# Patient Record
Sex: Female | Born: 1935 | Race: White | Hispanic: No | Marital: Married | Smoking: Never smoker
Health system: Southern US, Community
[De-identification: ages and names within clinical notes are randomized; demographics above are authoritative.]

## PROBLEM LIST (undated history)

## (undated) DIAGNOSIS — Z85820 Personal history of malignant melanoma of skin: Secondary | ICD-10-CM

## (undated) DIAGNOSIS — C439 Malignant melanoma of skin, unspecified: Secondary | ICD-10-CM

## (undated) DIAGNOSIS — I10 Essential (primary) hypertension: Secondary | ICD-10-CM

## (undated) DIAGNOSIS — C4491 Basal cell carcinoma of skin, unspecified: Secondary | ICD-10-CM

## (undated) DIAGNOSIS — R079 Chest pain, unspecified: Secondary | ICD-10-CM

## (undated) DIAGNOSIS — R5381 Other malaise: Secondary | ICD-10-CM

## (undated) DIAGNOSIS — I059 Rheumatic mitral valve disease, unspecified: Secondary | ICD-10-CM

## (undated) DIAGNOSIS — F419 Anxiety disorder, unspecified: Secondary | ICD-10-CM

## (undated) DIAGNOSIS — I7300 Raynaud's syndrome without gangrene: Secondary | ICD-10-CM

## (undated) DIAGNOSIS — IMO0002 Reserved for concepts with insufficient information to code with codable children: Secondary | ICD-10-CM

## (undated) DIAGNOSIS — F329 Major depressive disorder, single episode, unspecified: Secondary | ICD-10-CM

## (undated) DIAGNOSIS — F411 Generalized anxiety disorder: Secondary | ICD-10-CM

## (undated) DIAGNOSIS — I2510 Atherosclerotic heart disease of native coronary artery without angina pectoris: Secondary | ICD-10-CM

## (undated) DIAGNOSIS — R5383 Other fatigue: Secondary | ICD-10-CM

## (undated) DIAGNOSIS — E210 Primary hyperparathyroidism: Secondary | ICD-10-CM

## (undated) DIAGNOSIS — F32A Depression, unspecified: Secondary | ICD-10-CM

## (undated) DIAGNOSIS — I447 Left bundle-branch block, unspecified: Secondary | ICD-10-CM

## (undated) DIAGNOSIS — D649 Anemia, unspecified: Secondary | ICD-10-CM

## (undated) HISTORY — DX: Generalized anxiety disorder: F41.1

## (undated) HISTORY — DX: Left bundle-branch block, unspecified: I44.7

## (undated) HISTORY — DX: Other malaise: R53.81

## (undated) HISTORY — DX: Rheumatic mitral valve disease, unspecified: I05.9

## (undated) HISTORY — DX: Essential (primary) hypertension: I10

## (undated) HISTORY — DX: Anxiety disorder, unspecified: F41.9

## (undated) HISTORY — PX: SQUAMOUS CELL CARCINOMA EXCISION: SHX2433

## (undated) HISTORY — PX: MELANOMA EXCISION: SHX5266

## (undated) HISTORY — DX: Raynaud's syndrome without gangrene: I73.00

## (undated) HISTORY — PX: BASAL CELL CARCINOMA EXCISION: SHX1214

## (undated) HISTORY — DX: Personal history of malignant melanoma of skin: Z85.820

## (undated) HISTORY — DX: Other fatigue: R53.83

## (undated) HISTORY — DX: Chest pain, unspecified: R07.9

---
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
## 1958-12-04 HISTORY — PX: EYE SURGERY: SHX253

## 1998-07-19 ENCOUNTER — Other Ambulatory Visit: Admission: RE | Admit: 1998-07-19 | Discharge: 1998-07-19 | Payer: Self-pay | Admitting: Family Medicine

## 1999-07-25 ENCOUNTER — Other Ambulatory Visit: Admission: RE | Admit: 1999-07-25 | Discharge: 1999-07-25 | Payer: Self-pay | Admitting: Family Medicine

## 2000-02-27 ENCOUNTER — Encounter: Admission: RE | Admit: 2000-02-27 | Discharge: 2000-02-27 | Payer: Self-pay | Admitting: Family Medicine

## 2000-02-27 ENCOUNTER — Encounter: Payer: Self-pay | Admitting: Family Medicine

## 2000-08-20 ENCOUNTER — Other Ambulatory Visit: Admission: RE | Admit: 2000-08-20 | Discharge: 2000-08-20 | Payer: Self-pay | Admitting: Family Medicine

## 2001-06-27 ENCOUNTER — Encounter: Admission: RE | Admit: 2001-06-27 | Discharge: 2001-06-27 | Payer: Self-pay | Admitting: Family Medicine

## 2001-06-27 ENCOUNTER — Encounter: Payer: Self-pay | Admitting: Family Medicine

## 2001-08-26 ENCOUNTER — Other Ambulatory Visit: Admission: RE | Admit: 2001-08-26 | Discharge: 2001-08-26 | Payer: Self-pay | Admitting: Family Medicine

## 2001-09-06 ENCOUNTER — Encounter: Admission: RE | Admit: 2001-09-06 | Discharge: 2001-09-06 | Payer: Self-pay | Admitting: Family Medicine

## 2001-09-06 ENCOUNTER — Encounter: Payer: Self-pay | Admitting: Family Medicine

## 2002-08-29 ENCOUNTER — Other Ambulatory Visit: Admission: RE | Admit: 2002-08-29 | Discharge: 2002-08-29 | Payer: Self-pay | Admitting: Family Medicine

## 2004-09-02 ENCOUNTER — Other Ambulatory Visit: Admission: RE | Admit: 2004-09-02 | Discharge: 2004-09-02 | Payer: Self-pay | Admitting: Family Medicine

## 2004-12-08 ENCOUNTER — Inpatient Hospital Stay (HOSPITAL_COMMUNITY): Admission: EM | Admit: 2004-12-08 | Discharge: 2004-12-09 | Payer: Self-pay | Admitting: *Deleted

## 2004-12-08 DIAGNOSIS — R079 Chest pain, unspecified: Secondary | ICD-10-CM

## 2004-12-08 HISTORY — DX: Chest pain, unspecified: R07.9

## 2004-12-09 ENCOUNTER — Encounter: Payer: Self-pay | Admitting: Internal Medicine

## 2004-12-12 ENCOUNTER — Encounter: Payer: Self-pay | Admitting: Internal Medicine

## 2005-07-14 ENCOUNTER — Encounter: Admission: RE | Admit: 2005-07-14 | Discharge: 2005-07-14 | Payer: Self-pay | Admitting: Family Medicine

## 2005-09-21 ENCOUNTER — Encounter: Admission: RE | Admit: 2005-09-21 | Discharge: 2005-09-21 | Payer: Self-pay | Admitting: Family Medicine

## 2006-09-03 LAB — CONVERTED CEMR LAB: Pap Smear: NEGATIVE

## 2006-09-11 ENCOUNTER — Other Ambulatory Visit: Admission: RE | Admit: 2006-09-11 | Discharge: 2006-09-11 | Payer: Self-pay | Admitting: Family Medicine

## 2006-09-25 ENCOUNTER — Encounter: Admission: RE | Admit: 2006-09-25 | Discharge: 2006-09-25 | Payer: Self-pay | Admitting: Family Medicine

## 2007-09-12 LAB — CONVERTED CEMR LAB
AST: 17 units/L
Albumin: 4.4 g/dL
Basophils Relative: 0 %
CO2: 27 meq/L
Calcium: 10.3 mg/dL
Creatinine, Ser: 0.92 mg/dL
Eosinophils Relative: 3 %
Glucose, Bld: 109 mg/dL
Hemoglobin: 16.4 g/dL
Monocytes Relative: 9 %
Potassium: 4.7 meq/L
RBC: 4.88 M/uL
Total Bilirubin: 0.6 mg/dL
Triglyceride fasting, serum: 163 mg/dL
WBC: 5 10*3/uL

## 2009-01-04 ENCOUNTER — Encounter: Admission: RE | Admit: 2009-01-04 | Discharge: 2009-01-04 | Payer: Self-pay | Admitting: Family Medicine

## 2009-01-13 ENCOUNTER — Encounter: Payer: Self-pay | Admitting: Internal Medicine

## 2009-03-29 ENCOUNTER — Encounter: Payer: Self-pay | Admitting: Internal Medicine

## 2009-03-29 LAB — CONVERTED CEMR LAB
ALT: 11 units/L
Alkaline Phosphatase: 66 units/L
BUN: 17 mg/dL
Basophils Relative: 1 %
CRP: 1.2 mg/dL
Eosinophils Relative: 3 %
HDL: 52 mg/dL
Hgb A1c MFr Bld: 5.9 %
Homocysteine: 14 micromoles/L
LDL (calc): 27 mg/dL
LDL Cholesterol: 127 mg/dL
MCV: 98.5 fL
Monocytes Relative: 9 %
Platelets: 195 10*3/uL
TSH: 2.975 microintl units/mL
Triglyceride fasting, serum: 134 mg/dL

## 2009-04-05 ENCOUNTER — Encounter: Payer: Self-pay | Admitting: Internal Medicine

## 2009-04-12 ENCOUNTER — Encounter: Payer: Self-pay | Admitting: Internal Medicine

## 2009-04-12 ENCOUNTER — Encounter: Admission: RE | Admit: 2009-04-12 | Discharge: 2009-04-12 | Payer: Self-pay | Admitting: Family Medicine

## 2009-12-31 DIAGNOSIS — I059 Rheumatic mitral valve disease, unspecified: Secondary | ICD-10-CM | POA: Insufficient documentation

## 2009-12-31 DIAGNOSIS — I10 Essential (primary) hypertension: Secondary | ICD-10-CM | POA: Insufficient documentation

## 2009-12-31 DIAGNOSIS — F411 Generalized anxiety disorder: Secondary | ICD-10-CM | POA: Insufficient documentation

## 2009-12-31 DIAGNOSIS — H409 Unspecified glaucoma: Secondary | ICD-10-CM

## 2009-12-31 HISTORY — DX: Rheumatic mitral valve disease, unspecified: I05.9

## 2009-12-31 HISTORY — DX: Generalized anxiety disorder: F41.1

## 2009-12-31 HISTORY — DX: Essential (primary) hypertension: I10

## 2010-01-03 ENCOUNTER — Ambulatory Visit: Payer: Self-pay | Admitting: Internal Medicine

## 2010-01-03 DIAGNOSIS — I447 Left bundle-branch block, unspecified: Secondary | ICD-10-CM | POA: Insufficient documentation

## 2010-01-03 DIAGNOSIS — Z85820 Personal history of malignant melanoma of skin: Secondary | ICD-10-CM

## 2010-01-03 HISTORY — DX: Left bundle-branch block, unspecified: I44.7

## 2010-01-03 HISTORY — DX: Personal history of malignant melanoma of skin: Z85.820

## 2010-01-07 ENCOUNTER — Ambulatory Visit: Payer: Self-pay | Admitting: Internal Medicine

## 2010-01-07 LAB — CONVERTED CEMR LAB
AST: 19 units/L (ref 0–37)
Alkaline Phosphatase: 51 units/L (ref 39–117)
BUN: 14 mg/dL (ref 6–23)
Basophils Relative: 1 % (ref 0–1)
Bilirubin, Direct: 0.1 mg/dL (ref 0.0–0.3)
CO2: 30 meq/L (ref 19–32)
Creatinine, Ser: 0.8 mg/dL (ref 0.4–1.2)
Direct LDL: 153.9 mg/dL
Eosinophils Absolute: 0.2 10*3/uL (ref 0.0–0.7)
Eosinophils Relative: 3 % (ref 0–5)
GFR calc non Af Amer: 72.57 mL/min (ref >60)
HCT: 36.7 % (ref 36.0–46.0)
HDL: 44 mg/dL (ref >39.00)
Hemoglobin: 11.3 g/dL — ABNORMAL LOW (ref 12.0–15.0)
Lymphocytes Relative: 24 % (ref 12–46)
MCHC: 30.8 g/dL (ref 30.0–36.0)
Monocytes Absolute: 0.6 10*3/uL (ref 0.1–1.0)
Monocytes Relative: 11 % (ref 3–12)
Neutrophils Relative %: 62 % (ref 43–77)
Nitrite: NEGATIVE
Potassium: 4.5 meq/L (ref 3.5–5.1)
RBC: 3.58 M/uL — ABNORMAL LOW (ref 3.87–5.11)
RDW: 16.5 % — ABNORMAL HIGH (ref 11.5–15.5)
Sodium: 141 meq/L (ref 135–145)
TSH: 2.78 microintl units/mL (ref 0.35–5.50)
Total Bilirubin: 0.8 mg/dL (ref 0.3–1.2)
Total Protein: 7.4 g/dL (ref 6.0–8.3)
Urobilinogen, UA: 0.2 (ref 0.0–1.0)
WBC: 5.8 10*3/uL (ref 4.0–10.5)

## 2010-01-28 ENCOUNTER — Telehealth: Payer: Self-pay | Admitting: Internal Medicine

## 2010-12-02 ENCOUNTER — Ambulatory Visit
Admission: RE | Admit: 2010-12-02 | Discharge: 2010-12-02 | Payer: Self-pay | Source: Home / Self Care | Attending: Internal Medicine | Admitting: Internal Medicine

## 2010-12-02 DIAGNOSIS — N764 Abscess of vulva: Secondary | ICD-10-CM

## 2011-01-03 NOTE — Assessment & Plan Note (Signed)
Summary: NEW MEDICARE PT-JAN APPT-PKG--STC   Vital Signs:  Patient profile:   77 year old female Height:      66 inches (167.64 cm) Weight:      160.8 pounds (73.09 kg) O2 Sat:      97 % on Room air Temp:     98.2 degrees F (36.78 degrees C) oral Pulse rate:   76 / minute BP sitting:   144 / 78  (left arm) Cuff size:   regular  Vitals Entered By: Orlan Leavens (January 03, 2010 9:29 AM)  O2 Flow:  Room air CC: New patient/ transferring from dr. Artis Flock Is Patient Diabetic? No Pain Assessment Patient in pain? no        Primary Care Provider:  Newt Lukes MD  CC:  New patient/ transferring from dr. Artis Flock.  History of Present Illness: nw pt to me and our practice, here to est care patient is here today for annual physical. Patient feels well and has no complaints.   Preventive Screening-Counseling & Management  Alcohol-Tobacco     Alcohol drinks/day: <1     Alcohol Counseling: not indicated; use of alcohol is not excessive or problematic     Smoking Status: never     Tobacco Counseling: not indicated; no tobacco use  Caffeine-Diet-Exercise     Does Patient Exercise: no     Exercise Counseling: to improve exercise regimen     Depression Counseling: not indicated; screening negative for depression  Safety-Violence-Falls     Seat Belt Use: yes     Firearms in the Home: no firearms in the home     Smoke Detectors: yes     Violence in the Home: no risk noted     Fall Risk Counseling: not indicated; no significant falls noted  Clinical Review Panels:  Prevention   Last Mammogram:  [facility].   No specific mammographic evidence of malignancy.  Assessment: BIRADS 1. (04/12/2009)   Last Pap Smear:  Interpretation/Result:Negative for intraepithelial Lesion or Malignancy.    (09/03/2006)  Immunizations   Last Tetanus Booster:  Historical (08/05/2003)   Last Flu Vaccine:  Historical (09/03/2009)   Last Pneumovax:  Historical  (09/03/1992)   Last Zoster Vaccine:  Zostavax (05/05/2007)  Lipid Management   Cholesterol:  206 (03/29/2009)   LDL (bad choesterol):  127 (03/29/2009)   HDL (good cholesterol):  52 (03/29/2009)   Triglycerides:  134 (03/29/2009)  CBC   WBC:  6.3 (03/29/2009)   RBC:  4.75 (03/29/2009)   Hgb:  16.2 (03/29/2009)   Hct:  46.8 (03/29/2009)   Platelets:  195 (03/29/2009)   MCV  98.5 (03/29/2009)   RDW  14.9 (03/29/2009)   PMN:  64 (03/29/2009)   Monos:  9 (03/29/2009)   Eosinophils:  3 (03/29/2009)   Basophil:  1 (03/29/2009)  Complete Metabolic Panel   Glucose:  105 (03/29/2009)   Sodium:  140 (03/29/2009)   Potassium:  4.2 (03/29/2009)   Chloride:  104 (03/29/2009)   CO2:  27 (03/29/2009)   BUN:  17 (03/29/2009)   Creatinine:  0.91 (03/29/2009)   Albumin:  4.5 (03/29/2009)   Total Protein:  7.6 (03/29/2009)   Calcium:  10.0 (03/29/2009)   Total Bili:  0.5 (03/29/2009)   Alk Phos:  66 (03/29/2009)   SGPT (ALT):  11 (03/29/2009)   SGOT (AST):  16 (03/29/2009)   -  Date:  03/29/2009    WBC: 6.3    HGB: 16.2    HCT: 46.8  RBC: 4.75    PLT: 195    MCV: 98.5    RDW: 14.9    Neutrophil: 64    Lymphs: 23    Monos: 9    Eos: 3    Basophil: 1    BG Random: 105    BUN: 17    Creatinine: 0.91    Sodium: 140    Potassium: 4.2    Chloride: 104    CO2 Total: 27    SGOT (AST): 16    SGPT (ALT): 11    T. Bilirubin: 0.5    Alk Phos: 66    Calcium: 10.0    Total Protein: 7.6    Albumin: 4.5    Cholesterol: 206    LDL: 127    LDL-calculated: 27    HDL: 52    Triglycerides: 562    TSH: 2.975    C-reactive protein: 1.2    Homocysteine: 14.0    HgbA1c: 5.9  Date:  09/12/2007    WBC: 5.0    HGB: 16.4    HCT: 47.9    RBC: 4.88    PLT: 187    MCV: 98.2    RDW: 13.9    Neutrophil: 61    Lymphs: 28    Monos: 9    Eos: 3    Basophil: 0    BG Random: 109    BUN: 16    Creatinine: 0.92    Sodium: 140    Potassium: 4.7    Chloride: 104    CO2 Total:  27    SGOT (AST): 17    SGPT (ALT): 13    T. Bilirubin: 0.6    Alk Phos: 63    Calcium: 10.3    Total Protein: 7.4    Albumin: 4.4    Cholesterol: 214    LDL: 128    LDL-calculated: 33    HDL: 53    Triglycerides: 130    TSH: 2.684  Current Medications (verified): 1)  Norvasc 5 Mg Tabs (Amlodipine Besylate) .... Take 1 By Mouth Q Am 2)  Lisinopril-Hydrochlorothiazide 20-25 Mg Tabs (Lisinopril-Hydrochlorothiazide) .... Take 1 By Mouth Qd 3)  Lisinopril-Hydrochlorothiazide 10-12.5 Mg Tabs (Lisinopril-Hydrochlorothiazide) .... Take 1 Q Am 4)  Lumigan 0.03 % Soln (Bimatoprost) .[name] Kitchen.. 1 Drop in Each Eye Q Am 5)  Fish Oil 1000 Mg Caps (Omega-3 Fatty Acids) .... Once Daily 6)  Vitamin E 1000 Unit Caps (Vitamin E) .... Once Daily 7)  Dorzolamide Hcl-Timolol Mal 22.3-6.8 Mg/ml Soln (Dorzolamide Hcl-Timolol Mal) .[name] Kitchen.. 1 Drop in Each Eye Q 12 Hours 8)  Vitamin D3 1000 Unit Tabs (Cholecalciferol) .... Once Daily 9)  Grape Seed 50 Mg Caps (Grape Seed) .... Take 2 By Mouth Qd 10)  Verelan 120 Mg Xr24h-Cap (Verapamil Hcl) .... Take 1 At Bedtime  Allergies (verified): 1)  ! Codeine  Past History:  Past Medical History: Anxiety Hypertension glaucoma  MD rooster: cards Tresa Endo (remote -2006 cath) optho [facility]  Past Surgical History: Tonsillectomy (1934) Breast biopsy (1950) eye surgery (1960)  Family History: Family History Hypertension (parent) Family History Kidney disease (father) Heart disease (grandparent, mother relative)  mother died at age 27.  father dies at 37 from bright disease.  No living siblings. She had one brother die from lung cancer and one sister died in her 18's from heart attack.  Social History: Never Smoked married, lives with spouse (2nd marriage in 2009, 1st widowed in 1979) enjoys golf, not much aerobic activity- spends time in  [place] part of the year Smoking Status:  never Does Patient Exercise:  no Seat Belt Use:  yes  Review of Systems        see HPI above. I have reviewed all other systems and they were negative.   Physical Exam  General:  alert, well-developed, well-nourished, and cooperative to examination.   active and spry for age Eyes:  vision grossly intact; pupils equal, round and reactive to light.  conjunctiva and lids normal.    Ears:  normal pinnae bilaterally, without erythema, swelling, or tenderness to palpation. TMs clear, without effusion, or cerumen impaction. Hearing grossly normal bilaterally  Mouth:  teeth and gums in good repair; mucous membranes moist, without lesions or ulcers. oropharynx clear without exudate, no erythema.  Lungs:  normal respiratory effort, no intercostal retractions or use of accessory muscles; normal breath sounds bilaterally - no crackles and no wheezes.    Heart:  normal rate, regular rhythm, no murmur, and no rub. BLE without edema.  Abdomen:  soft, non-tender, normal bowel sounds, no distention; no masses and no appreciable hepatomegaly or splenomegaly.   Rectal:  No external abnormalities noted. Normal sphincter tone. No rectal masses or tenderness. Heme neg FOB x 1 Msk:  No deformity or scoliosis noted of thoracic or lumbar spine.   Neurologic:  alert & oriented X3 and cranial nerves II-XII symetrically intact.  strength normal in all extremities, sensation intact to light touch, and gait normal. speech fluent without dysarthria or aphasia; follows commands with good comprehension.  Skin:  no rashes, vesicles, ulcers, or erythema. No nodules or irregularity to palpation.  Psych:  Oriented X3, memory intact for recent and remote, normally interactive, good eye contact, not anxious appearing, not depressed appearing, and not agitated.      Impression & Recommendations:  Problem # 1:  PREVENTIVE HEALTH CARE (ICD-V70.0)  Patient has been counseled on age-appropriate routine health concerns for screening and prevention. These are reviewed and up-to-date. Immunizations are up-to-date  or declined. Labs ordered and to be reviewed. ECG and colo declined  Orders: Hemoccult Guaiac-1 spec.(in office) (82270)  Problem # 2:  HYPERTENSION (ICD-401.9)  The following medications were removed from the medication list:    Lisinopril-hydrochlorothiazide 20-25 Mg Tabs (Lisinopril-hydrochlorothiazide) .[name] Kitchen... Take 1 by mouth qd Her updated medication list for this problem includes:    Verelan 120 Mg Xr24h-cap (Verapamil hcl) .[name] Kitchen... Take 1 at bedtime    Norvasc 5 Mg Tabs (Amlodipine besylate) .[name] Kitchen... Take 1 by mouth q am    Lisinopril-hydrochlorothiazide 10-12.5 Mg Tabs (Lisinopril-hydrochlorothiazide) .[name] Kitchen... Take 1 by mouth once daily  BP today: 144/78  Complete Medication List: 1)  Verelan 120 Mg Xr24h-cap (Verapamil hcl) .... Take 1 at bedtime 2)  Norvasc 5 Mg Tabs (Amlodipine besylate) .... Take 1 by mouth q am 3)  Lisinopril-hydrochlorothiazide 10-12.5 Mg Tabs (Lisinopril-hydrochlorothiazide) .... Take 1 by mouth once daily 4)  Lumigan 0.03 % Soln (Bimatoprost) .[name] Kitchen.. 1 drop in each eye q am 5)  Fish Oil 1000 Mg Caps (Omega-3 fatty acids) .... Once daily 6)  Vitamin E 1000 Unit Caps (Vitamin e) .... Once daily 7)  Dorzolamide Hcl-timolol Mal 22.3-6.8 Mg/ml Soln (Dorzolamide hcl-timolol mal) .[name] Kitchen.. 1 drop in each eye q 12 hours 8)  Vitamin D3 1000 Unit Tabs (Cholecalciferol) .... Once daily 9)  Grape Seed 50 Mg Caps (Grape seed) .... Take 2 by mouth qd 10)  Align Caps (Probiotic product) .[name] Kitchen.. 1 by mouth once daily  Contraindications/Deferment of Procedures/Staging:    Test/Procedure: Colonoscopy  Reason for deferment: patient declined   Patient Instructions: 1)  it was good to see you today.  2)  please return for physical labs when you are fasting - your results wil thenl be posted on the phone tree for review in 48-72 hours from the time of test completion; call [phone] and enter your 9 digit MRN (listed above on this page, just below your name); if any changes need to be  made or there are abnormal results, you will be contacted directly.  3)  it is important that you work on losing weight - monitor your diet and consume fewer calories such as less carbohydrates (sugar) and less fat. you also need to increase your physical activity level - start by walking for 10-20 minutes 3 times per week and work up to 30 minutes 4-5 times each week.  4)  Please schedule a follow-up appointment annually for your physical, call sooner if problems.    Immunization History:  Tetanus/Td Immunization History:    Tetanus/Td:  historical (08/05/2003)  Influenza Immunization History:    Influenza:  historical (09/03/2009)  Pneumovax Immunization History:    Pneumovax:  historical (09/03/1992)  Zostavax History:    Zostavax # 1:  zostavax (05/05/2007)    Mammogram  Procedure date:  09/03/2006  Findings:      No specific mammographic evidence of malignancy.    Bone Density  Procedure date:  09/03/2005  Findings:      results: Osteopenia  Pap Smear  Procedure date:  09/03/2006  Findings:      Interpretation/Result:Negative for intraepithelial Lesion or Malignancy.     Bone Density  Procedure date:  04/12/2009  Findings:      Location:  [facility].   Lumbar spine BMD 0.937 T-score -1.0 Z-score- 1.8  Neck BMD 0.713 T-score--1.2 Z-score- 1.2  assessment: Low bone mass   Mammogram  Procedure date:  04/12/2009  Findings:      Location: [facility].   No specific mammographic evidence of malignancy.  Assessment: BIRADS 1.   MISC. Report  Procedure date:  04/05/2009  Findings:      Type of Report: Pulmonary Function test. Impression: Normal spirometry   Exercise Stress Test  Procedure date:  12/12/2004  Findings:      done @ [facility]  Impression: Relatively normal persantine cardiolite myocardial perfusion and functional/dynamic study. there is evidence for minimal  distal inferolateral thinning which is not felt to be significant. There is vigorous LV contractility without wall motion abnormalties. No region of ischemia. there is a low risk scan

## 2011-01-03 NOTE — Progress Notes (Signed)
  Phone Note From Pharmacy   Caller: [facility] Summary of Call: pharmacy called stating that pt just got last refills of Lisinopril, Amplodipine and Verapamil Rx'd from Dr. Artis Flock. pt is requesting MD renew Rx  Initial call taken by: Margaret Pyle, CMA,  January 28, 2010 11:32 AM    Prescriptions: NORVASC 5 MG TABS (AMLODIPINE BESYLATE) take 1 by mouth q am  #90 x 3   Entered by:   Margaret Pyle, CMA   Authorized by:   Newt Lukes MD   Signed by:   Margaret Pyle, CMA on 01/28/2010   Method used:   Electronically to        [facility] (retail)       [address]       [place], [place]  [id number]       Ph: [phone]       Fax: [phone]   RxID:   [id number] LISINOPRIL-HYDROCHLOROTHIAZIDE 10-12.5 MG TABS (LISINOPRIL-HYDROCHLOROTHIAZIDE) take 1 by mouth once daily  #90 x 3   Entered by:   Margaret Pyle, CMA   Authorized by:   Newt Lukes MD   Signed by:   Margaret Pyle, CMA on 01/28/2010   Method used:   Electronically to        [facility] (retail)       [address]       [place], [place]  [id number]       Ph: [phone]       Fax: [phone]   RxID:   [id number] VERELAN 120 MG XR24H-CAP (VERAPAMIL HCL) take 1 at bedtime  #90 x 3   Entered by:   Margaret Pyle, CMA   Authorized by:   Newt Lukes MD   Signed by:   Margaret Pyle, CMA on 01/28/2010   Method used:   Electronically to        [facility] (retail)       [address]       [place], [place]  [id number]       Ph: [phone]       Fax: [phone]   RxID:   [id number]

## 2011-01-03 NOTE — Therapy (Signed)
Summary: Bradd Canary MD  Bradd Canary MD   Imported By: Lester Primrose 01/06/2010 08:05:16  _____________________________________________________________________  External Attachment:    Type:   Image     Comment:   External Document

## 2011-01-03 NOTE — Cardiovascular Report (Signed)
Summary: Judene Companion MD  Judene Companion MD   Imported By: Lester [place]port 01/06/2010 07:51:32  _____________________________________________________________________  External Attachment:    Type:   Image     Comment:   External Document

## 2011-01-05 NOTE — Assessment & Plan Note (Signed)
Summary: lump groin area/cd   Vital Signs:  Patient profile:   77 year old female Height:      66 inches Weight:      159.50 pounds BMI:     25.84 O2 Sat:      97 % on Room air Temp:     98.4 degrees F oral Pulse rate:   78 / minute BP sitting:   138 / 82  (left arm) Cuff size:   regular  Vitals Entered By: Margaret Pyle, CMA (December 02, 2010 11:07 AM)  O2 Flow:  Room air  Procedure Note Last Tetanus: Historical (08/05/2003)  Incision & Drainage: The patient complains of pain, redness, inflammation, tenderness, swelling, and changing lesion but denies foreign body, discharge, and fever. Date of onset: 11/28/2010 Indication: infected lesion Consent signed: no  Procedure # 1: I & D with packing    Size (in cm): 1.2 x 1.1    Region: left vulva    Comment: pt elects to proceed after verbal consent obtained. pt informed of possible risks and complications. using sterile technique throughout, anesthesia achieved and I&D of abscess performed. copious amounts of purulent material expressed, followed by thorough irrigation of wound. iodoform packing placed into wound. pt tolerated procedure well. wound care instructions given.     Instrument used: #15 blade    Anesthesia: 2.0 ml 2% lidocaine w/o epinephrine  CC: Boil, LT upper/inner thigh Pain Assessment Patient in pain? no        Primary Care Provider:  Newt Lukes MD  CC:  Boil and LT upper/inner thigh.  History of Present Illness: c/o nodule in left inner thigh, ?boil onset 3 days ago (?when 1st noted but may have been present longer) a/w tenderness to touch, no drainage, no fever reports inc in size since onset noted no hx same - no shaving in this area reports she is unable to tolerate oral abx due to severe nausea  Allergies: 1)  ! Codeine  Past History:  Past Medical History: Anxiety Hypertension glaucoma  MD roster: cards - Tresa Endo (remote -2006 cath) optho - [place]  Family  History: Family History Hypertension (parent) Family History Kidney disease (father) Heart disease (grandparent, mother relative)   mother died at age 42.  father dies at 57 from bright disease.  No living siblings. She had one brother die from lung cancer and one sister died in her 72's from heart attack.  Social History: Never Smoked married, lives with spouse (2nd marriage in 2009, 1st widowed in 1979) enjoys golf, not much aerobic activity-  spends time in [place] part of the year  Review of Systems  The patient denies fever, weight loss, hematuria, muscle weakness, and depression.    Physical Exam  General:  alert, well-developed, well-nourished, and cooperative to examination.   active and spry for age Lungs:  normal respiratory effort, no intercostal retractions or use of accessory muscles; normal breath sounds bilaterally - no crackles and no wheezes.    Heart:  normal rate, regular rhythm, no murmur, and no rub. BLE without edema.  Skin:  small abscess 1cm nodule on left vulva - no tract or cellulitis - other skin benign   Impression & Recommendations:  Problem # 1:  ABSCESS, VULVA (ICD-616.4)  i&d today due to pt reported intol of abx - rx for septra provided and encouraged to use as tol -  otherwise wound care instructions for i&d as routine provided  Orders: I&D Abscess, Complex (10061)  Problem # 2:  HYPERTENSION (ICD-401.9)  Her updated medication list for this problem includes:    Verelan 120 Mg Xr24h-cap (Verapamil hcl) .[name] Kitchen... Take 1 at bedtime    Norvasc 5 Mg Tabs (Amlodipine besylate) .[name] Kitchen... Take 1 by mouth q am    Lisinopril-hydrochlorothiazide 10-12.5 Mg Tabs (Lisinopril-hydrochlorothiazide) .[name] Kitchen... Take 1 by mouth once daily  BP today: 138/82 Prior BP: 144/78 (01/03/2010)  Labs Reviewed: K+: 4.5 (01/07/2010) Creat: : 0.8 (01/07/2010)   Chol: 216 (01/07/2010)   HDL: 44.00 (01/07/2010)   LDL: 27 (03/29/2009)   TG: 139.0 (01/07/2010)  Complete  Medication List: 1)  Verelan 120 Mg Xr24h-cap (Verapamil hcl) .... Take 1 at bedtime 2)  Norvasc 5 Mg Tabs (Amlodipine besylate) .... Take 1 by mouth q am 3)  Lisinopril-hydrochlorothiazide 10-12.5 Mg Tabs (Lisinopril-hydrochlorothiazide) .... Take 1 by mouth once daily 4)  Lumigan 0.03 % Soln (Bimatoprost) .[name] Kitchen.. 1 drop in each eye q am 5)  Dorzolamide Hcl-timolol Mal 22.3-6.8 Mg/ml Soln (Dorzolamide hcl-timolol mal) .[name] Kitchen.. 1 drop in each eye q 12 hours 6)  Grape Seed 50 Mg Caps (Grape seed) .... Take 2 by mouth qd 7)  Septra Ds 800-160 Mg Tabs (Sulfamethoxazole-trimethoprim) .[name] Kitchen.. 1 by mouth two times a day x 5 days  Patient Instructions: 1)  it was good to see you today. 2)  your boil has been opened, drained, cleaned and packed - remove packing in 24 hours 3)  keep area washed with warm soap and water 2x/day after removal of packing until healed -  4)  septra antibioitcs 2x/d for 5days - prescription given to you today - TAKE WITH FOOD 5)  if any increased pain, drainage, redness or other problems, call us - may need reevaluation or antibioitcs 6)  Please schedule a follow-up appointment as needed (keep scheduled physical appt in Feb 2012 as planned), call sooner if problems Prescriptions: SEPTRA DS 800-160 MG TABS (SULFAMETHOXAZOLE-TRIMETHOPRIM) 1 by mouth two times a day x 5 days  #10 x 0   Entered and Authorized by:   Newt Lukes MD   Signed by:   Newt Lukes MD on 12/02/2010   Method used:   Print then Give to Patient   RxID:   [id number]    Orders Added: 1)  Est. Patient Level IV [14782] 2)  I&D Abscess, Complex [10061]   Immunization History:  Influenza Immunization History:    Influenza:  historical (08/04/2010)   Immunization History:  Influenza Immunization History:    Influenza:  Historical (08/04/2010)

## 2011-01-09 ENCOUNTER — Encounter: Payer: Self-pay | Admitting: Internal Medicine

## 2011-01-09 ENCOUNTER — Other Ambulatory Visit: Payer: Medicare Other

## 2011-01-09 ENCOUNTER — Ambulatory Visit (INDEPENDENT_AMBULATORY_CARE_PROVIDER_SITE_OTHER): Payer: Medicare Other | Admitting: Internal Medicine

## 2011-01-09 ENCOUNTER — Other Ambulatory Visit: Payer: Self-pay | Admitting: Internal Medicine

## 2011-01-09 DIAGNOSIS — R5381 Other malaise: Secondary | ICD-10-CM | POA: Insufficient documentation

## 2011-01-09 DIAGNOSIS — R5383 Other fatigue: Secondary | ICD-10-CM

## 2011-01-09 DIAGNOSIS — I10 Essential (primary) hypertension: Secondary | ICD-10-CM

## 2011-01-09 DIAGNOSIS — F411 Generalized anxiety disorder: Secondary | ICD-10-CM

## 2011-01-09 DIAGNOSIS — Z0000 Encounter for general adult medical examination without abnormal findings: Secondary | ICD-10-CM

## 2011-01-09 DIAGNOSIS — E785 Hyperlipidemia, unspecified: Secondary | ICD-10-CM

## 2011-01-09 DIAGNOSIS — I447 Left bundle-branch block, unspecified: Secondary | ICD-10-CM

## 2011-01-09 HISTORY — DX: Other malaise: R53.81

## 2011-01-09 LAB — BASIC METABOLIC PANEL
BUN: 15 mg/dL (ref 6–23)
Calcium: 10.4 mg/dL (ref 8.4–10.5)
GFR: 68.43 mL/min (ref >60.00)

## 2011-01-09 LAB — CBC WITH DIFFERENTIAL/PLATELET
Hemoglobin: 14.4 g/dL (ref 12.0–15.0)
Lymphocytes Relative: 20.4 % (ref 12.0–46.0)
MCHC: 34.4 g/dL (ref 30.0–36.0)
Neutro Abs: 5.1 10*3/uL (ref 1.4–7.7)
WBC: 7.4 10*3/uL (ref 4.5–10.5)

## 2011-01-09 LAB — HEPATIC FUNCTION PANEL
Bilirubin, Direct: 0.1 mg/dL (ref 0.0–0.3)
Total Protein: 7.1 g/dL (ref 6.0–8.3)

## 2011-01-09 LAB — LIPID PANEL
HDL: 49.1 mg/dL (ref >39.00)
Triglycerides: 101 mg/dL (ref 0.0–149.0)
VLDL: 20.2 mg/dL (ref 0.0–40.0)

## 2011-01-09 LAB — LDL CHOLESTEROL, DIRECT: Direct LDL: 147 mg/dL

## 2011-01-11 ENCOUNTER — Other Ambulatory Visit: Payer: Self-pay | Admitting: Internal Medicine

## 2011-01-11 DIAGNOSIS — Z1231 Encounter for screening mammogram for malignant neoplasm of breast: Secondary | ICD-10-CM

## 2011-01-19 NOTE — Assessment & Plan Note (Signed)
Summary: yearly stc   Vital Signs:  Patient profile:   77 year old female Height:      66 inches (167.64 cm) Weight:      159.0 pounds (72.27 kg) O2 Sat:      97 % on Room air Temp:     97.8 degrees F (36.56 degrees C) oral Pulse rate:   72 / minute BP sitting:   132 / 70  (left arm) Cuff size:   regular  Vitals Entered By: Orlan Leavens RMA (January 09, 2011 9:49 AM)  O2 Flow:  Room air CC: yearly f/u Is Patient Diabetic? No Pain Assessment Patient in pain? no        Primary Care Provider:  Newt Lukes MD  CC:  yearly f/u.  History of Present Illness: patient is here today for annual wellness visit. Patient feels well and has no complaints.  chronic LBBB - no change  also reviewed chronic med issues: HTN - reports compliance with ongoing medical treatment and no changes in medication dose or frequency. denies adverse side effects related to current therapy.   anxiety - occ falres but controls symptoms by staying active - no counseling or recent stressors  Preventive Screening-Counseling & Management  Alcohol-Tobacco     Alcohol drinks/day: <1     Alcohol Counseling: not indicated; use of alcohol is not excessive or problematic     Smoking Status: never     Tobacco Counseling: not indicated; no tobacco use  Caffeine-Diet-Exercise     Does Patient Exercise: no     Exercise Counseling: to improve exercise regimen     Depression Counseling: not indicated; screening negative for depression  Safety-Violence-Falls     Seat Belt Use: yes     Firearms in the Home: no firearms in the home     Smoke Detectors: yes     Violence in the Home: no risk noted     Fall Risk Counseling: not indicated; no significant falls noted  Clinical Review Panels:  Prevention   Last Mammogram:  [facility].   No specific mammographic evidence of malignancy.  Assessment: BIRADS 1. (04/12/2009)   Last Pap Smear:  Interpretation/Result:Negative for  intraepithelial Lesion or Malignancy.    (09/03/2006)  Immunizations   Last Tetanus Booster:  Historical (08/05/2003)   Last Flu Vaccine:  Historical (08/04/2010)   Last Pneumovax:  Historical (09/03/1992)   Last Zoster Vaccine:  Zostavax (05/05/2007)  Lipid Management   Cholesterol:  216 (01/07/2010)   LDL (bad choesterol):  127 (03/29/2009)   HDL (good cholesterol):  44.00 (01/07/2010)   Triglycerides:  134 (03/29/2009)  Diabetes Management   HgBA1C:  5.9 (03/29/2009)   Creatinine:  0.8 (01/07/2010)   Last Flu Vaccine:  Historical (08/04/2010)   Last Pneumovax:  Historical (09/03/1992)  CBC   WBC:  5.8 (01/07/2010)   RBC:  3.58 (01/07/2010)   Hgb:  11.3 (01/07/2010)   Hct:  36.7 (01/07/2010)   Platelets:  246 (01/07/2010)   MCV  102.5 (01/07/2010)   MCHC  30.8 (01/07/2010)   RDW  16.5 (01/07/2010)   PMN:  62 (01/07/2010)   Lymphs:  24 (01/07/2010)   Monos:  11 (01/07/2010)   Eosinophils:  3 (01/07/2010)   Basophil:  1 (01/07/2010)  Complete Metabolic Panel   Glucose:  96 (01/07/2010)   Sodium:  141 (01/07/2010)   Potassium:  4.5 (01/07/2010)   Chloride:  106 (01/07/2010)   CO2:  30 (01/07/2010)   BUN:  14 (01/07/2010)   Creatinine:  0.8 (01/07/2010)   Albumin:  3.8 (01/07/2010)   Total Protein:  7.4 (01/07/2010)   Calcium:  10.0 (01/07/2010)   Total Bili:  0.8 (01/07/2010)   Alk Phos:  51 (01/07/2010)   SGPT (ALT):  20 (01/07/2010)   SGOT (AST):  19 (01/07/2010)   Current Medications (verified): 1)  Verelan 120 Mg Xr24h-Cap (Verapamil Hcl) .... Take 1 At Bedtime 2)  Norvasc 5 Mg Tabs (Amlodipine Besylate) .... Take 1 By Mouth Q Am 3)  Lisinopril-Hydrochlorothiazide 10-12.5 Mg Tabs (Lisinopril-Hydrochlorothiazide) .... Take 1 By Mouth Once Daily 4)  Lumigan 0.03 % Soln (Bimatoprost) .[name] Kitchen.. 1 Drop in Each Eye Q Am 5)  Dorzolamide Hcl-Timolol Mal 22.3-6.8 Mg/ml Soln (Dorzolamide Hcl-Timolol Mal) .[name] Kitchen.. 1 Drop in Each Eye Q 12 Hours 6)  Grape Seed 50 Mg Caps  (Grape Seed) .... Take 2 By Mouth Qd  Allergies (verified): 1)  ! Codeine  Past History:  Past Medical History: Anxiety Hypertension glaucoma  MD roster: cards Tresa Endo (remote -2006 cath) optho Eulah Pont    Past Surgical History: Tonsillectomy (1934) Breast biopsy (1950)  eye surgery (1960)  Family History: Family History Hypertension (parent) Family History Kidney disease (father) Heart disease (grandparent, mother relative)    mother died at age 8.  father dies at 66 from bright disease.  No living siblings. She had one brother die from lung cancer and one sister died in her 60's from heart attack.  Social History: Never Smoked  married, lives with spouse (2nd marriage in 2009, 1st widowed in 1979) enjoys golf, not much aerobic activity-  spends time in [place] part of the year  Review of Systems       feels tired, nonexertional not motivationsal - (fatigue), no weight loss, no CP, no SOB no weakness or falls - otherwise, see HPI above. I have reviewed all other systems and they were negative.   Physical Exam  General:  alert, well-developed, well-nourished, and cooperative to examination.   active and spry for age Head:  Normocephalic and atraumatic without obvious abnormalities. No apparent alopecia or balding. Eyes:  vision grossly intact; pupils equal, round and reactive to light.  conjunctiva and lids normal.    Ears:  normal pinnae bilaterally, without erythema, swelling, or tenderness to palpation. TMs clear, without effusion, or cerumen impaction. Hearing grossly normal bilaterally  Mouth:  teeth and gums in good repair; mucous membranes moist, without lesions or ulcers. oropharynx clear without exudate, no erythema.  Neck:  supple, full ROM, no masses, no thyromegaly; no thyroid nodules or tenderness. no JVD or carotid bruits.   Breasts:  No mass, nodules, thickening, tenderness, bulging, retraction, inflamation, nipple discharge or skin changes noted.     Lungs:  normal respiratory effort, no intercostal retractions or use of accessory muscles; normal breath sounds bilaterally - no crackles and no wheezes.    Heart:  normal rate, regular rhythm, no murmur, and no rub. BLE without edema.  Abdomen:  soft, non-tender, normal bowel sounds, no distention; no masses and no appreciable hepatomegaly or splenomegaly.   Genitalia:  defer due to age Msk:  No deformity or scoliosis noted of thoracic or lumbar spine.   Neurologic:  alert & oriented X3 and cranial nerves II-XII symetrically intact.  strength normal in all extremities, sensation intact to light touch, and gait normal. speech fluent without dysarthria or aphasia; follows commands with good comprehension.  Skin:  no rashes, vesicles, ulcers, or erythema. No nodules or irregularity to  palpation.  Psych:  Oriented X3, memory intact for recent and remote, normally interactive, good eye contact, not anxious appearing, not depressed appearing, and not agitated.      Impression & Recommendations:  Problem # 1:  PREVENTIVE HEALTH CARE (ICD-V70.0)  I have personally reviewed and have noted 1.   The patient's medical and social history 2.   Their use of alcohol, tobacco or illicit drugs 3.   Their current medications and supplements 4.   The patient's functional ability including ADL's, fall risks, home safety risks and hearing or visual impairment. 5.   Diet and physical activities 6.   Evidence for depression or mood disorders  The patients weight, height, BMI and visual acuity have been recorded in the chart I have made referrals, counseling and provided education to the patient based review of the above and I have provided the pt with a written personalized care plan for preventive services.    Orders: Misc. Referral (Misc. Ref)  Problem # 2:  HYPERTENSION (ICD-401.9)  Her updated medication list for this problem includes:    Verelan 120 Mg Xr24h-cap (Verapamil hcl) .[name] Kitchen... Take 1 at  bedtime    Norvasc 5 Mg Tabs (Amlodipine besylate) .[name] Kitchen... Take 1 by mouth q am    Lisinopril-hydrochlorothiazide 10-12.5 Mg Tabs (Lisinopril-hydrochlorothiazide) .[name] Kitchen... Take 1 by mouth once daily  Orders: EKG w/ Interpretation (93000) TLB-BMP (Basic Metabolic Panel-BMET) (80048-METABOL) TLB-Lipid Panel (80061-LIPID)  BP today: 132/70 Prior BP: 144/78 (01/03/2010) Prior BP: 138/82 (12/02/2010)   Labs Reviewed: K+: 4.5 (01/07/2010) Creat: : 0.8 (01/07/2010)   Chol: 216 (01/07/2010)   HDL: 44.00 (01/07/2010)   LDL: 27 (03/29/2009)   TG: 139.0 (01/07/2010)  Problem # 3:  FATIGUE (ICD-780.79) nonsp hx and exam - check labs now Orders: TLB-TSH (Thyroid Stimulating Hormone) (84443-TSH) TLB-CBC Platelet - w/Differential (85025-CBCD) TLB-Hepatic/Liver Function Pnl (80076-HEPATIC)  Problem # 4:  LEFT BUNDLE BRANCH BLOCK (ICD-426.3) chronic - no change  Problem # 5:  ANXIETY (ICD-300.00)  Orders: TLB-TSH (Thyroid Stimulating Hormone) (84443-TSH)  Complete Medication List: 1)  Verelan 120 Mg Xr24h-cap (Verapamil hcl) .... Take 1 at bedtime 2)  Norvasc 5 Mg Tabs (Amlodipine besylate) .... Take 1 by mouth q am 3)  Lisinopril-hydrochlorothiazide 10-12.5 Mg Tabs (Lisinopril-hydrochlorothiazide) .... Take 1 by mouth once daily 4)  Lumigan 0.03 % Soln (Bimatoprost) .[name] Kitchen.. 1 drop in each eye q am 5)  Dorzolamide Hcl-timolol Mal 22.3-6.8 Mg/ml Soln (Dorzolamide hcl-timolol mal) .[name] Kitchen.. 1 drop in each eye q 12 hours 6)  Calcium 600 Mg Tabs (Calcium) .[name] Kitchen.. 1 by mouth two times a day 7)  Vitamin D3 1000 Unit Caps (Cholecalciferol) .[name] Kitchen.. 1 by mouth once daily 8)  Ipratropium Bromide 0.06 % Soln (Ipratropium bromide) .... Nasal spray for runny nose symptoms -use as directed (as needed)  Patient Instructions: 1)  it was good to see you today. 2)  exam and EKG today are good; medications reviewed today - no changes  3)  we'll make referral for screening mammography. Our office will contact you regarding  this appointment once made.  4)  test(s) ordered today - your results will be posted on the phone tree for review in 48-72 hours from the time of test completion; call [phone] and enter your 9 digit MRN (listed above on this page, just below your name); if any changes need to be made or there are abnormal results, you will be contacted directly.  5)  Please schedule a follow-up appointment in 6 months to monitor blood pressure and medications,  call sooner if problems.  Prescriptions: LISINOPRIL-HYDROCHLOROTHIAZIDE 10-12.5 MG TABS (LISINOPRIL-HYDROCHLOROTHIAZIDE) take 1 by mouth once daily  #90 x 3   Entered by:   Orlan Leavens RMA   Authorized by:   Newt Lukes MD   Signed by:   Orlan Leavens RMA on 01/09/2011   Method used:   Electronically to        [facility] (retail)       [address]       [place], [place]  [address]       Ph: [phone]       Fax: [phone]   RxID:   [id number] NORVASC 5 MG TABS (AMLODIPINE BESYLATE) take 1 by mouth q am  #90 x 3   Entered by:   Orlan Leavens RMA   Authorized by:   Newt Lukes MD   Signed by:   Orlan Leavens RMA on 01/09/2011   Method used:   Electronically to        [facility] (retail)       [address]       [place], [place]  [address]       Ph: [phone]       Fax: [phone]   RxID:   [id number] VERELAN 120 MG XR24H-CAP (VERAPAMIL HCL) take 1 at bedtime  #90 x 3   Entered by:   Orlan Leavens RMA   Authorized by:   Newt Lukes MD   Signed by:   Orlan Leavens RMA on 01/09/2011   Method used:   Electronically to        [facility] (retail)       [address]       [place], [place]  [id number]       Ph: [phone]       Fax: [phone]   RxID:   [id number] IPRATROPIUM BROMIDE 0.06 % SOLN (IPRATROPIUM BROMIDE) nasal spray for runny nose symptoms -use as directed (as needed)  #1 x 1   Entered and Authorized by:   Newt Lukes MD   Signed by:   Newt Lukes MD on 01/09/2011   Method used:   Electronically to        [facility] (retail)       [address]       [place], [place]  [address]       Ph: [phone]       Fax: [phone]   RxID:   [id number]    Orders Added: 1)  EKG w/ Interpretation [93000] 2)  TLB-BMP (Basic Metabolic Panel-BMET) [80048-METABOL] 3)  TLB-Lipid Panel [80061-LIPID] 4)  Est. Patient 65& > [99397] 5)  Misc. Referral [Misc. Ref] 6)  TLB-TSH (Thyroid Stimulating Hormone) [84443-TSH] 7)  TLB-CBC Platelet - w/Differential [85025-CBCD] 8)  TLB-Hepatic/Liver Function Pnl [80076-HEPATIC] 9)  Est. Patient Level III [31540]

## 2011-02-02 ENCOUNTER — Encounter: Payer: Self-pay | Admitting: Internal Medicine

## 2011-02-02 ENCOUNTER — Ambulatory Visit (INDEPENDENT_AMBULATORY_CARE_PROVIDER_SITE_OTHER): Payer: Medicare Other | Admitting: Internal Medicine

## 2011-02-02 DIAGNOSIS — S81809A Unspecified open wound, unspecified lower leg, initial encounter: Secondary | ICD-10-CM | POA: Insufficient documentation

## 2011-02-02 DIAGNOSIS — S91009A Unspecified open wound, unspecified ankle, initial encounter: Secondary | ICD-10-CM

## 2011-02-02 DIAGNOSIS — I10 Essential (primary) hypertension: Secondary | ICD-10-CM

## 2011-02-02 DIAGNOSIS — S81009A Unspecified open wound, unspecified knee, initial encounter: Secondary | ICD-10-CM | POA: Insufficient documentation

## 2011-02-03 ENCOUNTER — Telehealth: Payer: Self-pay | Admitting: Internal Medicine

## 2011-02-03 ENCOUNTER — Ambulatory Visit: Payer: Medicare Other

## 2011-02-09 NOTE — Progress Notes (Signed)
Summary: bandage on knee  Phone Note Call from Patient Call back at Home Phone [phone]   Reason for Call: Talk to Nurse Summary of Call: dr. Artist Pais saw pt yesterday. pt called asking how long should she wear the bandage on knee? please assist. Initial call taken by: Elba Barman,  February 03, 2011 2:08 PM  Follow-up for Phone Call        call returned to patient at [phone],  patient has been advised to change bandage daily or twice daily if needed. She was advised to keep covered until scab forms and no drainage is present. Patient verbalized understanding and agrees. Follow-up by: Glendell Docker CMA,  February 03, 2011 2:28 PM

## 2011-02-21 NOTE — Assessment & Plan Note (Signed)
Summary: fell on treadmill/cuts knee/ per Vicie Mutters / Dr Felicity Coyer..-rm3   Vital Signs:  Patient profile:   77 year old female Height:      66 inches Weight:      160 pounds BMI:     25.92 O2 Sat:      98 % on Room air Temp:     97.6 degrees F oral Pulse rate:   76 / minute Pulse rhythm:   regular Resp:     16 per minute BP sitting:   150 / 84  (right arm) Cuff size:   regular  Vitals Entered By: Mervin Kung CMA Duncan Dull) (February 02, 2011 11:42 AM)  O2 Flow:  Room air CC: Pt here for evaluation after fall from treadmill this morning. Is Patient Diabetic? No   Primary Care Provider:  Dondra Spry DO  CC:  Pt here for evaluation after fall from treadmill this morning.[name] Kitchen  History of Present Illness: 77 year old white female reports leg injury Patient fell off treadmill this morning resulting in bumps and bruises She also has a skin tear on left  her lower extremity wound is dressed by gym staff mild bleeding  He denies headache, she denies pelvic pain  Htn- bp stable but she is experiencing some lower extremity edema she denies chest pain or shortness of breath  Preventive Screening-Counseling & Management  Alcohol-Tobacco     Alcohol drinks/day: <1     Alcohol Counseling: not indicated; use of alcohol is not excessive or problematic     Smoking Status: never     Tobacco Counseling: not indicated; no tobacco use  Allergies: 1)  ! Codeine  Past History:  Past Medical History: Anxiety Hypertension glaucoma   MD roster: cards - Tresa Endo (remote -2006 cath) optho - [place]    Social History: Never Smoked   married, lives with spouse (2nd marriage in 2009, 1st widowed in 1979)  husband - AMALYA SALMONS enjoys golf, not much aerobic activity-  spends time in [place] part of the year  Review of Systems  The patient denies fever and headaches.    Physical Exam  General:  alert, well-developed, and well-nourished.   Head:  normocephalic and atraumatic.     Mouth:  pharynx pink and moist.   Lungs:  normal respiratory effort and normal breath sounds.   Heart:  normal rate, regular rhythm, and no gallop.   Skin:  u shaped superficial skin tear on left lower extremity Minimal bleeding No surrounding erythema   Impression & Recommendations:  Problem # 1:  WOUND, LEG (ICD-43.43) 77 year old female fell off a treadmill resulting in bumps and bruises and left lower extremity skin tear Cleaned and dressed wound Reviewed signs and symptoms of wound  infection Instructed patient on wound care/dressing changes  Patient advised to call office if symptoms persist or worsen.  Problem # 2:  HYPERTENSION (ICD-401.9) Assessment: Deteriorated BP likely elevated due to recent trauma however amlodipine causing LE edema.  DC norvasc increase lisinopril dose  The following medications were removed from the medication list:    Norvasc 5 Mg Tabs (Amlodipine besylate) .[name] Kitchen... Take 1 by mouth q am Her updated medication list for this problem includes:    Verelan 120 Mg Xr24h-cap (Verapamil hcl) .[name] Kitchen... Take 1 at bedtime    Lisinopril-hydrochlorothiazide 20-12.5 Mg Tabs (Lisinopril-hydrochlorothiazide) ..... One by mouth once daily  BP today: 150/84 Prior BP: 132/70 (01/09/2011)  Labs Reviewed: K+: 4.7 (01/09/2011) Creat: : 0.8 (01/09/2011)   Chol: 204 (01/09/2011)  HDL: 49.10 (01/09/2011)   LDL: 27 (03/29/2009)   TG: 101.0 (01/09/2011)  Complete Medication List: 1)  Verelan 120 Mg Xr24h-cap (Verapamil hcl) .... Take 1 at bedtime 2)  Lisinopril-hydrochlorothiazide 20-12.5 Mg Tabs (Lisinopril-hydrochlorothiazide) .... One by mouth once daily 3)  Lumigan 0.03 % Soln (Bimatoprost) .[name] Kitchen.. 1 drop in each eye q am 4)  Dorzolamide Hcl-timolol Mal 22.3-6.8 Mg/ml Soln (Dorzolamide hcl-timolol mal) .[name] Kitchen.. 1 drop in each eye q 12 hours 5)  Calcium 600 Mg Tabs (Calcium) .[name] Kitchen.. 1 by mouth two times a day 6)  Vitamin D3 1000 Unit Caps (Cholecalciferol) .[name] Kitchen.. 1 by mouth once  daily 7)  Ipratropium Bromide 0.06 % Soln (Ipratropium bromide) .... Nasal spray for runny nose symptoms -use as directed (as needed)  Patient Instructions: 1)  Please schedule a follow-up appointment in 1 month. 2)  BMP prior to visit, ICD-9:  401.9 3)  Stop amlodipine 4)  Change your dressing at least once daily 5)  Call our office if left leg gets swollen, red, and tender Prescriptions: LISINOPRIL-HYDROCHLOROTHIAZIDE 20-12.5 MG TABS (LISINOPRIL-HYDROCHLOROTHIAZIDE) one by mouth once daily  #90 x 1   Entered and Authorized by:   D. Thomos Lemons DO   Signed by:   D. Thomos Lemons DO on 02/02/2011   Method used:   Electronically to        [facility]* (retail)       [address]       [place], [place]  [address]       Ph: [phone]       Fax: [phone]   RxID:   [id number]    Orders Added: 1)  Est. Patient Level III [95638]     Current Allergies (reviewed today): ! CODEINE

## 2011-02-22 ENCOUNTER — Telehealth: Payer: Self-pay | Admitting: *Deleted

## 2011-02-22 NOTE — Telephone Encounter (Signed)
Patient called and left voice message stating she was seen by Dr Artist Pais a couple of weeks ago and was taken off blood pressure medication. She states since being taken off of the medication she is concerned that her blood pressure has been elevated. Yesterday she state her blood pressure reading was 167/ 120. She would like to know what Dr Artist Pais advises

## 2011-02-23 ENCOUNTER — Encounter: Payer: Self-pay | Admitting: Internal Medicine

## 2011-02-23 ENCOUNTER — Ambulatory Visit (INDEPENDENT_AMBULATORY_CARE_PROVIDER_SITE_OTHER): Payer: Medicare Other | Admitting: Internal Medicine

## 2011-02-23 ENCOUNTER — Ambulatory Visit: Payer: Medicare Other | Admitting: Internal Medicine

## 2011-02-23 DIAGNOSIS — I10 Essential (primary) hypertension: Secondary | ICD-10-CM

## 2011-02-23 MED ORDER — AMLODIPINE BESYLATE 2.5 MG PO TABS: 2.5000 mg | ORAL_TABLET | Freq: Every day | ORAL | Status: DC

## 2011-02-23 MED ORDER — LISINOPRIL-HYDROCHLOROTHIAZIDE 20-25 MG PO TABS: 1.0000 | ORAL_TABLET | Freq: Every day | ORAL | Status: DC

## 2011-02-23 NOTE — Telephone Encounter (Signed)
SCHEDULED FOR 3:30 THIS AFTERNOON

## 2011-02-23 NOTE — Patient Instructions (Signed)
Call our office with your blood pressure readings next week.

## 2011-02-23 NOTE — Telephone Encounter (Signed)
Please have pt come in today for BP assessment

## 2011-02-23 NOTE — Telephone Encounter (Signed)
Pt Called back  Please call pt back [phone] ASAP

## 2011-02-27 NOTE — Assessment & Plan Note (Signed)
BP deteriorated since stopping amlodipine (stopped due to complaints of LE edema) Increase lisinopril/hctz dose Restart low amlodipine 2.5 mg

## 2011-02-27 NOTE — Progress Notes (Signed)
  Subjective:    Patient ID: [name], female    DOB: 14-Jul-1923, 78 y.o.   MRN: [id number]  HPI 78 y/o female for follow up Leg wound also completely healed.  Still has small scab  Htn - swelling in her lower ext improved since stopping amlodipine.  bp was fine until 1 week ago.  bp higher than her usual. She denies assoc chest pain or headache    Review of Systems     Objective:   Physical Exam  Constitutional: She appears well-developed and well-nourished.  Neck: Normal range of motion. Neck supple.  Cardiovascular: Normal rate, regular rhythm and normal heart sounds.   Pulmonary/Chest: Effort normal and breath sounds normal. She has no wheezes. She has no rales.          Assessment & Plan:

## 2011-02-28 ENCOUNTER — Ambulatory Visit
Admission: RE | Admit: 2011-02-28 | Discharge: 2011-02-28 | Disposition: A | Discharge status: Home / Self Care | Payer: Medicare Other | Source: Ambulatory Visit | Attending: Internal Medicine | Admitting: Internal Medicine

## 2011-02-28 DIAGNOSIS — Z1231 Encounter for screening mammogram for malignant neoplasm of breast: Secondary | ICD-10-CM

## 2011-03-03 ENCOUNTER — Telehealth: Payer: Self-pay | Admitting: Internal Medicine

## 2011-03-03 NOTE — Telephone Encounter (Signed)
BP readings look good.  She can resume her exercise program

## 2011-03-03 NOTE — Telephone Encounter (Signed)
HER BP READINGS ARE AS FOLLOWS 3-26 139/81  3-27 133/73  3/27 132/71  3/28 133/72  133/73  3/29 132/74 AND TODAY IT IS 134/78   PULSE  3-26  74   3-27  72   3-28  73  3-29 71 3-30 69  WHEN CAN SHE START HER EXERCISE BACK.

## 2011-03-04 NOTE — Telephone Encounter (Signed)
Call placed to patient at [phone], routed via privacy director, patient was informed per Dr Artist Pais instructions

## 2011-03-06 ENCOUNTER — Other Ambulatory Visit: Payer: Medicare Other

## 2011-03-09 ENCOUNTER — Ambulatory Visit: Payer: Medicare Other | Admitting: Internal Medicine

## 2011-03-27 ENCOUNTER — Other Ambulatory Visit (INDEPENDENT_AMBULATORY_CARE_PROVIDER_SITE_OTHER): Payer: Medicare Other

## 2011-03-27 ENCOUNTER — Other Ambulatory Visit: Payer: Self-pay | Admitting: Internal Medicine

## 2011-03-27 DIAGNOSIS — I10 Essential (primary) hypertension: Secondary | ICD-10-CM

## 2011-03-27 LAB — BASIC METABOLIC PANEL
CO2: 29 mEq/L (ref 19–32)
Calcium: 9.9 mg/dL (ref 8.4–10.5)
Chloride: 98 mEq/L (ref 96–112)
Creatinine, Ser: 0.9 mg/dL (ref 0.4–1.2)

## 2011-03-31 ENCOUNTER — Ambulatory Visit (INDEPENDENT_AMBULATORY_CARE_PROVIDER_SITE_OTHER): Payer: Medicare Other | Admitting: Internal Medicine

## 2011-03-31 ENCOUNTER — Encounter: Payer: Self-pay | Admitting: Internal Medicine

## 2011-03-31 VITALS — BP 124/74 | HR 68 | Temp 98.1°F | Resp 18 | Wt 157.0 lb

## 2011-03-31 DIAGNOSIS — I10 Essential (primary) hypertension: Secondary | ICD-10-CM

## 2011-03-31 LAB — HM PAP SMEAR

## 2011-03-31 MED ORDER — LISINOPRIL-HYDROCHLOROTHIAZIDE 20-25 MG PO TABS: 1.0000 | ORAL_TABLET | Freq: Every day | ORAL | Status: DC

## 2011-04-21 NOTE — H&P (Signed)
NAMETOMEKIA, HELTON         ACCOUNT NO.:  [id number]   MEDICAL RECORD NO.:  [id number]          PATIENT TYPE:  EMS   LOCATION:  MAJO                         FACILITY:  [facility]   PHYSICIAN:  Danae Chen, M.D.DATE OF BIRTH:  1923-07-25   DATE OF ADMISSION:  12/08/2004  DATE OF DISCHARGE:                                HISTORY & PHYSICAL   PRIMARY CARE PHYSICIAN:  Dr. Bradd Canary.   CARDIOLOGIST:  Will be [facility].   CHIEF COMPLAINT:  Shortness of breath and chest discomfort.   HISTORY OF PRESENT ILLNESS:  The patient is a pleasant 77 year old female  who over the last 2-3 days reports intermittent chest pain, sharp in nature,  under her left breast area which does not radiate.  It is associated with  shortness of breath.  It is not associated with increased physical activity  and sometimes does occur at rest.  It is not associated with nausea or  vomiting or diaphoresis.  She reports that in addition to this she has been  feeling anxious over the past several days as well.  Last week she saw her  primary care physician for a sinus infection and took a Z-Pak which she  finished over a 3-day period of time.  This made her slightly nauseated and  her appetite has been decreased since that time.  She also reports around an  8-pound weight loss over the last 2-week period.  In November she had root  canal surgery and a crown placed as well.  She has never had chest pain such  as this before.  She has no prior history of a cardiac evaluation.  Her only  cardiac risk factors are hypertension and her age.  She denies any history  of diabetes, high cholesterol, or family history of heart disease.  She is  not a smoker.   PAST MEDICAL HISTORY:  Recently-diagnosed glaucoma for which she has been on  multiple eyedrops and currently is on a beta blocker eyedrops, she thinks.   PAST SURGICAL HISTORY:  The root canal as noted.  She had a breast biopsy  in  the remote past.   FAMILY HISTORY:  Negative for heart disease, diabetes, or hypertension that  she is aware of.   SOCIAL HISTORY:  The patient is widowed, lives alone, has two grown  daughters in the area.  Is active, plays tennis, and is independent for  activities of daily life.   MEDICATIONS:  She is on:  1.  Zestoretic 10 mg/25 mg p.o. daily.  2.  Norvasc 5 mg p.o. daily.   She has no known drug allergies.   REVIEW OF SYSTEMS:  Constipation, decreased appetite, shortness of breath,  chest pain, anxiousness.  No hematochezia, melena.  Is positive also for  dizziness.   PHYSICAL EXAMINATION:  VITAL SIGNS:  Temperature 97.4, blood pressure  110/57, pulse 70, respirations 18, O2 saturation is 98% on room air.  GENERAL:  She is in no acute distress.  HEENT:  Head is atraumatic, normocephalic.  She has no sinus tenderness to  palpation.  Oropharynx is clear, she has no  erythema.  NECK:  Supple.  There is no lymphadenopathy.  LUNGS:  Clear to auscultation bilaterally.  HEART:  Rate is regular, no murmurs are appreciated.  ABDOMEN:  Soft, nontender, no rebound, no guarding, with active bowel  sounds.  EXTREMITIES:  No peripheral edema.  NEUROLOGIC:  Nonfocal with 5/5 grip strength bilaterally.   Chest x-ray shows no acute process consistent with mild COPD.  EKG shows  normal sinus rhythm, some nonspecific ST segment abnormality in leads II and  aVF; no significant changes from EKG done in March 1999.  Initial labs:  Cardiac enzymes initially are negative.  Sodium is low at 130, potassium  4.1, chloride 98, BUN 19, creatinine 1.1.  D-dimer slightly elevated at 0.5.   IMPRESSION:  A 77 year old otherwise active female who plays tennis and who  is experiencing atypical chest pain of several days duration.  This is also  associated with new onset anxiety per the patient's report.  Given the fact  that she has not had any cardiac risk stratification, it is advisable for  her to  be admitted to telemetry and obtain serial cardiac enzymes.  Will  begin low-dose beta blocker, aspirin, ACE inhibitor, and oxygen.  At this  time we will hold off on nitrates as she is not having active chest pain and  this does not present as a typical angina.  Will obtain a cardiology consult  for what is probably a stress Cardiolite in the morning.  We will also check  a TSH and, as noted, follow serial cardiac enzymes.      RLK/MEDQ  D:  12/08/2004  T:  12/08/2004  Job:  [id number]   cc:   Quita Skye. Artis Flock, M.D.  [address]  [place]  [place] [address]  Fax: [phone]

## 2011-04-21 NOTE — Cardiovascular Report (Signed)
[name], [name]         ACCOUNT NO.:  [id number]   MEDICAL RECORD NO.:  [id number]          PATIENT TYPE:  INP   LOCATION:  4709                         FACILITY:  [facility]   PHYSICIAN:  Thereasa Solo. Little, M.D. DATE OF BIRTH:  1922/12/20   DATE OF PROCEDURE:  DATE OF DISCHARGE:                              CARDIAC CATHETERIZATION   INDICATIONS FOR TEST:  This 77 year old female was admitted with somewhat  atypical chest pain. Her EKG is normal and her cardiac enzymes were  unremarkable. She is brought to the catheterization lab for cardiac  catheterization.   After obtaining informed consent, the patient was prepped and draped in the  usual sterile fashion exposing the right groin. Following local anesthetic  with 1% Xylocaine, the Seldinger technique was employed, and a 5-French  introducer sheath was placed into the right femoral artery. Left coronary  arteriography and ventriculography in the RAO projection was performed. The  distal aortogram to rule out renal artery stenosis as the explanation for  hypertension was also performed.   COMPLICATIONS:  None.   EQUIPMENT:  5-French Judkins configuration catheter.   TOTAL CONTRAST USED:  110 cc   RESULTS:  1.  Hemodynamic monitoring: Central aortic pressure was 143/72. Left      ventricular pressure was 140/7, and there was no significant valve      gradient at the time of pullback.  2.  Ventriculography. Ventriculography in the RAO projection using 25 cc of      contrast at 12 cc per second showed normal left ventricular systolic      function with normal size LV cavity. Ejection fraction was in excess of      60%. There was mitral valve prolapse but no mitral regurgitation was      seen. Left ventricular end-diastolic pressure was 11.  3.  Distal aortogram. Distal aortogram done right at the level of the renal      artery showed renal artery stenosis. No evidence of aortoiliac disease.   Coronary arteriography: There  was mild calcification seen in the proximal  portion of the LAD.  1.  Left main trifurcated.  2.  Circumflex. Circumflex stayed in the AV groove and gave rise to one OM      vessel which was free of disease. The ongoing circumflex was free of      disease and small.  3.  Ramus intermediate. This was a large vessel that covered the entire      lateral wall. It was free of disease. There was evidence of myocardial      bridging.  4.  LAD. The LAD crossed the apex of the heart. It gave rise to one large      diagonal branch which was free of disease. The LAD itself just after the      bifurcation of the diagonal was slightly hypolucent and had an area of      around 50% narrowing. There was brisk TIMI III flow. In multiple      projections, I could not demonstrate high-grade stenosis. There was mild      calcification at this point also.  5.  Right coronary artery. The right coronary artery gave rise to a small      PDA and a posterior lateral branch all of which were free of disease.   CONCLUSION:  1.  Normal LV systolic function.  2.  Mitral valve prolapse without mitral regurgitation.  3.  Moderate disease in the LAD just distal to the diagonal.   Because the hypolucency of the area in the LAD, I somewhat concerned, but I  did not feel that warranted any type of intervention including IVUS. Its  location at the bifurcation would make in most amenable to a cutting balloon  if it had to be fixed. Because of its proximal location, stenting would be a  better option, but you clearly would have to cover the entire ostium of a  very large bifurcating first diagonal.   I do not feel that this lesion is critical, but I do plan to do a Cardiolite  on this patient, and if there is ischemia in the anterior wall, would bring  her back for intervention. Of note, the anterior LV wall showed normal  contractility.       ABL/MEDQ  D:  12/09/2004  T:  12/09/2004  Job:  [id number]   cc:   Quita Skye.  Artis Flock, M.D.  [address]  [place]  [place] [address]  Fax: [phone]   Cath lab

## 2011-04-21 NOTE — Discharge Summary (Signed)
NAMEJASON, HAUGE NO.:  [id number]   MEDICAL RECORD NO.:  [id number]          PATIENT TYPE:  INP   LOCATION:  4709                         FACILITY:  [facility]   PHYSICIAN:  Elliot Cousin, M.D.    DATE OF BIRTH:  May 16, 1923   DATE OF ADMISSION:  12/08/2004  DATE OF DISCHARGE:  12/09/2004                                 DISCHARGE SUMMARY   DISCHARGE DIAGNOSES:  1.  Chest pain - myocardial infarction ruled out.  Cardiac catheterization      per Dr. Julieanne Manson revealed a 50% narrowing of the left anterior      descending just after the bifurcation of the diagonal.  The left main      was trifurcated.  The circumflex and the OM vessel were free of disease.      The right coronary artery and the small posterior descending artery were      free of disease.  Normal left ventricular systolic function.  Cardiac      catheterization performed by Dr. Clarene Duke on December 09, 2004.  2.  Hypertension.  3.  Glaucoma.  4.  Generalized anxiety.  5.  Mitral Valve Prolapse, per cardiac catheterization   DISCHARGE DIAGNOSES:  1.  Aspirin 81 mg daily.  2.  Ativan 0.5 mg q.8h. as needed.  3.  Norvasc 5 mg daily.  4.  Lisinopril/HCTZ 20/25 mg daily.  5.  Xalatan eye drops OU q.h.s.  6.  Alphagan eye drops one drop OU b.i.d.   DISCHARGE DISPOSITION:  The patient was discharged to home in improved and  stable condition on December 09, 2004.  She was advised to follow up with Dr.  Artis Flock in 1-2 weeks.  She has been scheduled for a Persantine Cardiolite  study for December 12, 2004 at 8:30 a.m.  She will follow up with Dr. Clarene Duke  on Tuesday, December 13, 2004, at 8:45 a.m.   CONSULTATIONS:  Dr. Julieanne Manson.   PROCEDURES PERFORMED:  Cardiac catheterization on December 09, 2004.  The  results as above.   HISTORY OF PRESENT ILLNESS:  The patient is a 77 year old lady with a past  medical history significant for hypertension who presented to the hospital  with a chief complaint of chest  pain.  The patient had intermittent  substernal chest pain for 2-3 days prior to hospital admission.  The pain  was associated with shortness of breath.  It was not necessarily associated  with increased physical activity.  She reported that she had been feeling  anxious over the past several days, as well.  She recently was treated with  a Z-Pak for a sinus infection by her primary care physician over a 3-day  period of time.  The patient was subsequently admitted for further  evaluation and management.   HOSPITAL COURSE:  CHEST PAIN.  The patient's chest x-ray on admission  revealed no acute disease, but changes consistent with mild chronic  obstructive pulmonary disease.  The EKG showed normal sinus rhythm with some  nonspecific T wave and ST wave abnormalities in leads II and aVF.  There  were no significant changes from the  EKG in March of 1999.  The initial  cardiac markers drawn in the emergency department were negative.  The  patient was hemodynamically stable on admission.  The patient was started on  treatment with Lopressor, Lovenox, and morphine as needed.  Cardiac enzymes  were ordered q.8h. x3.  The cardiologist, Dr. Clarene Duke, was consulted.  The  patient's cardiac enzymes were negative, with the exception of one troponin  I, which was elevated at 0.10.  The follow up cardiac enzymes were negative.  The patient's TSH was within normal limits at 2.986.  The follow up EKG  revealed a normal sinus rhythm with questionable first degree AV block.   Dr. Clarene Duke performed a cardiac catheterization on December 09, 2004.  The  results revealed that the LAD itself, just after the bifurcation of the  diagonal, was slightly hypolucent, and had an area of around 50% narrowing.  There were no other significant findings.  The patient's ejection fraction  was estimated to be in excess of 60%.  There was mitral valve prolapse, but  no mitral regurgitation was seen.   The patient was stable for  hospital discharge several hours following the  cardiac catheterization.  She was maintained on Norvasc and lisinopril/HCTZ  for blood pressure control during the hospital course.  The patient was also  started on Ativan 0.5 mg q.8h. as needed for recent anxiousness.  Per Dr.  Fredirick Maudlin assessment, the patient will be further assessed with a Persantine  Cardiolite study, which will be scheduled for Monday, December 12, 2004, at  8:30 a.m.  The patient was advised to start a baby aspirin at 81 mg daily.   A fasting lipid panel was ordered during the hospital course; however, the  results were pending at the time of hospital discharge.      Deni   DF/MEDQ  D:  12/14/2004  T:  12/14/2004  Job:  [id number]   cc:   Quita Skye. Artis Flock, M.D.  [address], [address]  [place]  [place] [address]  Fax: [phone]   Thereasa Solo. Little, M.D.  [address]  [address]  [place]  [place] [address]  Fax: [phone]

## 2011-05-01 NOTE — Progress Notes (Signed)
Subjective:    Patient ID: [name], female    DOB: 01/04/1923, 77 y.o.   MRN: [id number]  HPI  77 y/o female for follow up re: hypertension.  Pt has been monitoring bp as directed.  Her bp has been better with higher dose of ACE and restarting low dose CCB.  She denies lower ext edema.   Review of Systems    Negative for dizziness, chest pain, or shortness of breath Past Medical History  Diagnosis Date  . Anxiety   . Hypertension   . Glaucoma     History   Social History  . Marital Status: Widowed    Spouse Name: N/A    Number of Children: N/A  . Years of Education: N/A   Occupational History  . Not on file.   Social History Main Topics  . Smoking status: Never Smoker   . Smokeless tobacco: Never Used  . Alcohol Use:   . Drug Use:   . Sexually Active:    Other Topics Concern  . Not on file   Social History Narrative   Lives with spouse (2nd marriage in 2009, 1st widowed in 1979) husband-- Ilayda Toda golf, not much aerobic activity--spends time in [place] part of the year.    Past Surgical History  Procedure Date  . Breast surgery 1950    surgery  . Eye surgery 1960  . Tonsillectomy 1934    Family History  Problem Relation Age of Onset  . Heart disease Mother   . Kidney disease Father   . Heart attack Sister   . Cancer Brother     lung  . Hypertension Other   . Hyperlipidemia Other     Allergies  Allergen Reactions  . Codeine     Current Outpatient Prescriptions on File Prior to Visit  Medication Sig Dispense Refill  . amLODipine (NORVASC) 2.5 MG tablet Take 1 tablet (2.5 mg total) by mouth daily.  30 tablet  3  . bimatoprost (LUMIGAN) 0.03 % ophthalmic drops Place 1 drop into both eyes every morning.        . calcium carbonate (OS-CAL) 600 MG TABS Take 600 mg by mouth 2 (two) times daily with a meal.        . Cholecalciferol (VITAMIN D3) 1000 UNITS CAPS Take 1 capsule by mouth daily.        . dorzolamide-timolol (COSOPT) 22.3-6.8  MG/ML ophthalmic solution Place 1 drop into both eyes every 12 (twelve) hours.        [name] Kitchen ipratropium (ATROVENT) 0.06 % nasal spray Nasal spray for runny nose symptoms. Use as needed.       . verapamil (VERELAN PM) 120 MG 24 hr capsule Take 120 mg by mouth at bedtime.          BP 124/74  Pulse 68  Temp(Src) 98.1 F (36.7 C) (Oral)  Resp 18  Wt 157 lb (71.215 kg)  SpO2 99%    Objective:   Physical Exam  Constitutional: Appears well-developed and well-nourished. No distress.  Head: Normocephalic and atraumatic.  Neck: Normal range of motion. Neck supple. No thyromegaly present. No carotid bruit Cardiovascular: Normal rate, regular rhythm and normal heart sounds.  Exam reveals no gallop and no friction rub.   No murmur heard.  Trace lower ext edema Pulmonary/Chest: Effort normal and breath sounds normal.  No wheezes. No rales.  Skin: Skin is warm and dry.  Psychiatric: Normal mood and affect. Behavior is normal.      Assessment &  Plan:

## 2011-05-01 NOTE — Assessment & Plan Note (Signed)
BP at goal.  Continue current medication regimen.  BP: 124/74 mmHg  Lab Results  Component Value Date   CREATININE 0.9 03/27/2011   Lab Results  Component Value Date   K 4.6 03/27/2011

## 2011-06-20 ENCOUNTER — Encounter: Payer: Self-pay | Admitting: Internal Medicine

## 2011-06-20 ENCOUNTER — Ambulatory Visit (INDEPENDENT_AMBULATORY_CARE_PROVIDER_SITE_OTHER): Payer: Medicare Other | Admitting: Internal Medicine

## 2011-06-20 DIAGNOSIS — I10 Essential (primary) hypertension: Secondary | ICD-10-CM

## 2011-06-20 MED ORDER — LISINOPRIL-HYDROCHLOROTHIAZIDE 20-12.5 MG PO TABS: 1.0000 | ORAL_TABLET | Freq: Every day | ORAL | Status: DC

## 2011-06-25 NOTE — Progress Notes (Signed)
  Subjective:    Patient ID: [name], female    DOB: 1923/06/02, 77 y.o.   MRN: [id number]  HPI Patient presents to clinic for evaluation of hypertension. Patient concerned over him in my lobe blood pressures. No presyncope dizziness or syncope. Home blood pressure log reviewed primarily normotensive however there are multiple readings systolic of 90's. Comply with medication without adverse effect. Blood pressure today reviewed as normotensive. No other complaints  Reviewed past medical history, medications and allergies    Review of Systems see history of present illness     Objective:   Physical Exam    Physical Exam  Vitals reviewed. Constitutional:  appears well-developed and well-nourished. No distress.  HENT:  Head: Normocephalic and atraumatic.   Nose: Nose normal.  Eyes: Conjunctivae clear.Right eye exhibits no discharge. Left eye exhibits no discharge. No scleral icterus.  Neck: Neck supple. No JVD. Cardiovascular: Normal rate, regular rhythm and normal heart sounds.  Exam reveals no gallop and no friction rub.   No murmur heard. Pulmonary/Chest: Effort normal and breath sounds normal. No respiratory distress.  has no wheezes.  has no rales.  Lymphadenopathy:   no cervical adenopathy.  Neurological:  is alert.  Skin: Skin is warm and dry.  not diaphoretic.  Psychiatric: normal mood and affect.      Assessment & Plan:

## 2011-06-25 NOTE — Assessment & Plan Note (Signed)
Intermittent mildly low blood pressures. Decreased dose of Prinzide. Monitor blood pressure as an outpatient and followup in clinic as scheduled

## 2011-07-06 ENCOUNTER — Encounter: Payer: Self-pay | Admitting: Internal Medicine

## 2011-07-14 ENCOUNTER — Other Ambulatory Visit: Payer: Self-pay | Admitting: *Deleted

## 2011-07-14 MED ORDER — VERAPAMIL HCL ER 120 MG PO CP24: 120.0000 mg | ORAL_CAPSULE | Freq: Every day | ORAL | Status: DC

## 2011-08-02 ENCOUNTER — Encounter: Payer: Self-pay | Admitting: Internal Medicine

## 2011-08-02 ENCOUNTER — Ambulatory Visit (INDEPENDENT_AMBULATORY_CARE_PROVIDER_SITE_OTHER): Payer: Medicare Other | Admitting: Internal Medicine

## 2011-08-02 DIAGNOSIS — M899 Disorder of bone, unspecified: Secondary | ICD-10-CM

## 2011-08-02 DIAGNOSIS — R5383 Other fatigue: Secondary | ICD-10-CM

## 2011-08-02 DIAGNOSIS — M8580 Other specified disorders of bone density and structure, unspecified site: Secondary | ICD-10-CM

## 2011-08-02 DIAGNOSIS — M949 Disorder of cartilage, unspecified: Secondary | ICD-10-CM

## 2011-08-02 DIAGNOSIS — I10 Essential (primary) hypertension: Secondary | ICD-10-CM

## 2011-08-02 DIAGNOSIS — R5381 Other malaise: Secondary | ICD-10-CM

## 2011-08-02 MED ORDER — LISINOPRIL-HYDROCHLOROTHIAZIDE 20-25 MG PO TABS: 1.0000 | ORAL_TABLET | Freq: Every day | ORAL | Status: DC

## 2011-08-02 NOTE — Patient Instructions (Signed)
Take vitamin D3 2000 units once daily

## 2011-08-02 NOTE — Progress Notes (Signed)
Subjective:    Patient ID: [name], female    DOB: 26-Jul-1923, 77 y.o.   MRN: [id number]  HPI  77 y/o white female for follow up.   At prev visit, her hctz portion of bp combo med was decreased due to low SBP readings at home (94-96 systolic).  She was not symptomatic with these low readings.  Since lowering her BP meds, her home bp readings have been steadily rising to 140-150's systolic.  No assoc headache or chest pain.   Review of Systems Negative for new supplements, or change in exercise program.  She tires more easily.  She denies palpitations.  Good sleep quality  Past Medical History  Diagnosis Date  . Anxiety   . Hypertension   . Glaucoma   . ANXIETY 12/31/2009  . CHEST PAIN 12/08/2004  . FATIGUE 01/09/2011  . GLUCOMA 12/31/2009  . HYPERTENSION 12/31/2009  . LEFT BUNDLE BRANCH BLOCK 01/03/2010  . MALIGNANT MELANOMA, HX OF 01/03/2010  . MITRAL VALVE PROLAPSE 12/31/2009  . Glaucoma     History   Social History  . Marital Status: Widowed    Spouse Name: N/A    Number of Children: N/A  . Years of Education: N/A   Occupational History  . Not on file.   Social History Main Topics  . Smoking status: Never Smoker   . Smokeless tobacco: Never Used   Comment: Married,, lives wirh spouse (2nd marriage in 2009, 1st widowed in 1979), husband keilee denman enjoys golf, not much aerobic activity. spends time in [place] part of the year  . Alcohol Use:   . Drug Use:   . Sexually Active:    Other Topics Concern  . Not on file   Social History Narrative   Lives with spouse (2nd marriage in 2009, 1st widowed in 1979) husband-- Denajah Farias golf, not much aerobic activity--spends time in [place] part of the year.    Past Surgical History  Procedure Date  . Eye surgery 1960  . Tonsillectomy 1934  . Breast surgery 1950    surgery    Family History  Problem Relation Age of Onset  . Heart disease Mother   . Kidney disease Father   . Heart attack Sister   . Cancer  Brother     lung  . Hypertension Other   . Hyperlipidemia Other     Allergies  Allergen Reactions  . Codeine     Current Outpatient Prescriptions on File Prior to Visit  Medication Sig Dispense Refill  . calcium carbonate (OS-CAL) 600 MG TABS Take 600 mg by mouth 2 (two) times daily with a meal.        . dorzolamide-timolol (COSOPT) 22.3-6.8 MG/ML ophthalmic solution Place 1 drop into both eyes every 12 (twelve) hours.        [name] Kitchen ipratropium (ATROVENT) 0.06 % nasal spray Nasal spray for runny nose symptoms. Use as needed.       . verapamil (VERELAN PM) 120 MG 24 hr capsule Take 1 capsule (120 mg total) by mouth at bedtime.  90 capsule  1  . Cholecalciferol (VITAMIN D3) 1000 UNITS CAPS Take 1 capsule by mouth daily.          BP 142/76  Pulse 72  Temp(Src) 97.6 F (36.4 C) (Oral)  Wt 157 lb (71.215 kg)       Objective:   Physical Exam   Constitutional: Appears well-developed and well-nourished. No distress.  Head: Normocephalic and atraumatic.  Eyes: Conjunctivae are normal. Pupils  are equal, round, and reactive to light.  Neck: Normal range of motion. Neck supple. No thyromegaly present. No carotid bruit Cardiovascular: Normal rate, regular rhythm and normal heart sounds.  Exam reveals no gallop and no friction rub.   No murmur heard. Pulmonary/Chest: Effort normal and breath sounds normal.  No wheezes. No rales.  Abdominal: Soft. Bowel sounds are normal. No mass. There is no tenderness.  Neurological: Alert. No cranial nerve deficit.  Skin: Skin is warm and dry.  Psychiatric: Normal mood and affect. Behavior is normal.      Assessment & Plan:

## 2011-08-03 ENCOUNTER — Other Ambulatory Visit: Payer: Self-pay | Admitting: *Deleted

## 2011-08-03 LAB — LIPID PANEL
HDL: 45.6 mg/dL (ref >39.00)
LDL Cholesterol: 119 mg/dL — ABNORMAL HIGH (ref 0–99)
Total CHOL/HDL Ratio: 4
Triglycerides: 144 mg/dL (ref 0.0–149.0)
VLDL: 28.8 mg/dL (ref 0.0–40.0)

## 2011-08-03 LAB — BASIC METABOLIC PANEL
BUN: 16 mg/dL (ref 6–23)
Chloride: 98 mEq/L (ref 96–112)
Creatinine, Ser: 0.9 mg/dL (ref 0.4–1.2)
Glucose, Bld: 102 mg/dL — ABNORMAL HIGH (ref 70–99)
Potassium: 4 mEq/L (ref 3.5–5.1)

## 2011-08-03 LAB — CBC
HCT: 39.9 % (ref 36.0–46.0)
MCH: 35.5 pg — ABNORMAL HIGH (ref 26.0–34.0)
MCHC: 35.3 g/dL (ref 30.0–36.0)
MCV: 100.5 fL — ABNORMAL HIGH (ref 78.0–100.0)
Platelets: 249 10*3/uL (ref 150–400)
RDW: 14 % (ref 11.5–15.5)

## 2011-08-03 NOTE — Assessment & Plan Note (Addendum)
Pt restarted her previous dose of lisinopril / hctz 20/25.  Her BP with manual cuff 132/80.  Continue home BP readings.  She did not notice change in fatigue level when sbp 100's or 140's. Goal SBP 120-130's. Pt advised to check BP in early afternoon.  Home BP monitoring protocol reviewed.  Continue regular mild exercises.  Lab Results  Component Value Date   CREATININE 0.9 03/27/2011

## 2011-08-03 NOTE — Assessment & Plan Note (Signed)
No obvious cause on exam. Check labs

## 2011-08-10 ENCOUNTER — Other Ambulatory Visit (INDEPENDENT_AMBULATORY_CARE_PROVIDER_SITE_OTHER): Payer: Medicare Other

## 2011-08-10 DIAGNOSIS — D510 Vitamin B12 deficiency anemia due to intrinsic factor deficiency: Secondary | ICD-10-CM

## 2011-08-14 LAB — PTH, INTACT AND CALCIUM: Calcium, Total (PTH): 10.7 mg/dL — ABNORMAL HIGH (ref 8.4–10.5)

## 2011-08-15 ENCOUNTER — Encounter: Payer: Self-pay | Admitting: Internal Medicine

## 2011-08-15 ENCOUNTER — Ambulatory Visit (INDEPENDENT_AMBULATORY_CARE_PROVIDER_SITE_OTHER): Payer: Medicare Other | Admitting: Internal Medicine

## 2011-08-15 DIAGNOSIS — I10 Essential (primary) hypertension: Secondary | ICD-10-CM

## 2011-08-15 DIAGNOSIS — E213 Hyperparathyroidism, unspecified: Secondary | ICD-10-CM | POA: Insufficient documentation

## 2011-08-15 DIAGNOSIS — E538 Deficiency of other specified B group vitamins: Secondary | ICD-10-CM

## 2011-08-15 MED ORDER — SCOPOLAMINE 1 MG/3DAYS TD PT72: 1.0000 | MEDICATED_PATCH | TRANSDERMAL | Status: DC

## 2011-08-15 MED ORDER — ONDANSETRON HCL 4 MG PO TABS: 4.0000 mg | ORAL_TABLET | Freq: Three times a day (TID) | ORAL | Status: AC | PRN

## 2011-08-15 NOTE — Assessment & Plan Note (Signed)
Well controlled.  Continue current medication regimen.  Lab Results  Component Value Date   CREATININE 0.9 08/02/2011   Lab Results  Component Value Date   K 4.0 08/02/2011

## 2011-08-15 NOTE — Assessment & Plan Note (Signed)
Patient with low normal B12 level. Patient advised to start oral B12 replacement. Repeat B12 in 3 months.

## 2011-08-15 NOTE — Patient Instructions (Addendum)
Please complete the following lab tests in 3 months: Intact PTH, Calcium - 275.42 B12 level - 266.2

## 2011-08-15 NOTE — Assessment & Plan Note (Signed)
77 year old with mild hypercalcemia. She is asymptomatic. This may be mild primary hyperparathyroidism versus elevated calcium level secondary to thiazide diuretics.  Repeat intact PTH and calcium level in 3 months. Patient advised to discontinue calcium supplements.

## 2011-08-15 NOTE — Progress Notes (Signed)
  Subjective:    Patient ID: [name], female    DOB: 12/01/1923, 77 y.o.   MRN: [id number]  HPI  77 year old white female for followup regarding hypertension and mildly elevated calcium level. Intact PTH was obtained with the following results parathyroid hormone level slightly elevated at 84.2. Calcium level 10.7.   She is asymptomatic.   Her hydrochlorothiazide dose was recently increased over the last 6 months.  Patient has been monitoring her blood pressure at home with systolic blood pressure readings between 110 and 120. She denies dizziness.  Patient and husband are planning a [place] cruise in the next several months. And requests prescription for antinausea medication and scopolamine patch.  She has never experienced a sickness in the past  Review of Systems as above   Objective:   Physical Exam   Constitutional: Appears well-developed and well-nourished. No distress.  Neck: Normal range of motion. Neck supple. No thyromegaly present. No carotid bruit Cardiovascular: Normal rate, regular rhythm and normal heart sounds.  Exam reveals no gallop and no friction rub.   No murmur heard. Pulmonary/Chest: Effort normal and breath sounds normal.  No wheezes. No rales.  Neurological: Alert. No cranial nerve deficit.  Skin: Skin is warm and dry.  Psychiatric: Normal mood and affect. Behavior is normal.      Assessment & Plan:

## 2011-08-24 ENCOUNTER — Encounter: Payer: Self-pay | Admitting: Internal Medicine

## 2011-09-05 ENCOUNTER — Encounter: Payer: Self-pay | Admitting: Internal Medicine

## 2011-09-19 ENCOUNTER — Ambulatory Visit: Payer: Medicare Other | Admitting: Internal Medicine

## 2011-09-25 ENCOUNTER — Ambulatory Visit: Payer: Medicare Other | Admitting: Internal Medicine

## 2011-11-14 ENCOUNTER — Other Ambulatory Visit (INDEPENDENT_AMBULATORY_CARE_PROVIDER_SITE_OTHER): Payer: Medicare Other

## 2011-11-14 DIAGNOSIS — E538 Deficiency of other specified B group vitamins: Secondary | ICD-10-CM

## 2011-11-14 DIAGNOSIS — Z79899 Other long term (current) drug therapy: Secondary | ICD-10-CM

## 2011-11-15 LAB — PTH, INTACT AND CALCIUM
Calcium, Total (PTH): 10.6 mg/dL — ABNORMAL HIGH (ref 8.4–10.5)
PTH: 80.4 pg/mL — ABNORMAL HIGH (ref 14.0–72.0)

## 2011-11-20 ENCOUNTER — Other Ambulatory Visit: Payer: Self-pay | Admitting: Internal Medicine

## 2011-11-20 ENCOUNTER — Telehealth: Payer: Self-pay | Admitting: Internal Medicine

## 2011-11-20 NOTE — Telephone Encounter (Signed)
Pt requesting results of lab work. Please contact. °

## 2011-11-20 NOTE — Progress Notes (Signed)
Quick Note:  Pt aware of results. ______ 

## 2011-11-20 NOTE — Progress Notes (Signed)
Addended by: Simeon Craft on: 11/20/2011 10:20 AM   Modules accepted: Orders

## 2011-11-20 NOTE — Telephone Encounter (Signed)
See labs 

## 2011-11-29 ENCOUNTER — Ambulatory Visit (INDEPENDENT_AMBULATORY_CARE_PROVIDER_SITE_OTHER): Payer: Medicare Other | Admitting: Internal Medicine

## 2011-11-29 ENCOUNTER — Encounter: Payer: Self-pay | Admitting: Internal Medicine

## 2011-11-29 DIAGNOSIS — R21 Rash and other nonspecific skin eruption: Secondary | ICD-10-CM | POA: Insufficient documentation

## 2011-11-29 MED ORDER — TRIAMCINOLONE ACETONIDE 0.1 % EX CREA: TOPICAL_CREAM | Freq: Two times a day (BID) | CUTANEOUS | Status: DC

## 2011-11-29 NOTE — Patient Instructions (Signed)
Please call our office if your rash does not improve or gets worse.  

## 2011-11-29 NOTE — Assessment & Plan Note (Signed)
Repeat calcium level and parathyroid hormone levels are still elevated.  Question mild primary hyperparathyroidism.  Refer to endocrinologist for further evaluation.

## 2011-11-29 NOTE — Progress Notes (Signed)
Subjective:    Patient ID: [name], female    DOB: 1923-07-15, 77 y.o.   MRN: [id number]  Rash The current episode started in the past 7 days. The problem has been gradually worsening since onset. The affected locations include the neck. The rash is characterized by itchiness. She was exposed to nothing. Pertinent negatives include no fever. Past treatments include antibiotic cream. The treatment provided no relief.  She denies new exposure.  No new jewelry.   Hypercalcemia - pt has hx of mild hypercalcemia.  Since previous visit, patient stopped taking vitamin D 3 as she thought this was contributing to hypercalcemia.   Review of Systems  Constitutional: Negative for fever.  Skin: Positive for rash.   Past Medical History  Diagnosis Date  . Anxiety   . Hypertension   . Glaucoma   . ANXIETY 12/31/2009  . CHEST PAIN 12/08/2004  . FATIGUE 01/09/2011  . GLUCOMA 12/31/2009  . HYPERTENSION 12/31/2009  . LEFT BUNDLE BRANCH BLOCK 01/03/2010  . MALIGNANT MELANOMA, HX OF 01/03/2010  . MITRAL VALVE PROLAPSE 12/31/2009  . Glaucoma     History   Social History  . Marital Status: Widowed    Spouse Name: N/A    Number of Children: N/A  . Years of Education: N/A   Occupational History  . Not on file.   Social History Main Topics  . Smoking status: Never Smoker   . Smokeless tobacco: Never Used   Comment: Married,, lives wirh spouse (2nd marriage in 2009, 1st widowed in 1979), husband tinzlee craker enjoys golf, not much aerobic activity. spends time in [place] part of the year  . Alcohol Use:   . Drug Use:   . Sexually Active:    Other Topics Concern  . Not on file   Social History Narrative   Lives with spouse (2nd marriage in 2009, 1st widowed in 1979) husband-- Nafeesah Lapaglia golf, not much aerobic activity--spends time in [place] part of the year.    Past Surgical History  Procedure Date  . Eye surgery 1960  . Tonsillectomy 1934  . Breast surgery 1950    surgery     Family History  Problem Relation Age of Onset  . Heart disease Mother   . Kidney disease Father   . Heart attack Sister   . Cancer Brother     lung  . Hypertension Other   . Hyperlipidemia Other     Allergies  Allergen Reactions  . Codeine     Current Outpatient Prescriptions on File Prior to Visit  Medication Sig Dispense Refill  . Cholecalciferol (VITAMIN D3) 1000 UNITS CAPS Take 1 capsule by mouth daily.        . dorzolamide-timolol (COSOPT) 22.3-6.8 MG/ML ophthalmic solution Place 1 drop into both eyes every 12 (twelve) hours.        [name] Kitchen ipratropium (ATROVENT) 0.06 % nasal spray Nasal spray for runny nose symptoms. Use as needed.       [name] Kitchen lisinopril-hydrochlorothiazide (PRINZIDE,ZESTORETIC) 20-25 MG per tablet Take 1 tablet by mouth daily.  90 tablet  1  . travoprost, benzalkonium, (TRAVATAN) 0.004 % ophthalmic solution Place 1 drop into both eyes at bedtime.        . verapamil (VERELAN PM) 120 MG 24 hr capsule Take 1 capsule (120 mg total) by mouth at bedtime.  90 capsule  1    BP 128/72  Pulse 76  Temp(Src) 97.7 F (36.5 C) (Oral)  Wt 154 lb (69.854 kg)  Objective:   Physical Exam  Constitutional: She appears well-developed and well-nourished.  HENT:  Head: Normocephalic and atraumatic.  Cardiovascular: Normal rate, regular rhythm and normal heart sounds.   Pulmonary/Chest: Effort normal and breath sounds normal. No respiratory distress. She has no wheezes.  Skin:       Irregular shaped area of redness of left lower neck.  No  blistering        Assessment & Plan:

## 2011-11-29 NOTE — Assessment & Plan Note (Signed)
Patient with small area of redness on left lower neck.  Her symptoms started with pruritus. I suspect allergic reaction. Trial of triamcinolone cream twice a day as directed.  Patient advised to call office if symptoms persist or worsen.

## 2011-12-11 ENCOUNTER — Other Ambulatory Visit: Payer: Self-pay | Admitting: *Deleted

## 2011-12-11 MED ORDER — IPRATROPIUM BROMIDE 0.06 % NA SOLN: 2.0000 | Freq: Every day | NASAL | Status: DC

## 2012-01-26 ENCOUNTER — Other Ambulatory Visit: Payer: Self-pay | Admitting: Internal Medicine

## 2012-01-26 NOTE — Telephone Encounter (Signed)
Rx sent to pharmacy   

## 2012-02-15 ENCOUNTER — Telehealth: Payer: Self-pay | Admitting: Internal Medicine

## 2012-02-15 NOTE — Telephone Encounter (Signed)
Pt is experiencing Deep Cough, aches,overall does not feel well and is would like to be worked in to see Dr. Artist Pais Friday.. Please advise

## 2012-02-16 ENCOUNTER — Encounter: Payer: Self-pay | Admitting: Internal Medicine

## 2012-02-16 ENCOUNTER — Ambulatory Visit (INDEPENDENT_AMBULATORY_CARE_PROVIDER_SITE_OTHER): Payer: Medicare Other | Admitting: Internal Medicine

## 2012-02-16 VITALS — BP 154/86 | Temp 98.8°F | Wt 151.0 lb

## 2012-02-16 DIAGNOSIS — J069 Acute upper respiratory infection, unspecified: Secondary | ICD-10-CM | POA: Insufficient documentation

## 2012-02-16 MED ORDER — LISINOPRIL-HYDROCHLOROTHIAZIDE 20-25 MG PO TABS: 1.0000 | ORAL_TABLET | Freq: Every day | ORAL | Status: DC

## 2012-02-16 MED ORDER — VERAPAMIL HCL ER 120 MG PO CP24: 120.0000 mg | ORAL_CAPSULE | Freq: Every day | ORAL | Status: DC

## 2012-02-16 MED ORDER — HYDROCOD POLST-CHLORPHEN POLST 10-8 MG/5ML PO LQCR: 5.0000 mL | Freq: Every evening | ORAL | Status: DC | PRN

## 2012-02-16 NOTE — Patient Instructions (Signed)
Gargle with warm salt water and use nasal saline as directed. Use mucinex during the day for cough Please call our office if your symptoms do not improve or gets worse.

## 2012-02-16 NOTE — Assessment & Plan Note (Signed)
78 year old white female with signs and symptoms of viral URI. We discussed symptomatic treatment. Used Tussionex for cough. Patient advised to call office if symptoms persist or worsen.

## 2012-02-16 NOTE — Progress Notes (Signed)
Subjective:    Patient ID: [name], female    DOB: 09/12/1923, 78 y.o.   MRN: [id number]  URI  This is a new problem. The current episode started in the past 7 days. The problem has been gradually worsening. There has been no fever. Associated symptoms include congestion, coughing and a sore throat. Pertinent negatives include no ear pain or wheezing. She has tried nothing for the symptoms.      Review of Systems  HENT: Positive for congestion and sore throat. Negative for ear pain.   Respiratory: Positive for cough. Negative for wheezing.   positive for fatigue  Past Medical History  Diagnosis Date  . Anxiety   . Hypertension   . Glaucoma   . ANXIETY 12/31/2009  . CHEST PAIN 12/08/2004  . FATIGUE 01/09/2011  . GLUCOMA 12/31/2009  . HYPERTENSION 12/31/2009  . LEFT BUNDLE BRANCH BLOCK 01/03/2010  . MALIGNANT MELANOMA, HX OF 01/03/2010  . MITRAL VALVE PROLAPSE 12/31/2009  . Glaucoma     History   Social History  . Marital Status: Widowed    Spouse Name: N/A    Number of Children: N/A  . Years of Education: N/A   Occupational History  . Not on file.   Social History Main Topics  . Smoking status: Never Smoker   . Smokeless tobacco: Never Used   Comment: Married,, lives wirh spouse (2nd marriage in 2009, 1st widowed in 1979), husband niyana chesbro enjoys golf, not much aerobic activity. spends time in [place] part of the year  . Alcohol Use:   . Drug Use:   . Sexually Active:    Other Topics Concern  . Not on file   Social History Narrative   Lives with spouse (2nd marriage in 2009, 1st widowed in 1979) husband-- Shaunda Tipping golf, not much aerobic activity--spends time in [place] part of the year.    Past Surgical History  Procedure Date  . Eye surgery 1960  . Tonsillectomy 1934  . Breast surgery 1950    surgery    Family History  Problem Relation Age of Onset  . Heart disease Mother   . Kidney disease Father   . Heart attack Sister   . Cancer  Brother     lung  . Hypertension Other   . Hyperlipidemia Other     Allergies  Allergen Reactions  . Codeine     Current Outpatient Prescriptions on File Prior to Visit  Medication Sig Dispense Refill  . Cholecalciferol (VITAMIN D) 2000 UNITS CAPS Take 1 capsule by mouth daily.        . dorzolamide-timolol (COSOPT) 22.3-6.8 MG/ML ophthalmic solution Place 1 drop into both eyes every 12 (twelve) hours.        [name] Kitchen ipratropium (ATROVENT) 0.06 % nasal spray Place 2 sprays into the nose daily. Nasal spray for runny nose symptoms. Use as needed.  15 mL  5  . travoprost, benzalkonium, (TRAVATAN) 0.004 % ophthalmic solution Place 1 drop into both eyes at bedtime.        . triamcinolone cream (KENALOG) 0.1 % Apply topically 2 (two) times daily.  30 g  0    BP 154/86  Temp(Src) 98.8 F (37.1 C) (Oral)  Wt 151 lb (68.493 kg)       Objective:   Physical Exam  Constitutional: She appears well-developed and well-nourished.  HENT:  Right Ear: External ear normal.  Left Ear: External ear normal.  Mouth/Throat: No oropharyngeal exudate.  Neck: Neck supple.  Cardiovascular: Normal  rate, regular rhythm and normal heart sounds.   Pulmonary/Chest: Effort normal and breath sounds normal. She has no wheezes. She has no rales.  Lymphadenopathy:    She has no cervical adenopathy.  Skin: Skin is warm and dry.       Assessment & Plan:

## 2012-02-16 NOTE — Telephone Encounter (Signed)
Can come in at 4:15 

## 2012-02-21 ENCOUNTER — Other Ambulatory Visit: Payer: Self-pay | Admitting: Internal Medicine

## 2012-02-21 ENCOUNTER — Ambulatory Visit: Payer: Medicare Other | Admitting: Internal Medicine

## 2012-02-21 DIAGNOSIS — M8580 Other specified disorders of bone density and structure, unspecified site: Secondary | ICD-10-CM

## 2012-02-21 DIAGNOSIS — E210 Primary hyperparathyroidism: Secondary | ICD-10-CM

## 2012-02-21 MED ORDER — ZOLEDRONIC ACID 5 MG/100ML IV SOLN: 5.0000 mg | Freq: Once | INTRAVENOUS | Status: DC

## 2012-02-23 ENCOUNTER — Other Ambulatory Visit: Payer: Self-pay | Admitting: Internal Medicine

## 2012-02-23 MED ORDER — IBANDRONATE SODIUM 150 MG PO TABS: 150.0000 mg | ORAL_TABLET | ORAL | Status: DC

## 2012-02-23 NOTE — Progress Notes (Signed)
Patient was advised by her endocrinologist to consider taking a bisphosphonate. She has some hesitation with IV ReClast. I suggested patient try Boniva 150 mg monthly.

## 2012-03-18 ENCOUNTER — Other Ambulatory Visit (HOSPITAL_COMMUNITY): Payer: Self-pay | Admitting: *Deleted

## 2012-03-19 ENCOUNTER — Encounter (HOSPITAL_COMMUNITY): Admission: RE | Admit: 2012-03-19 | Payer: Medicare Other | Source: Ambulatory Visit

## 2012-04-04 ENCOUNTER — Other Ambulatory Visit: Payer: Self-pay | Admitting: *Deleted

## 2012-04-04 MED ORDER — VERAPAMIL HCL ER 120 MG PO CP24: 120.0000 mg | ORAL_CAPSULE | Freq: Every day | ORAL | Status: DC

## 2012-05-23 ENCOUNTER — Other Ambulatory Visit: Payer: Self-pay | Admitting: *Deleted

## 2012-05-23 DIAGNOSIS — E785 Hyperlipidemia, unspecified: Secondary | ICD-10-CM

## 2012-05-23 DIAGNOSIS — IMO0002 Reserved for concepts with insufficient information to code with codable children: Secondary | ICD-10-CM

## 2012-05-23 DIAGNOSIS — I10 Essential (primary) hypertension: Secondary | ICD-10-CM

## 2012-06-05 ENCOUNTER — Other Ambulatory Visit (INDEPENDENT_AMBULATORY_CARE_PROVIDER_SITE_OTHER): Payer: Medicare Other

## 2012-06-05 DIAGNOSIS — IMO0002 Reserved for concepts with insufficient information to code with codable children: Secondary | ICD-10-CM

## 2012-06-05 DIAGNOSIS — I10 Essential (primary) hypertension: Secondary | ICD-10-CM

## 2012-06-05 DIAGNOSIS — E785 Hyperlipidemia, unspecified: Secondary | ICD-10-CM

## 2012-06-05 LAB — LIPID PANEL
LDL Cholesterol: 106 mg/dL — ABNORMAL HIGH (ref 0–99)
Total CHOL/HDL Ratio: 4
Triglycerides: 109 mg/dL (ref 0.0–149.0)

## 2012-06-05 LAB — BASIC METABOLIC PANEL
BUN: 14 mg/dL (ref 6–23)
Creatinine, Ser: 0.9 mg/dL (ref 0.4–1.2)
GFR: 67.28 mL/min (ref >60.00)
Glucose, Bld: 100 mg/dL — ABNORMAL HIGH (ref 70–99)
Potassium: 3.9 mEq/L (ref 3.5–5.1)

## 2012-06-05 LAB — HIGH SENSITIVITY CRP: CRP, High Sensitivity: 3.54 mg/dL (ref 0.000–5.000)

## 2012-06-07 LAB — PTH, INTACT AND CALCIUM

## 2012-06-14 ENCOUNTER — Encounter: Payer: Self-pay | Admitting: Internal Medicine

## 2012-06-14 ENCOUNTER — Ambulatory Visit (INDEPENDENT_AMBULATORY_CARE_PROVIDER_SITE_OTHER): Payer: Medicare Other | Admitting: Internal Medicine

## 2012-06-14 VITALS — BP 130/70 | HR 76 | Temp 98.3°F | Wt 149.0 lb

## 2012-06-14 DIAGNOSIS — M949 Disorder of cartilage, unspecified: Secondary | ICD-10-CM

## 2012-06-14 DIAGNOSIS — M8580 Other specified disorders of bone density and structure, unspecified site: Secondary | ICD-10-CM

## 2012-06-14 DIAGNOSIS — I10 Essential (primary) hypertension: Secondary | ICD-10-CM

## 2012-06-14 NOTE — Patient Instructions (Signed)
Ask your pharmacist about changing your generic verapamil. Please complete the following lab tests before your next follow up appointment: BMET - 401.9

## 2012-06-14 NOTE — Assessment & Plan Note (Addendum)
Patient tolerating Boniva w/o difficulty.  Consider repeat bone density in 1 year.  Last DEXA 2010.  Continue vitamin D supplement.

## 2012-06-14 NOTE — Assessment & Plan Note (Signed)
Stable.  Patient's generic verapamil change by her pharmacy. Patient advised to try to go back to her previous medication. Otherwise no change in medication.  She has mild hyponatremia which we will follow every 3-4 months.  BP: 130/70 mmHg

## 2012-06-14 NOTE — Assessment & Plan Note (Signed)
Followed by Dr Kerr 

## 2012-06-14 NOTE — Progress Notes (Signed)
Subjective:    Patient ID: [name], female    DOB: 29-Dec-1922, 78 y.o.   MRN: [id number]  HPI  78 year old white female with history of hypertension, hyperparathyroidism and osteopenia for followup. Overall patient has been doing very well. She does not have any active complaints. She was prescribed monthly Boniva tablets. She has tolerated this well without any difficulty. She denies heartburn or upset stomach.  Hypertension-she has intermittent cough. Unclear whether this is due to allergies versus her ACE inhibitor. She has been on ACE inhibitor for years.  She reports a single episode of low blood pressure. She did not have any associated symptoms. Symptoms occurred around the time when she changed pharmacies. Patient reports she is getting a new version of verapamil from another generic manufacturer.  Review of Systems Negative for chest pain or shortness of breath  Past Medical History  Diagnosis Date  . Anxiety   . Hypertension   . Glaucoma   . ANXIETY 12/31/2009  . CHEST PAIN 12/08/2004  . FATIGUE 01/09/2011  . GLUCOMA 12/31/2009  . HYPERTENSION 12/31/2009  . LEFT BUNDLE BRANCH BLOCK 01/03/2010  . MALIGNANT MELANOMA, HX OF 01/03/2010  . MITRAL VALVE PROLAPSE 12/31/2009  . Glaucoma     History   Social History  . Marital Status: Widowed    Spouse Name: N/A    Number of Children: N/A  . Years of Education: N/A   Occupational History  . Not on file.   Social History Main Topics  . Smoking status: Never Smoker   . Smokeless tobacco: Never Used   Comment: Married,, lives wirh spouse (2nd marriage in 2009, 1st widowed in 1979), husband damya comley enjoys golf, not much aerobic activity. spends time in [place] part of the year  . Alcohol Use:   . Drug Use:   . Sexually Active:    Other Topics Concern  . Not on file   Social History Narrative   Lives with spouse (2nd marriage in 2009, 1st widowed in 1979) husband-- Marcee Jacobs golf, not much aerobic  activity--spends time in [place] part of the year.    Past Surgical History  Procedure Date  . Eye surgery 1960  . Tonsillectomy 1934  . Breast surgery 1950    surgery    Family History  Problem Relation Age of Onset  . Heart disease Mother   . Kidney disease Father   . Heart attack Sister   . Cancer Brother     lung  . Hypertension Other   . Hyperlipidemia Other     Allergies  Allergen Reactions  . Codeine     Current Outpatient Prescriptions on File Prior to Visit  Medication Sig Dispense Refill  . chlorpheniramine-HYDROcodone (TUSSIONEX) 10-8 MG/5ML LQCR Take 5 mLs by mouth at bedtime as needed.  120 mL  0  . Cholecalciferol (VITAMIN D) 2000 UNITS CAPS Take 1 capsule by mouth daily.        . dorzolamide-timolol (COSOPT) 22.3-6.8 MG/ML ophthalmic solution Place 1 drop into both eyes every 12 (twelve) hours.        . ibandronate (BONIVA) 150 MG tablet Take 1 tablet (150 mg total) by mouth every 30 (thirty) days. Take in the morning with a full glass of water, on an empty stomach, and do not take anything else by mouth or lie down for the next 30 min.  1 tablet  6  . ipratropium (ATROVENT) 0.06 % nasal spray Place 2 sprays into the nose daily. Nasal spray for  runny nose symptoms. Use as needed.  15 mL  5  . lisinopril-hydrochlorothiazide (PRINZIDE,ZESTORETIC) 20-25 MG per tablet Take 1 tablet by mouth daily.  90 tablet  3  . travoprost, benzalkonium, (TRAVATAN) 0.004 % ophthalmic solution Place 1 drop into both eyes at bedtime.        . verapamil (VERELAN PM) 120 MG 24 hr capsule Take 1 capsule (120 mg total) by mouth at bedtime.  90 capsule  3    BP 130/70  Pulse 76  Temp 98.3 F (36.8 C) (Oral)  Wt 149 lb (67.586 kg)        Objective:   Physical Exam  Constitutional: She is oriented to person, place, and time. She appears well-developed and well-nourished.  HENT:  Head: Atraumatic.  Cardiovascular: Normal rate, regular rhythm and normal heart sounds.     Pulmonary/Chest: Effort normal and breath sounds normal. She has no wheezes.  Musculoskeletal: She exhibits no edema.  Neurological: She is alert and oriented to person, place, and time.        Assessment & Plan:

## 2012-06-27 ENCOUNTER — Encounter: Payer: Self-pay | Admitting: Internal Medicine

## 2012-06-27 ENCOUNTER — Ambulatory Visit (INDEPENDENT_AMBULATORY_CARE_PROVIDER_SITE_OTHER): Payer: Medicare Other | Admitting: Internal Medicine

## 2012-06-27 VITALS — BP 154/82 | HR 76 | Temp 97.7°F

## 2012-06-27 DIAGNOSIS — I10 Essential (primary) hypertension: Secondary | ICD-10-CM

## 2012-06-27 DIAGNOSIS — R42 Dizziness and giddiness: Secondary | ICD-10-CM

## 2012-06-27 LAB — POCT URINALYSIS DIPSTICK
Nitrite, UA: NEGATIVE
Protein, UA: NEGATIVE
Spec Grav, UA: 1.01
Urobilinogen, UA: 0.2

## 2012-06-27 MED ORDER — CEFUROXIME AXETIL 250 MG PO TABS: 250.0000 mg | ORAL_TABLET | Freq: Two times a day (BID) | ORAL | Status: AC

## 2012-06-27 MED ORDER — VALSARTAN-HYDROCHLOROTHIAZIDE 160-12.5 MG PO TABS: 1.0000 | ORAL_TABLET | Freq: Every day | ORAL | Status: DC

## 2012-06-27 MED ORDER — MECLIZINE HCL 12.5 MG PO TABS: 12.5000 mg | ORAL_TABLET | Freq: Three times a day (TID) | ORAL | Status: AC | PRN

## 2012-06-27 NOTE — Patient Instructions (Addendum)
Please complete the following lab tests before your next follow up appointment: BMET - 401.9 

## 2012-06-27 NOTE — Assessment & Plan Note (Signed)
78 year old white female with nonspecific dizziness. Her symptoms may be attributable to benign positional vertigo. She is somewhat symptomatic with provocative maneuvers. Trial of meclizine 12.5 mg every 8 hours as needed.  Urinalysis shows positive leukocytes. Unclear whether UTI contributing to her symptomatology.. Treatment with cefuroxime 250 mg twice a day for 5 days.    Patient advised to call office if symptoms persist or worsen.

## 2012-06-27 NOTE — Assessment & Plan Note (Signed)
Patient experiencing persistent dry cough. Discontinue ACE inhibitor/HCTZ. Switch to Diovan 160/12.5 mg once daily.

## 2012-06-27 NOTE — Progress Notes (Signed)
Subjective:    Patient ID: [name], female    DOB: 04-20-1923, 78 y.o.   MRN: [id number]  HPI  78 year old white female with history of hypertension and hyperparathyroidism complains of mild dizziness over the last 24-48 hours. She describes feeling "wobbly". No specific trigger. Her symptoms were worse yesterday afternoon. Today her symptoms have been intermittent. She describes mild nausea. She denies any palpitations or chest pain.  Her symptoms have been disconcerting. She has noticed her blood pressure is slightly higher than normal. She has persistent nocturnal dry cough.  Review of Systems Negative for polyuria, dysuria.  Past Medical History  Diagnosis Date  . Anxiety   . Hypertension   . Glaucoma   . ANXIETY 12/31/2009  . CHEST PAIN 12/08/2004  . FATIGUE 01/09/2011  . GLUCOMA 12/31/2009  . HYPERTENSION 12/31/2009  . LEFT BUNDLE BRANCH BLOCK 01/03/2010  . MALIGNANT MELANOMA, HX OF 01/03/2010  . MITRAL VALVE PROLAPSE 12/31/2009  . Glaucoma     History   Social History  . Marital Status: Widowed    Spouse Name: N/A    Number of Children: N/A  . Years of Education: N/A   Occupational History  . Not on file.   Social History Main Topics  . Smoking status: Never Smoker   . Smokeless tobacco: Never Used   Comment: Married,, lives wirh spouse (2nd marriage in 2009, 1st widowed in 1979), husband kesley mullens enjoys golf, not much aerobic activity. spends time in [place] part of the year  . Alcohol Use:   . Drug Use:   . Sexually Active:    Other Topics Concern  . Not on file   Social History Narrative   Lives with spouse (2nd marriage in 2009, 1st widowed in 1979) husband-- Loy Little golf, not much aerobic activity--spends time in [place] part of the year.    Past Surgical History  Procedure Date  . Eye surgery 1960  . Tonsillectomy 1934  . Breast surgery 1950    surgery    Family History  Problem Relation Age of Onset  . Heart disease Mother   .  Kidney disease Father   . Heart attack Sister   . Cancer Brother     lung  . Hypertension Other   . Hyperlipidemia Other     Allergies  Allergen Reactions  . Codeine     Current Outpatient Prescriptions on File Prior to Visit  Medication Sig Dispense Refill  . Cholecalciferol (VITAMIN D) 2000 UNITS CAPS Take 1 capsule by mouth daily.        . dorzolamide-timolol (COSOPT) 22.3-6.8 MG/ML ophthalmic solution Place 1 drop into both eyes every 12 (twelve) hours.        . ibandronate (BONIVA) 150 MG tablet Take 1 tablet (150 mg total) by mouth every 30 (thirty) days. Take in the morning with a full glass of water, on an empty stomach, and do not take anything else by mouth or lie down for the next 30 min.  1 tablet  6  . ipratropium (ATROVENT) 0.06 % nasal spray Place 2 sprays into the nose daily. Nasal spray for runny nose symptoms. Use as needed.  15 mL  5  . travoprost, benzalkonium, (TRAVATAN) 0.004 % ophthalmic solution Place 1 drop into both eyes at bedtime.        . verapamil (VERELAN PM) 120 MG 24 hr capsule Take 1 capsule (120 mg total) by mouth at bedtime.  90 capsule  3  . valsartan-hydrochlorothiazide (DIOVAN-HCT) 160-12.5  MG per tablet Take 1 tablet by mouth daily.  30 tablet  3    BP 154/82  Pulse 76  Temp 97.7 F (36.5 C) (Oral)       Objective:   Physical Exam  Constitutional: She is oriented to person, place, and time. She appears well-developed and well-nourished.  HENT:  Head: Normocephalic and atraumatic.  Right Ear: External ear normal.  Left Ear: External ear normal.  Eyes: EOM are normal. Pupils are equal, round, and reactive to light.       No nystagmus  Neck: Neck supple.       No carotid bruit  Cardiovascular: Normal rate, regular rhythm and normal heart sounds.   No murmur heard. Pulmonary/Chest: Effort normal and breath sounds normal. She has no wheezes.  Neurological: She is alert and oriented to person, place, and time. She displays normal  reflexes. She exhibits normal muscle tone. She displays a negative Romberg sign. Coordination normal.  Skin: Skin is warm and dry.  Psychiatric: She has a normal mood and affect. Her behavior is normal.        Assessment & Plan:

## 2012-06-28 ENCOUNTER — Telehealth: Payer: Self-pay | Admitting: Internal Medicine

## 2012-06-28 DIAGNOSIS — I10 Essential (primary) hypertension: Secondary | ICD-10-CM

## 2012-06-28 NOTE — Telephone Encounter (Signed)
Pt called and said that she rcvd a call from nurse re: ua results and uti. Pt is req call back from nurse to discuss.

## 2012-06-28 NOTE — Telephone Encounter (Signed)
Pt just wanted to know what was positive on her u/a.  Told pt small leuks and blood

## 2012-06-29 LAB — URINE CULTURE

## 2012-07-23 ENCOUNTER — Other Ambulatory Visit (INDEPENDENT_AMBULATORY_CARE_PROVIDER_SITE_OTHER): Payer: Medicare Other

## 2012-07-23 DIAGNOSIS — I10 Essential (primary) hypertension: Secondary | ICD-10-CM

## 2012-07-23 LAB — BASIC METABOLIC PANEL
BUN: 12 mg/dL (ref 6–23)
CO2: 29 mEq/L (ref 19–32)
GFR: 67.26 mL/min (ref >60.00)
Glucose, Bld: 92 mg/dL (ref 70–99)
Potassium: 4.4 mEq/L (ref 3.5–5.1)

## 2012-07-30 ENCOUNTER — Encounter: Payer: Self-pay | Admitting: Internal Medicine

## 2012-07-30 ENCOUNTER — Ambulatory Visit (INDEPENDENT_AMBULATORY_CARE_PROVIDER_SITE_OTHER): Payer: Medicare Other | Admitting: Internal Medicine

## 2012-07-30 VITALS — BP 144/72 | HR 70 | Temp 98.3°F | Wt 149.0 lb

## 2012-07-30 DIAGNOSIS — I10 Essential (primary) hypertension: Secondary | ICD-10-CM

## 2012-07-30 MED ORDER — VALSARTAN-HYDROCHLOROTHIAZIDE 160-12.5 MG PO TABS: 1.0000 | ORAL_TABLET | Freq: Every day | ORAL | Status: DC

## 2012-07-30 MED ORDER — IBANDRONATE SODIUM 150 MG PO TABS: 150.0000 mg | ORAL_TABLET | ORAL | Status: DC

## 2012-07-30 NOTE — Patient Instructions (Addendum)
Please complete the following lab tests before your next follow up appointment: BMET - 401.9 

## 2012-07-30 NOTE — Progress Notes (Signed)
Subjective:    Patient ID: [name], female    DOB: 1923/11/12, 78 y.o.   MRN: [id number]  HPI  78 year old white female with history of hypertension, hypercalcemia and hyperparathyroidism for routine followup. At previous visit patient was feeling dizzy and found to have urinary tract infection. Patient treated with cefuroxime 250 mg twice daily. Patient states dizziness promptly resolved after taking antibiotics.  Patient was also switched from ACE inhibitor to ARB. Her cough has pretty much disappeared. She has mild cough in the morning from postnasal drip. She has been monitoring her blood pressure at home. Her systolic blood pressure readings are in the 120s.  Review of Systems Negative for dizziness  Past Medical History  Diagnosis Date  . Anxiety   . Hypertension   . Glaucoma   . ANXIETY 12/31/2009  . CHEST PAIN 12/08/2004  . FATIGUE 01/09/2011  . GLUCOMA 12/31/2009  . HYPERTENSION 12/31/2009  . LEFT BUNDLE BRANCH BLOCK 01/03/2010  . MALIGNANT MELANOMA, HX OF 01/03/2010  . MITRAL VALVE PROLAPSE 12/31/2009  . Glaucoma     History   Social History  . Marital Status: Widowed    Spouse Name: N/A    Number of Children: N/A  . Years of Education: N/A   Occupational History  . Not on file.   Social History Main Topics  . Smoking status: Never Smoker   . Smokeless tobacco: Never Used   Comment: Married,, lives wirh spouse (2nd marriage in 2009, 1st widowed in 1979), husband oma marzan enjoys golf, not much aerobic activity. spends time in [place] part of the year  . Alcohol Use:   . Drug Use:   . Sexually Active:    Other Topics Concern  . Not on file   Social History Narrative   Lives with spouse (2nd marriage in 2009, 1st widowed in 1979) husband-- [employer]sec[employer]ch golf, not much aerobic activity--spends time in [place] part of the year.    Past Surgical History  Procedure Date  . Eye surgery 1960  . Tonsillectomy 1934  . Breast surgery 1950    surgery     Family History  Problem Relation Age of Onset  . Heart disease Mother   . Kidney disease Father   . Heart attack Sister   . Cancer Brother     lung  . Hypertension Other   . Hyperlipidemia Other     Allergies  Allergen Reactions  . Codeine     Current Outpatient Prescriptions on File Prior to Visit  Medication Sig Dispense Refill  . Cholecalciferol (VITAMIN D) 2000 UNITS CAPS Take 1 capsule by mouth daily.        . dorzolamide-timolol (COSOPT) 22.3-6.8 MG/ML ophthalmic solution Place 1 drop into both eyes every 12 (twelve) hours.        [name] Kitchen ipratropium (ATROVENT) 0.06 % nasal spray Place 2 sprays into the nose daily. Nasal spray for runny nose symptoms. Use as needed.  15 mL  5  . travoprost, benzalkonium, (TRAVATAN) 0.004 % ophthalmic solution Place 1 drop into both eyes at bedtime.        . verapamil (VERELAN PM) 120 MG 24 hr capsule Take 1 capsule (120 mg total) by mouth at bedtime.  90 capsule  3  . DISCONTD: valsartan-hydrochlorothiazide (DIOVAN-HCT) 160-12.5 MG per tablet Take 1 tablet by mouth daily.  30 tablet  3    BP 144/72  Pulse 70  Temp 98.3 F (36.8 C) (Oral)  Wt 149 lb (67.586 kg)  Objective:   Physical Exam  Constitutional: She is oriented to person, place, and time. She appears well-developed and well-nourished.  Cardiovascular: Normal rate, regular rhythm and normal heart sounds.   Pulmonary/Chest: Effort normal. She has wheezes.  Musculoskeletal: She exhibits no edema.  Neurological: She is alert and oriented to person, place, and time.  Skin: Skin is dry.  Psychiatric: She has a normal mood and affect. Her behavior is normal.          Assessment & Plan:

## 2012-07-30 NOTE — Assessment & Plan Note (Signed)
Well-controlled on Diovan HCTZ 160/12.5 mg once daily. Her electrolytes and kidney function are stable. Reassess in 6 months.  BP: 144/72 mmHg  Lab Results  Component Value Date   CREATININE 0.9 07/23/2012

## 2012-08-19 ENCOUNTER — Ambulatory Visit (INDEPENDENT_AMBULATORY_CARE_PROVIDER_SITE_OTHER): Payer: Medicare Other

## 2012-08-19 DIAGNOSIS — Z23 Encounter for immunization: Secondary | ICD-10-CM

## 2012-10-10 ENCOUNTER — Other Ambulatory Visit: Payer: Self-pay | Admitting: *Deleted

## 2012-10-10 MED ORDER — IBANDRONATE SODIUM 150 MG PO TABS: 150.0000 mg | ORAL_TABLET | ORAL | Status: DC

## 2012-10-15 ENCOUNTER — Ambulatory Visit (INDEPENDENT_AMBULATORY_CARE_PROVIDER_SITE_OTHER): Payer: Medicare Other | Admitting: Internal Medicine

## 2012-10-15 ENCOUNTER — Encounter: Payer: Self-pay | Admitting: Internal Medicine

## 2012-10-15 ENCOUNTER — Telehealth: Payer: Self-pay | Admitting: Internal Medicine

## 2012-10-15 VITALS — BP 164/82 | HR 76 | Temp 97.8°F | Wt 151.0 lb

## 2012-10-15 DIAGNOSIS — I10 Essential (primary) hypertension: Secondary | ICD-10-CM

## 2012-10-15 DIAGNOSIS — E213 Hyperparathyroidism, unspecified: Secondary | ICD-10-CM

## 2012-10-15 MED ORDER — VERAPAMIL HCL ER 180 MG PO CP24: 180.0000 mg | ORAL_CAPSULE | Freq: Every day | ORAL | Status: DC

## 2012-10-15 NOTE — Assessment & Plan Note (Signed)
BP is suboptimally controlled. Increase verapamil SR to 180 mg once daily.  BP: 164/82 mmHg   Lab Results  Component Value Date   CREATININE 0.9 07/23/2012

## 2012-10-15 NOTE — Telephone Encounter (Signed)
Pharmacy informed tabs ok as long as 180mg .

## 2012-10-15 NOTE — Progress Notes (Signed)
Subjective:    Patient ID: [name], female    DOB: 10-28-1923, 78 y.o.   MRN: [id number]  HPI  78 year old white female with hypertension and primary hyperparathyroidism for follow up. Patient is monitoring her blood pressure at home. Her systolic blood pressure readings are in the 140s.  Patient has not been feeling herself over last 1-2 weeks. She has been taking some antibiotics after having a root canal on her left upper molar.  She also reports taking some motrin for pain for several days.  It upset her stomach.  Patient was seen by her endocrinologist in September of 2013. The patient is tolerating Boniva without difficulty.  She also had cataract surgery.  Ophthalmologist also performed procedure on her lower eyelid due to previous complication from chalazion surgery. Her eyelashes were growing inward. She reports having some type of laser surgery.  Review of Systems Negative for chest pain    Past Medical History  Diagnosis Date  . Anxiety   . Hypertension   . Glaucoma(365)   . ANXIETY 12/31/2009  . CHEST PAIN 12/08/2004  . FATIGUE 01/09/2011  . GLUCOMA 12/31/2009  . HYPERTENSION 12/31/2009  . LEFT BUNDLE BRANCH BLOCK 01/03/2010  . MALIGNANT MELANOMA, HX OF 01/03/2010  . MITRAL VALVE PROLAPSE 12/31/2009  . Glaucoma(365)     History   Social History  . Marital Status: Widowed    Spouse Name: N/A    Number of Children: N/A  . Years of Education: N/A   Occupational History  . Not on file.   Social History Main Topics  . Smoking status: Never Smoker   . Smokeless tobacco: Never Used     Comment: Married,, lives wirh spouse (2nd marriage in 2009, 1st widowed in 1979), husband mykaela arena enjoys golf, not much aerobic activity. spends time in [place] part of the year  . Alcohol Use:   . Drug Use:   . Sexually Active:    Other Topics Concern  . Not on file   Social History Narrative   Lives with spouse (2nd marriage in 2009, 1st widowed in 1979) husband-- Donnalynn Wheeless golf, not much aerobic activity--spends time in [place] part of the year.    Past Surgical History  Procedure Date  . Eye surgery 1960  . Tonsillectomy 1934  . Breast surgery 1950    surgery    Family History  Problem Relation Age of Onset  . Heart disease Mother   . Kidney disease Father   . Heart attack Sister   . Cancer Brother     lung  . Hypertension Other   . Hyperlipidemia Other     Allergies  Allergen Reactions  . Codeine     Current Outpatient Prescriptions on File Prior to Visit  Medication Sig Dispense Refill  . Cholecalciferol (VITAMIN D) 2000 UNITS CAPS Take 1 capsule by mouth daily.        . dorzolamide-timolol (COSOPT) 22.3-6.8 MG/ML ophthalmic solution Place 1 drop into both eyes every 12 (twelve) hours.        . ibandronate (BONIVA) 150 MG tablet Take 1 tablet (150 mg total) by mouth every 30 (thirty) days. Take in the morning with a full glass of water, on an empty stomach, and do not take anything else by mouth or lie down for the next 30 min.  3 tablet  4  . ipratropium (ATROVENT) 0.06 % nasal spray Place 2 sprays into the nose daily. Nasal spray for runny nose symptoms. Use as needed.  15 mL  5  . travoprost, benzalkonium, (TRAVATAN) 0.004 % ophthalmic solution Place 1 drop into both eyes at bedtime.        . valsartan-hydrochlorothiazide (DIOVAN-HCT) 160-12.5 MG per tablet Take 1 tablet by mouth daily.  90 tablet  1  . [DISCONTINUED] verapamil (VERELAN PM) 120 MG 24 hr capsule Take 1 capsule (120 mg total) by mouth at bedtime.  90 capsule  3    BP 164/82  Pulse 76  Temp 97.8 F (36.6 C) (Oral)  Wt 151 lb (68.493 kg)    Objective:   Physical Exam  Constitutional: She is oriented to person, place, and time. She appears well-developed and well-nourished.  Cardiovascular: Normal rate, regular rhythm and normal heart sounds.   Pulmonary/Chest: Effort normal and breath sounds normal. She has no wheezes.  Musculoskeletal: She exhibits no  edema.  Neurological: She is alert and oriented to person, place, and time. No cranial nerve deficit.  Skin: Skin is warm and dry.  Psychiatric: She has a normal mood and affect. Her behavior is normal.          Assessment & Plan:

## 2012-10-15 NOTE — Telephone Encounter (Signed)
[name] pharmacy called will question re: verapamil (VERELAN PM) 180 MG 24 hr capsule  Pt said that she has been on tablets 120mg  and that pt was told that Dr Artist Pais was increasing med but 180mg  caps were sent to pharmacy instead of tablets. Pharmacy does not have caps in stock. Is it ok to use tablets instead?  Pls call pharmacist.

## 2012-10-15 NOTE — Assessment & Plan Note (Signed)
Calcium levels stable.  Continue Boniva.

## 2012-10-23 ENCOUNTER — Telehealth: Payer: Self-pay | Admitting: Internal Medicine

## 2012-10-23 NOTE — Telephone Encounter (Signed)
Pt aware, she is asymptomatic

## 2012-10-23 NOTE — Telephone Encounter (Signed)
Patient called because she received a letter from pharmacy regarding possible drug interaction  Between Verapamil and Dorzolamide.  [name] not home at time of call back.  Husband says she is not having current side effects but is disturbed about pharmacy letter. Requests MD call back  to reassure [name] if it is OK to continue taking both medications.

## 2012-10-23 NOTE — Telephone Encounter (Signed)
Potential interaction is if she were getting IV verapamil.  If tolerating medication w/o any signs of low blood pressure or low heart rate, I would continue taking both medications.

## 2012-12-10 ENCOUNTER — Ambulatory Visit: Payer: Medicare Other | Admitting: Internal Medicine

## 2012-12-18 ENCOUNTER — Other Ambulatory Visit: Payer: Self-pay | Admitting: Internal Medicine

## 2013-01-07 ENCOUNTER — Other Ambulatory Visit (INDEPENDENT_AMBULATORY_CARE_PROVIDER_SITE_OTHER): Payer: Medicare Other

## 2013-01-07 DIAGNOSIS — I10 Essential (primary) hypertension: Secondary | ICD-10-CM

## 2013-01-07 LAB — BASIC METABOLIC PANEL
BUN: 13 mg/dL (ref 6–23)
CO2: 29 mEq/L (ref 19–32)
Calcium: 10.2 mg/dL (ref 8.4–10.5)
Creatinine, Ser: 0.9 mg/dL (ref 0.4–1.2)

## 2013-01-07 LAB — CBC WITH DIFFERENTIAL/PLATELET
Basophils Absolute: 0 10*3/uL (ref 0.0–0.1)
Basophils Relative: 0.2 % (ref 0.0–3.0)
Eosinophils Absolute: 0.2 10*3/uL (ref 0.0–0.7)
Lymphocytes Relative: 20.6 % (ref 12.0–46.0)
MCHC: 35.8 g/dL (ref 30.0–36.0)
Monocytes Absolute: 0.7 10*3/uL (ref 0.1–1.0)
Neutrophils Relative %: 65.1 % (ref 43.0–77.0)
Platelets: 177 10*3/uL (ref 150.0–400.0)
RBC: 3.82 Mil/uL — ABNORMAL LOW (ref 3.87–5.11)
RDW: 14 % (ref 11.5–14.6)

## 2013-01-08 ENCOUNTER — Other Ambulatory Visit: Payer: Self-pay | Admitting: *Deleted

## 2013-01-08 MED ORDER — POTASSIUM CHLORIDE ER 10 MEQ PO TBCR: 10.0000 meq | EXTENDED_RELEASE_TABLET | Freq: Every day | ORAL | Status: DC

## 2013-01-14 ENCOUNTER — Encounter: Payer: Self-pay | Admitting: Internal Medicine

## 2013-01-14 ENCOUNTER — Ambulatory Visit (INDEPENDENT_AMBULATORY_CARE_PROVIDER_SITE_OTHER): Payer: Medicare Other | Admitting: Internal Medicine

## 2013-01-14 VITALS — BP 152/80 | HR 68 | Temp 97.9°F | Wt 152.0 lb

## 2013-01-14 DIAGNOSIS — Z0000 Encounter for general adult medical examination without abnormal findings: Secondary | ICD-10-CM

## 2013-01-14 DIAGNOSIS — Z1231 Encounter for screening mammogram for malignant neoplasm of breast: Secondary | ICD-10-CM

## 2013-01-14 DIAGNOSIS — M79609 Pain in unspecified limb: Secondary | ICD-10-CM

## 2013-01-14 DIAGNOSIS — M79602 Pain in left arm: Secondary | ICD-10-CM

## 2013-01-14 DIAGNOSIS — I10 Essential (primary) hypertension: Secondary | ICD-10-CM

## 2013-01-14 DIAGNOSIS — E876 Hypokalemia: Secondary | ICD-10-CM

## 2013-01-14 LAB — BASIC METABOLIC PANEL
Calcium: 10.6 mg/dL — ABNORMAL HIGH (ref 8.4–10.5)
Creatinine, Ser: 0.9 mg/dL (ref 0.4–1.2)

## 2013-01-14 MED ORDER — VALSARTAN-HYDROCHLOROTHIAZIDE 160-12.5 MG PO TABS: 1.0000 | ORAL_TABLET | Freq: Every day | ORAL | Status: DC

## 2013-01-14 MED ORDER — DICLOFENAC SODIUM 1 % TD GEL: 2.0000 g | Freq: Three times a day (TID) | TRANSDERMAL | Status: DC

## 2013-01-14 MED ORDER — VERAPAMIL HCL ER 180 MG PO CP24: 180.0000 mg | ORAL_CAPSULE | Freq: Every day | ORAL | Status: DC

## 2013-01-14 NOTE — Progress Notes (Signed)
Subjective:    Patient ID: [name], female    DOB: Mar 24, 1923, 79 y.o.   MRN: [id number]  HPI  79 year old white female with history of hypertension, hyperparathyroidism and osteopenia for followup. At previous visit patient's verapamil dose was increased. Patient reports her home blood pressure readings significantly improved- they from 120  - 140 systolic.  Patient complains of intermittent left upper arm pain. 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 She has intermittent left arm pain. Symptoms are not related to exertion. She denies any associated chest pain or shortness of breath.   Review of Systems See HPI  Past Medical History  Diagnosis Date  . Anxiety   . Hypertension   . Glaucoma(365)   . ANXIETY 12/31/2009  . CHEST PAIN 12/08/2004  . FATIGUE 01/09/2011  . GLUCOMA 12/31/2009  . HYPERTENSION 12/31/2009  . LEFT BUNDLE BRANCH BLOCK 01/03/2010  . MALIGNANT MELANOMA, HX OF 01/03/2010  . MITRAL VALVE PROLAPSE 12/31/2009  . Glaucoma(365)     History   Social History  . Marital Status: Widowed    Spouse Name: N/A    Number of Children: N/A  . Years of Education: N/A   Occupational History  . Not on file.   Social History Main Topics  . Smoking status: Never Smoker   . Smokeless tobacco: Never Used     Comment: Married,, lives wirh spouse (2nd marriage in 2009, 1st widowed in 1979), husband leanna hamid enjoys golf, not much aerobic activity. spends time in [place] part of the year  . Alcohol Use:   . Drug Use:   . Sexually Active:    Other Topics Concern  . Not on file   Social History Narrative   Lives with spouse (2nd marriage in 2009, 1st widowed in 1979) husband-- Christianna Belmonte   Enjoys golf, not much aerobic activity--spends time in [place] part of the year.          Past Surgical History  Procedure Laterality Date  . Eye surgery  1960  . Tonsillectomy  1934  . Breast surgery  1950    surgery     Family History  Problem Relation Age of Onset  . Heart disease Mother   . Kidney disease Father   . Heart attack Sister   . Cancer Brother     lung  . Hypertension Other   . Hyperlipidemia Other     Allergies  Allergen Reactions  . Codeine     Current Outpatient Prescriptions on File Prior to Visit  Medication Sig Dispense Refill  . Cholecalciferol (VITAMIN D) 2000 UNITS CAPS Take 1 capsule by mouth daily.        . dorzolamide-timolol (COSOPT) 22.3-6.8 MG/ML ophthalmic solution Place 1 drop into both eyes every 12 (twelve) hours.        . ibandronate (BONIVA) 150 MG tablet Take 1 tablet (150 mg total) by mouth every 30 (thirty) days. Take in the morning with a full glass of water, on an empty stomach, and do not take anything else by mouth or lie down for the next 30 min.  3 tablet  4  . ipratropium (ATROVENT) 0.06 % nasal spray USE 2 SPRAYS INTO EACH NOSTRIL ONCE DAILY AS NEEDED FOR RUNNY NOSE SYMPTOMS  15 mL  5  . potassium chloride (K-DUR) 10 MEQ tablet Take 1 tablet (10 mEq total) by mouth daily.  90 tablet  1  . travoprost, benzalkonium, (TRAVATAN) 0.004 % ophthalmic solution Place  1 drop into both eyes at bedtime.         No current facility-administered medications on file prior to visit.    BP 152/80  Pulse 68  Temp(Src) 97.9 F (36.6 C) (Oral)  Wt 152 lb (68.947 kg)  BMI 24.55 kg/m2       Objective:   Physical Exam  Constitutional: She is oriented to person, place, and time. She appears well-developed and well-nourished.  HENT:  Head: Normocephalic and atraumatic.  Cardiovascular: Normal rate, regular rhythm and normal heart sounds.   No murmur heard. Pulmonary/Chest: Effort normal and breath sounds normal. She has no wheezes.  Musculoskeletal: She exhibits no edema.  Mild left deltoid tenderness  Neurological: She is alert and oriented to person, place, and time. No cranial nerve deficit.  Psychiatric: She has a normal mood and affect. Her behavior is  normal.          Assessment & Plan:

## 2013-01-14 NOTE — Assessment & Plan Note (Signed)
Patient experiences intermittent left upper arm pain ever since she fell 2 years ago. Range of motion is normal. I suspect bursitis versus other muscle injury. I suggest rest and use of Voltaren gel 3 times a day as needed.

## 2013-01-14 NOTE — Assessment & Plan Note (Signed)
Improved.  Continue current mediation regimen. Home BP readings between 120's to 140's. Patient encouraged to monitor BP at home. BP: 152/80 mmHg  Lab Results  Component Value Date   CREATININE 0.9 01/07/2013

## 2013-02-10 ENCOUNTER — Other Ambulatory Visit: Payer: Self-pay | Admitting: Internal Medicine

## 2013-02-21 ENCOUNTER — Ambulatory Visit
Admission: RE | Admit: 2013-02-21 | Discharge: 2013-02-21 | Disposition: A | Discharge status: Home / Self Care | Payer: Medicare Other | Source: Ambulatory Visit | Attending: Internal Medicine | Admitting: Internal Medicine

## 2013-02-21 DIAGNOSIS — Z1231 Encounter for screening mammogram for malignant neoplasm of breast: Secondary | ICD-10-CM

## 2013-03-28 ENCOUNTER — Encounter: Payer: Self-pay | Admitting: Family Medicine

## 2013-03-28 ENCOUNTER — Ambulatory Visit (INDEPENDENT_AMBULATORY_CARE_PROVIDER_SITE_OTHER): Payer: Medicare Other | Admitting: Family Medicine

## 2013-03-28 VITALS — BP 138/82 | HR 74 | Temp 98.0°F | Wt 154.0 lb

## 2013-03-28 DIAGNOSIS — J069 Acute upper respiratory infection, unspecified: Secondary | ICD-10-CM

## 2013-03-28 MED ORDER — BENZONATATE 100 MG PO CAPS: 100.0000 mg | ORAL_CAPSULE | Freq: Three times a day (TID) | ORAL | Status: DC | PRN

## 2013-03-28 NOTE — Progress Notes (Signed)
Chief Complaint  Patient presents with  . Laryngitis    HPI:  Acute visit for sore throat: -started: 2 days ago -symptoms:nasal congestion, sore throat, drainage in throat, cough, sneezing, laryngitis -denies:fever, SOB, NVD, tooth pain -has tried: gargling with salt water -sick contacts: none known -Hx of: no hx chronic lung problems or allergiers  ROS: See pertinent positives and negatives per HPI.  Past Medical History  Diagnosis Date  . Anxiety   . Hypertension   . Glaucoma(365)   . ANXIETY 12/31/2009  . CHEST PAIN 12/08/2004  . FATIGUE 01/09/2011  . GLUCOMA 12/31/2009  . HYPERTENSION 12/31/2009  . LEFT BUNDLE BRANCH BLOCK 01/03/2010  . MALIGNANT MELANOMA, HX OF 01/03/2010  . MITRAL VALVE PROLAPSE 12/31/2009  . Glaucoma(365)     Family History  Problem Relation Age of Onset  . Heart disease Mother   . Kidney disease Father   . Heart attack Sister   . Cancer Brother     lung  . Hypertension Other   . Hyperlipidemia Other     History   Social History  . Marital Status: Widowed    Spouse Name: N/A    Number of Children: N/A  . Years of Education: N/A   Social History Main Topics  . Smoking status: Never Smoker   . Smokeless tobacco: Never Used     Comment: Married,, lives wirh spouse (2nd marriage in 2009, 1st widowed in 1979), husband jetaime pinnix enjoys golf, not much aerobic activity. spends time in [place] part of the year  . Alcohol Use:   . Drug Use:   . Sexually Active:    Other Topics Concern  . None   Social History Narrative   Lives with spouse (2nd marriage in 2009, 1st widowed in 1979) husband-- Virtie Bungert   Enjoys golf, not much aerobic activity--spends time in [place] part of the year.          Current outpatient prescriptions:Cholecalciferol (VITAMIN D) 2000 UNITS CAPS, Take 1 capsule by mouth daily.  , Disp: , Rfl: ;  diclofenac sodium (VOLTAREN) 1 % GEL, Apply 2 g topically 3 (three) times daily., Disp: 2 Tube, Rfl: 1;  dorzolamide-timolol  (COSOPT) 22.3-6.8 MG/ML ophthalmic solution, Place 1 drop into both eyes every 12 (twelve) hours.  , Disp: , Rfl: ;  doxycycline (VIBRA-TABS) 100 MG tablet, Take 100 mg by mouth daily., Disp: , Rfl:  ibandronate (BONIVA) 150 MG tablet, TAKE ONE TABLET THE SAME DAY OF THE MONTH WITH A FULL GLASS OF WATER ON AN EMPTY STOMACH. DON'T LIE DOWN OR EAT ANYTHING ELSE FOR 30 MINUTES, Disp: 1 tablet, Rfl: 5;  ipratropium (ATROVENT) 0.06 % nasal spray, USE 2 SPRAYS INTO EACH NOSTRIL ONCE DAILY AS NEEDED FOR RUNNY NOSE SYMPTOMS, Disp: 15 mL, Rfl: 5 potassium chloride (K-DUR) 10 MEQ tablet, Take 1 tablet (10 mEq total) by mouth daily., Disp: 90 tablet, Rfl: 1;  travoprost, benzalkonium, (TRAVATAN) 0.004 % ophthalmic solution, Place 1 drop into both eyes at bedtime.  , Disp: , Rfl: ;  valsartan-hydrochlorothiazide (DIOVAN-HCT) 160-12.5 MG per tablet, Take 1 tablet by mouth daily., Disp: 90 tablet, Rfl: 1 verapamil (VERELAN PM) 180 MG 24 hr capsule, Take 1 capsule (180 mg total) by mouth at bedtime., Disp: 90 capsule, Rfl: 1;  benzonatate (TESSALON PERLES) 100 MG capsule, Take 1 capsule (100 mg total) by mouth 3 (three) times daily as needed for cough., Disp: 20 capsule, Rfl: 0  EXAM:  Filed Vitals:   03/28/13 1059  BP: 138/82  Pulse: 74  Temp: 98 F (36.7 C)    Body mass index is 24.87 kg/(m^2).  GENERAL: vitals reviewed and listed above, alert, oriented, appears well hydrated and in no acute distress  HEENT: atraumatic, conjunttiva clear, no obvious abnormalities on inspection of external nose and ears, normal appearance of ear canals and TMs, clear nasal congestion, mild post oropharyngeal erythema with PND, no tonsillar edema or exudate, no sinus TTP  NECK: no obvious masses on inspection  LUNGS: clear to auscultation bilaterally, no wheezes, rales or rhonchi, good air movement  CV: HRRR, no peripheral edema  MS: moves all extremities without noticeable abnormality  PSYCH: pleasant and cooperative,  no obvious depression or anxiety  ASSESSMENT AND PLAN:  Discussed the following assessment and plan:  Upper respiratory infection  -discussed this is likely viral and supportive care and return precautions discussed. She is very sensitive to medications per her report. Tessalon Perles given and risks discussed. -Patient advised to return or notify a doctor immediately if symptoms worsen or persist or new concerns arise.  Patient Instructions  INSTRUCTIONS FOR UPPER RESPIRATORY INFECTION:  -plenty of rest and fluids  -nasal saline wash 2-3 times daily (use prepackaged nasal saline or bottled/distilled water if making your own)   -can use sinex or afrin nasal spray for drainage and nasal congestion - but do NOT use longer then 3-4 days  -can use tylenol or ibuprofen as directed for aches and sorethroat  -in the winter time, using a humidifier at night is helpful (please follow cleaning instructions)  -if you are taking a cough medication - use only as directed, may also try a teaspoon of honey to coat the throat and throat lozenges, warm liquids  -for sore throat, salt water gargles can help  -follow up if you have fevers, facial pain, tooth pain, difficulty breathing or are worsening or not getting better      Kriste Basque R.

## 2013-03-28 NOTE — Patient Instructions (Addendum)
INSTRUCTIONS FOR UPPER RESPIRATORY INFECTION:  -plenty of rest and fluids  -be very gentle with your voice - whisper, do not yell or talk loud  -nasal saline wash 2-3 times daily (use prepackaged nasal saline or bottled/distilled water if making your own)   -can use sinex or afrin nasal spray for drainage and nasal congestion - but do NOT use longer then 3-4 days  -can use tylenol or ibuprofen as directed for aches and sorethroat  -in the winter time, using a humidifier at night is helpful (please follow cleaning instructions)  -if you are taking a cough medication - use only as directed, may also try a teaspoon of honey to coat the throat and throat lozenges, warm liquids  -for sore throat, salt water gargles can help  -follow up if you have fevers, facial pain, tooth pain, difficulty breathing or are worsening or not getting better

## 2013-04-17 ENCOUNTER — Other Ambulatory Visit: Payer: Self-pay | Admitting: Internal Medicine

## 2013-04-17 DIAGNOSIS — R05 Cough: Secondary | ICD-10-CM

## 2013-04-17 DIAGNOSIS — E538 Deficiency of other specified B group vitamins: Secondary | ICD-10-CM

## 2013-04-22 ENCOUNTER — Ambulatory Visit (INDEPENDENT_AMBULATORY_CARE_PROVIDER_SITE_OTHER): Payer: Medicare Other | Admitting: Internal Medicine

## 2013-04-22 ENCOUNTER — Encounter: Payer: Self-pay | Admitting: Internal Medicine

## 2013-04-22 VITALS — BP 144/78 | Temp 98.2°F | Wt 149.0 lb

## 2013-04-22 DIAGNOSIS — R05 Cough: Secondary | ICD-10-CM

## 2013-04-22 MED ORDER — CEFUROXIME AXETIL 250 MG PO TABS: 250.0000 mg | ORAL_TABLET | Freq: Two times a day (BID) | ORAL | Status: DC

## 2013-04-22 NOTE — Progress Notes (Signed)
Subjective:    Patient ID: [name], female    DOB: [birth year], 79 y.o.   MRN: [id number]  HPI  79 year old white female previously seen by Dr. Selena Batten on 03/28/2013 for followup. Patient complained of intermittent cough and laryngitis. Patient presumed to have a viral upper respiratory infection. She reports her symptoms/cough improved for 2 weeks then symptoms returned. Patient's husband recently diagnosed with acute bronchitis. Patient has mild sinus congestion. Her cough is productive of greenish sputum.  She denies any shortness of breath. She complains of fatigue.  Review of Systems Negative for shortness of breath  Past Medical History  Diagnosis Date  . Anxiety   . Hypertension   . Glaucoma(365)   . ANXIETY 12/31/2009  . CHEST PAIN 12/08/2004  . FATIGUE 01/09/2011  . GLUCOMA 12/31/2009  . HYPERTENSION 12/31/2009  . LEFT BUNDLE BRANCH BLOCK 01/03/2010  . MALIGNANT MELANOMA, HX OF 01/03/2010  . MITRAL VALVE PROLAPSE 12/31/2009  . Glaucoma(365)     History   Social History  . Marital Status: Widowed    Spouse Name: N/A    Number of Children: N/A  . Years of Education: N/A   Occupational History  . Not on file.   Social History Main Topics  . Smoking status: Never Smoker   . Smokeless tobacco: Never Used     Comment: Married,, lives wirh spouse (2nd marriage in 2009, 1st widowed in 1979), husband kaya pottenger enjoys golf, not much aerobic activity. spends time in [place] part of the year  . Alcohol Use:   . Drug Use:   . Sexually Active:    Other Topics Concern  . Not on file   Social History Narrative   Lives with spouse (2nd marriage in 2009, 1st widowed in 1979) husband-- Bobbette Eakes   Enjoys golf, not much aerobic activity--spends time in [place] part of the year.          Past Surgical History  Procedure Laterality Date  . Eye surgery  1960  . Tonsillectomy  1934  . Breast surgery  1950    surgery    Family History  Problem Relation Age of Onset  .  Heart disease Mother   . Kidney disease Father   . Heart attack Sister   . Cancer Brother     lung  . Hypertension Other   . Hyperlipidemia Other     Allergies  Allergen Reactions  . Codeine     Current Outpatient Prescriptions on File Prior to Visit  Medication Sig Dispense Refill  . benzonatate (TESSALON PERLES) 100 MG capsule Take 1 capsule (100 mg total) by mouth 3 (three) times daily as needed for cough.  20 capsule  0  . Cholecalciferol (VITAMIN D) 2000 UNITS CAPS Take 1 capsule by mouth daily.        . diclofenac sodium (VOLTAREN) 1 % GEL Apply 2 g topically 3 (three) times daily.  2 Tube  1  . dorzolamide-timolol (COSOPT) 22.3-6.8 MG/ML ophthalmic solution Place 1 drop into both eyes every 12 (twelve) hours.        . ibandronate (BONIVA) 150 MG tablet TAKE ONE TABLET THE SAME DAY OF THE MONTH WITH A FULL GLASS OF WATER ON AN EMPTY STOMACH. DON'T LIE DOWN OR EAT ANYTHING ELSE FOR 30 MINUTES  1 tablet  5  . ipratropium (ATROVENT) 0.06 % nasal spray USE 2 SPRAYS INTO EACH NOSTRIL ONCE DAILY AS NEEDED FOR RUNNY NOSE SYMPTOMS  15 mL  5  . potassium chloride (  K-DUR) 10 MEQ tablet Take 1 tablet (10 mEq total) by mouth daily.  90 tablet  1  . travoprost, benzalkonium, (TRAVATAN) 0.004 % ophthalmic solution Place 1 drop into both eyes at bedtime.        . valsartan-hydrochlorothiazide (DIOVAN-HCT) 160-12.5 MG per tablet Take 1 tablet by mouth daily.  90 tablet  1  . verapamil (VERELAN PM) 180 MG 24 hr capsule Take 1 capsule (180 mg total) by mouth at bedtime.  90 capsule  1   No current facility-administered medications on file prior to visit.    BP 144/78  Temp(Src) 98.2 F (36.8 C) (Oral)  Wt 149 lb (67.586 kg)  BMI 24.06 kg/m2       Objective:   Physical Exam  Constitutional: She is oriented to person, place, and time. She appears well-developed and well-nourished.  HENT:  Head: Normocephalic and atraumatic.  Right Ear: External ear normal.  Left Ear: External ear  normal.  Mouth/Throat: No oropharyngeal exudate.  Oropharyngeal erythema  Cardiovascular: Normal rate, regular rhythm and normal heart sounds.   No murmur heard. Pulmonary/Chest: Effort normal and breath sounds normal. She has no wheezes.  Neurological: She is alert and oriented to person, place, and time. No cranial nerve deficit.          Assessment & Plan:

## 2013-04-22 NOTE — Assessment & Plan Note (Signed)
79 year old white female with chronic cough since 03/28/2013. Her initial symptoms presumed secondary to viral URI. Her symptoms worse with laying down. She has greenish sputum. I suspect her symptoms may be secondary to secondary infectious sinusitis. Treat with cefuroxime 250 mg twice daily for 10 days.  Patient advised to call office if symptoms persist or worsen.

## 2013-04-22 NOTE — Patient Instructions (Addendum)
Use mucinex twice daily as needed Please contact our office if your symptoms do not improve or gets worse.

## 2013-07-01 ENCOUNTER — Other Ambulatory Visit (INDEPENDENT_AMBULATORY_CARE_PROVIDER_SITE_OTHER): Payer: Medicare Other

## 2013-07-01 DIAGNOSIS — I10 Essential (primary) hypertension: Secondary | ICD-10-CM

## 2013-07-01 LAB — BASIC METABOLIC PANEL
CO2: 30 mEq/L (ref 19–32)
Calcium: 10.1 mg/dL (ref 8.4–10.5)
Creatinine, Ser: 1 mg/dL (ref 0.4–1.2)
Glucose, Bld: 91 mg/dL (ref 70–99)

## 2013-07-17 ENCOUNTER — Ambulatory Visit: Payer: Medicare Other

## 2013-07-21 ENCOUNTER — Ambulatory Visit: Payer: Medicare Other

## 2013-07-21 ENCOUNTER — Other Ambulatory Visit (INDEPENDENT_AMBULATORY_CARE_PROVIDER_SITE_OTHER): Payer: Medicare Other

## 2013-07-21 DIAGNOSIS — I10 Essential (primary) hypertension: Secondary | ICD-10-CM

## 2013-07-21 LAB — BASIC METABOLIC PANEL
BUN: 13 mg/dL (ref 6–23)
Creatinine, Ser: 0.8 mg/dL (ref 0.4–1.2)
GFR: 71.97 mL/min (ref >60.00)

## 2013-08-11 ENCOUNTER — Other Ambulatory Visit: Payer: Self-pay | Admitting: Internal Medicine

## 2013-08-29 ENCOUNTER — Ambulatory Visit (INDEPENDENT_AMBULATORY_CARE_PROVIDER_SITE_OTHER): Payer: Medicare Other

## 2013-08-29 DIAGNOSIS — Z23 Encounter for immunization: Secondary | ICD-10-CM

## 2013-09-23 ENCOUNTER — Ambulatory Visit: Payer: Medicare Other | Admitting: Internal Medicine

## 2013-10-08 ENCOUNTER — Other Ambulatory Visit: Payer: Self-pay | Admitting: Internal Medicine

## 2013-10-10 ENCOUNTER — Ambulatory Visit (INDEPENDENT_AMBULATORY_CARE_PROVIDER_SITE_OTHER): Payer: Medicare Other | Admitting: Internal Medicine

## 2013-10-10 ENCOUNTER — Encounter: Payer: Self-pay | Admitting: Internal Medicine

## 2013-10-10 VITALS — BP 132/64 | HR 72 | Temp 97.7°F | Ht 66.0 in | Wt 152.0 lb

## 2013-10-10 DIAGNOSIS — Z23 Encounter for immunization: Secondary | ICD-10-CM

## 2013-10-10 DIAGNOSIS — I10 Essential (primary) hypertension: Secondary | ICD-10-CM

## 2013-10-10 MED ORDER — POTASSIUM CHLORIDE ER 10 MEQ PO TBCR: 10.0000 meq | EXTENDED_RELEASE_TABLET | Freq: Every day | ORAL | Status: DC

## 2013-10-10 MED ORDER — IBANDRONATE SODIUM 150 MG PO TABS: ORAL_TABLET | ORAL | Status: DC

## 2013-10-10 NOTE — Progress Notes (Signed)
Pre-visit discussion using our clinic review tool. No additional management support is needed unless otherwise documented below in the visit note.  

## 2013-10-10 NOTE — Assessment & Plan Note (Signed)
BP is borderline.  Monitor at home for now. If systolic blood pressure consistently greater than 140, we discussed changing her hydrochlorothiazide to Maxzide. She has difficulty taking her potassium supplementation.

## 2013-10-10 NOTE — Progress Notes (Signed)
Subjective:    Patient ID: [name], female    DOB: Oct 08, 1923, 79 y.o.   MRN: [id number]  HPI  79 year old white female with history of hypertension, hyperparathyroidism and osteoporosis for followup. Overall patient has been doing well. She has been working with her dermatologist with facial rash. Unclear whether rash secondary to seborrheic dermatitis versus rosacea.  Hypertension-she occasionally checks her blood pressure at home. Systolic blood pressures are usually in the 140s and diastolic blood pressure in the 80s. Review of Systems Negative for chest pain,  No SOB    Past Medical History  Diagnosis Date  . Anxiety   . Hypertension   . Glaucoma   . ANXIETY 12/31/2009  . CHEST PAIN 12/08/2004  . FATIGUE 01/09/2011  . GLUCOMA 12/31/2009  . HYPERTENSION 12/31/2009  . LEFT BUNDLE BRANCH BLOCK 01/03/2010  . MALIGNANT MELANOMA, HX OF 01/03/2010  . MITRAL VALVE PROLAPSE 12/31/2009  . Glaucoma     History   Social History  . Marital Status: Widowed    Spouse Name: N/A    Number of Children: N/A  . Years of Education: N/A   Occupational History  . Not on file.   Social History Main Topics  . Smoking status: Never Smoker   . Smokeless tobacco: Never Used     Comment: Married,, lives wirh spouse (2nd marriage in 2009, 1st widowed in 1979), husband alvine mostafa enjoys golf, not much aerobic activity. spends time in [place] part of the year  . Alcohol Use:   . Drug Use:   . Sexual Activity:    Other Topics Concern  . Not on file   Social History Narrative   Lives with spouse (2nd marriage in 2009, 1st widowed in 1979) husband-- [name]   Enjoys golf, not much aerobic activity--spends time in [place] part of the year.          Past Surgical History  Procedure Laterality Date  . Eye surgery  1960  . Tonsillectomy  1934  . Breast surgery  1950    surgery    Family History  Problem Relation Age of Onset  . Heart disease Mother   . Kidney disease Father   .  Heart attack Sister   . Cancer Brother     lung  . Hypertension Other   . Hyperlipidemia Other     Allergies  Allergen Reactions  . Codeine     Current Outpatient Prescriptions on File Prior to Visit  Medication Sig Dispense Refill  . Cholecalciferol (VITAMIN D) 2000 UNITS CAPS Take 1 capsule by mouth daily.        . dorzolamide-timolol (COSOPT) 22.3-6.8 MG/ML ophthalmic solution Place 1 drop into both eyes every 12 (twelve) hours.        [name] Kitchen ipratropium (ATROVENT) 0.06 % nasal spray USE 2 SPRAYS INTO EACH NOSTRIL ONCE DAILY AS NEEDED FOR RUNNY NOSE SYMPTOMS  15 mL  5  . travoprost, benzalkonium, (TRAVATAN) 0.004 % ophthalmic solution Place 1 drop into both eyes at bedtime.        . valsartan-hydrochlorothiazide (DIOVAN-HCT) 160-12.5 MG per tablet TAKE ONE TABLET BY MOUTH ONCE DAILY  90 tablet  3  . verapamil (CALAN-SR) 180 MG CR tablet TAKE ONE TABLET AT BEDTIME  90 tablet  1   No current facility-administered medications on file prior to visit.    BP 132/64  Pulse 72  Temp(Src) 97.7 F (36.5 C) (Oral)  Ht 5\' 6"  (1.676 m)  Wt 152 lb (68.947 kg)  BMI 24.55 kg/m2    Objective:   Physical Exam  Constitutional: She is oriented to person, place, and time. She appears well-developed and well-nourished. No distress.  Cardiovascular: Normal rate, regular rhythm and normal heart sounds.   No murmur heard. Pulmonary/Chest: Effort normal and breath sounds normal. She has no wheezes.  Neurological: She is alert and oriented to person, place, and time. No cranial nerve deficit.  Skin:  Facial rash  Psychiatric: She has a normal mood and affect. Her behavior is normal.          Assessment & Plan:

## 2013-10-10 NOTE — Patient Instructions (Addendum)
Monitor your blood pressure at home as directed and contact our office with your readings in 1 month Please complete the following lab tests before your next follow up appointment: BMET, FLP, LFTs, TSH, CBCD - 401.9

## 2013-10-27 ENCOUNTER — Telehealth: Payer: Self-pay | Admitting: Internal Medicine

## 2013-10-27 NOTE — Telephone Encounter (Signed)
Patient Information:  Caller Name: Lenzie  Phone: [phone]  Patient: [name], [name]  Gender: Female  DOB: 1923/03/19  Age: 79 Years  PCP: Artist Pais Doe-Hyun Molly Maduro) (Adults only)  Office Follow Up:  Does the office need to follow up with this patient?: Yes  Instructions For The Office: Patient would like further instructions regarding blood pressure medications.  RN Note:  Patient reports she is also currently taking Doxycycline for rosacea symptoms. Patient reports she would like to know if she needs to make any adjustments to her current blood pressure medications.  Symptoms  Reason For Call & Symptoms: Elevated blood pressure. 10/26/13: BP 169/102, 10/27/13: 144/94  Reviewed Health History In EMR: Yes  Reviewed Medications In EMR: Yes  Reviewed Allergies In EMR: Yes  Reviewed Surgeries / Procedures: Yes  Date of Onset of Symptoms: 10/26/2013  Treatments Tried: HTN meds as prescribed  Treatments Tried Worked: No  Guideline(s) Used:  High Blood Pressure  Disposition Per Guideline:   See Within 2 Weeks in Office  Reason For Disposition Reached:   BP > 140/90 and is taking BP medications  Advice Given:  N/A  Patient Will Follow Care Advice:  YES

## 2014-02-09 ENCOUNTER — Other Ambulatory Visit: Payer: Self-pay | Admitting: Internal Medicine

## 2014-04-06 ENCOUNTER — Other Ambulatory Visit (INDEPENDENT_AMBULATORY_CARE_PROVIDER_SITE_OTHER): Payer: Medicare Other

## 2014-04-06 DIAGNOSIS — E785 Hyperlipidemia, unspecified: Secondary | ICD-10-CM

## 2014-04-06 DIAGNOSIS — I10 Essential (primary) hypertension: Secondary | ICD-10-CM

## 2014-04-06 DIAGNOSIS — E039 Hypothyroidism, unspecified: Secondary | ICD-10-CM

## 2014-04-06 LAB — HEPATIC FUNCTION PANEL
ALK PHOS: 51 U/L (ref 39–117)
ALT: 15 U/L (ref 0–35)
AST: 19 U/L (ref 0–37)
Albumin: 3.9 g/dL (ref 3.5–5.2)
Bilirubin, Direct: 0 mg/dL (ref 0.0–0.3)
TOTAL PROTEIN: 7.3 g/dL (ref 6.0–8.3)
Total Bilirubin: 0.8 mg/dL (ref 0.3–1.2)

## 2014-04-06 LAB — CBC WITH DIFFERENTIAL/PLATELET
BASOS ABS: 0 10*3/uL (ref 0.0–0.1)
Basophils Relative: 0.2 % (ref 0.0–3.0)
EOS ABS: 0.3 10*3/uL (ref 0.0–0.7)
Eosinophils Relative: 4.5 % (ref 0.0–5.0)
HCT: 38.7 % (ref 36.0–46.0)
Hemoglobin: 14.6 g/dL (ref 12.0–15.0)
LYMPHS PCT: 16.7 % (ref 12.0–46.0)
Lymphs Abs: 1.2 10*3/uL (ref 0.7–4.0)
MCHC: 37.8 g/dL (ref 30.0–36.0)
MCV: 109.2 fl — ABNORMAL HIGH (ref 78.0–100.0)
Monocytes Absolute: 0.9 10*3/uL (ref 0.1–1.0)
Monocytes Relative: 12.5 % — ABNORMAL HIGH (ref 3.0–12.0)
Neutro Abs: 4.8 10*3/uL (ref 1.4–7.7)
Neutrophils Relative %: 66.1 % (ref 43.0–77.0)
PLATELETS: 195 10*3/uL (ref 150.0–400.0)
RBC: 3.55 Mil/uL — AB (ref 3.87–5.11)
RDW: 13.9 % (ref 11.5–14.6)
WBC: 7.3 10*3/uL (ref 4.5–10.5)

## 2014-04-06 LAB — BASIC METABOLIC PANEL
BUN: 10 mg/dL (ref 6–23)
CHLORIDE: 101 meq/L (ref 96–112)
CO2: 29 meq/L (ref 19–32)
CREATININE: 0.8 mg/dL (ref 0.4–1.2)
Calcium: 10.2 mg/dL (ref 8.4–10.5)
GFR: 67.92 mL/min (ref >60.00)
GLUCOSE: 94 mg/dL (ref 70–99)
POTASSIUM: 4.3 meq/L (ref 3.5–5.1)
Sodium: 137 mEq/L (ref 135–145)

## 2014-04-06 LAB — LIPID PANEL
CHOLESTEROL: 177 mg/dL (ref 0–200)
HDL: 43.2 mg/dL (ref >39.00)
LDL Cholesterol: 116 mg/dL — ABNORMAL HIGH (ref 0–99)
TRIGLYCERIDES: 91 mg/dL (ref 0.0–149.0)
Total CHOL/HDL Ratio: 4
VLDL: 18.2 mg/dL (ref 0.0–40.0)

## 2014-04-06 LAB — TSH: TSH: 1.72 u[IU]/mL (ref 0.35–5.50)

## 2014-04-07 ENCOUNTER — Other Ambulatory Visit: Payer: Medicare Other

## 2014-04-08 ENCOUNTER — Other Ambulatory Visit: Payer: Self-pay | Admitting: Internal Medicine

## 2014-04-13 ENCOUNTER — Ambulatory Visit: Payer: Medicare Other | Admitting: Internal Medicine

## 2014-04-13 ENCOUNTER — Ambulatory Visit (INDEPENDENT_AMBULATORY_CARE_PROVIDER_SITE_OTHER): Payer: Medicare Other | Admitting: Internal Medicine

## 2014-04-13 ENCOUNTER — Encounter: Payer: Self-pay | Admitting: Internal Medicine

## 2014-04-13 VITALS — BP 142/74 | HR 96 | Temp 98.0°F | Ht 66.0 in | Wt 152.0 lb

## 2014-04-13 DIAGNOSIS — I10 Essential (primary) hypertension: Secondary | ICD-10-CM

## 2014-04-13 DIAGNOSIS — D7589 Other specified diseases of blood and blood-forming organs: Secondary | ICD-10-CM | POA: Insufficient documentation

## 2014-04-13 DIAGNOSIS — E213 Hyperparathyroidism, unspecified: Secondary | ICD-10-CM

## 2014-04-13 MED ORDER — POTASSIUM CHLORIDE ER 10 MEQ PO TBCR: 10.0000 meq | EXTENDED_RELEASE_TABLET | Freq: Every day | ORAL | Status: DC

## 2014-04-13 MED ORDER — VALSARTAN-HYDROCHLOROTHIAZIDE 160-12.5 MG PO TABS: 1.0000 | ORAL_TABLET | Freq: Every day | ORAL | Status: DC

## 2014-04-13 NOTE — Assessment & Plan Note (Signed)
Stable.  No change in medication. BP: 142/74 mmHg  Lab Results  Component Value Date   NA 137 04/06/2014   K 4.3 04/06/2014   CL 101 04/06/2014   CO2 29 04/06/2014   Lab Results  Component Value Date   CREATININE 0.8 04/06/2014

## 2014-04-13 NOTE — Progress Notes (Signed)
Subjective:    Patient ID: [name], female    DOB: [birth year], 80 y.o.   MRN: [id number]  HPI  80 year old white female with history of mild hyperparathyroidism, hypertension and osteopenia for routine followup. Patient followed by endocrinology regarding hyperparathyroidism. Patient currently being monitored with plans for possible parathyroid surgery should she experience rising calcium level, worsening osteoporosis or kidney stones.  Hypertension-stable  Recent blood work reviewed.  CBCD shows RBC macrocytosis and elevated MCHC.  Spherocytes also noted.  She drinks 1-2 glasses of wine per night.  Lab Results  Component Value Date   EPPIRJJO84 166 11/14/2011     Review of Systems Negative for dizziness.  No change in weight or appetite    Past Medical History  Diagnosis Date  . Anxiety   . Hypertension   . Glaucoma   . ANXIETY 12/31/2009  . CHEST PAIN 12/08/2004  . FATIGUE 01/09/2011  . GLUCOMA 12/31/2009  . HYPERTENSION 12/31/2009  . LEFT BUNDLE BRANCH BLOCK 01/03/2010  . MALIGNANT MELANOMA, HX OF 01/03/2010  . MITRAL VALVE PROLAPSE 12/31/2009  . Glaucoma     History   Social History  . Marital Status: Widowed    Spouse Name: N/A    Number of Children: N/A  . Years of Education: N/A   Occupational History  . Not on file.   Social History Main Topics  . Smoking status: Never Smoker   . Smokeless tobacco: Never Used  . Alcohol Use: Not on file  . Drug Use: Not on file  . Sexual Activity: Not on file   Other Topics Concern  . Not on file   Social History Narrative   Lives with spouse (2nd marriage in 2009, 1st widowed in 1979) husband-- Bisma Klett   Enjoys golf, not much aerobic activity--spends time in [place] part of the year.          Past Surgical History  Procedure Laterality Date  . Eye surgery  1960  . Tonsillectomy  1934  . Breast surgery  1950    surgery    Family History  Problem Relation Age of Onset  . Heart disease Mother   .  Kidney disease Father   . Heart attack Sister   . Cancer Brother     lung  . Hypertension Other   . Hyperlipidemia Other     Allergies  Allergen Reactions  . Codeine     Current Outpatient Prescriptions on File Prior to Visit  Medication Sig Dispense Refill  . Cholecalciferol (VITAMIN D) 2000 UNITS CAPS Take 1 capsule by mouth daily.        . dorzolamide-timolol (COSOPT) 22.3-6.8 MG/ML ophthalmic solution Place 1 drop into both eyes every 12 (twelve) hours.        . ibandronate (BONIVA) 150 MG tablet TAKE ONE TABLET THE SAME DAY OF THE MONTH WITH A FULL GLASS OF WATER ON AN EMPTY STOMACH. DON'T LIE DOWN OR EAT ANYTHING ELSE FOR 30 MINUTES  1 tablet  11  . ipratropium (ATROVENT) 0.06 % nasal spray USE 2 SPRAYS INTO EACH NOSTRIL ONCE DAILY AS NEEDED FOR RUNNY NOSE SYMPTOMS  15 mL  5  . travoprost, benzalkonium, (TRAVATAN) 0.004 % ophthalmic solution Place 1 drop into both eyes at bedtime.        . verapamil (CALAN-SR) 180 MG CR tablet TAKE ONE TABLET AT BEDTIME  90 tablet  1   No current facility-administered medications on file prior to visit.    BP 142/74  Pulse  96  Temp(Src) 98 F (36.7 C) (Oral)  Ht 5\' 6"  (1.676 m)  Wt 152 lb (68.947 kg)  BMI 24.55 kg/m2    Objective:   Physical Exam  Constitutional: She is oriented to person, place, and time. She appears well-developed and well-nourished. No distress.  HENT:  Head: Normocephalic and atraumatic.  Cardiovascular: Normal rate, regular rhythm and normal heart sounds.   No murmur heard. Pulmonary/Chest: Effort normal and breath sounds normal. She has no wheezes.  Abdominal: Soft. Bowel sounds are normal. She exhibits no mass. There is no tenderness.  No hepatosplenomegaly  Neurological: She is alert and oriented to person, place, and time. No cranial nerve deficit.  Skin: Skin is warm and dry.  Psychiatric: She has a normal mood and affect. Her behavior is normal.      Assessment & Plan:

## 2014-04-13 NOTE — Assessment & Plan Note (Signed)
80 year old white female with macrocytosis.  I doubt symptoms secondary to vitamin B12 deficiency. Unclear whether her macrocytosis secondary to chronic alcohol use versus hematologic malignancy. Refer to Dr. Marin Olp for further evaluation.

## 2014-04-13 NOTE — Progress Notes (Signed)
Pre visit review using our clinic review tool, if applicable. No additional management support is needed unless otherwise documented below in the visit note. 

## 2014-04-13 NOTE — Assessment & Plan Note (Signed)
Followed by Dr. Buddy Duty.  Continue monthly Boniva.

## 2014-04-14 ENCOUNTER — Ambulatory Visit: Payer: Medicare Other | Admitting: Internal Medicine

## 2014-04-16 ENCOUNTER — Telehealth: Payer: Self-pay

## 2014-04-16 ENCOUNTER — Other Ambulatory Visit: Payer: Self-pay | Admitting: Internal Medicine

## 2014-04-16 DIAGNOSIS — D7589 Other specified diseases of blood and blood-forming organs: Secondary | ICD-10-CM

## 2014-04-16 NOTE — Telephone Encounter (Signed)
Dr. Shawna Orleans wants patient to come in for labs. Called to inform patient no answer, left message on VM for patient to return call.

## 2014-04-17 NOTE — Telephone Encounter (Signed)
Left message for pt to call back  °

## 2014-04-20 ENCOUNTER — Other Ambulatory Visit (INDEPENDENT_AMBULATORY_CARE_PROVIDER_SITE_OTHER): Payer: Medicare Other

## 2014-04-20 DIAGNOSIS — D7589 Other specified diseases of blood and blood-forming organs: Secondary | ICD-10-CM

## 2014-04-20 LAB — VITAMIN B12: Vitamin B-12: 214 pg/mL (ref 211–911)

## 2014-04-23 LAB — METHYLMALONIC ACID, SERUM: METHYLMALONIC ACID, QUANT: 323 nmol/L — AB (ref 87–318)

## 2014-04-24 ENCOUNTER — Ambulatory Visit: Payer: Medicare Other | Admitting: Internal Medicine

## 2014-04-30 ENCOUNTER — Ambulatory Visit (INDEPENDENT_AMBULATORY_CARE_PROVIDER_SITE_OTHER): Payer: Medicare Other | Admitting: *Deleted

## 2014-04-30 ENCOUNTER — Telehealth: Payer: Self-pay | Admitting: Internal Medicine

## 2014-04-30 DIAGNOSIS — E538 Deficiency of other specified B group vitamins: Secondary | ICD-10-CM

## 2014-04-30 MED ORDER — CYANOCOBALAMIN 1000 MCG/ML IJ SOLN
1000.0000 ug | Freq: Once | INTRAMUSCULAR | Status: AC
  Administered 2014-04-30: 1000 ug via INTRAMUSCULAR

## 2014-04-30 NOTE — Telephone Encounter (Signed)
Pt would like results of labs done last week. °

## 2014-04-30 NOTE — Telephone Encounter (Signed)
See result note.  

## 2014-05-15 ENCOUNTER — Ambulatory Visit (INDEPENDENT_AMBULATORY_CARE_PROVIDER_SITE_OTHER): Payer: Medicare Other | Admitting: Internal Medicine

## 2014-05-15 ENCOUNTER — Encounter: Payer: Self-pay | Admitting: Internal Medicine

## 2014-05-15 VITALS — BP 160/70 | HR 64 | Temp 97.8°F | Wt 152.0 lb

## 2014-05-15 DIAGNOSIS — E538 Deficiency of other specified B group vitamins: Secondary | ICD-10-CM | POA: Insufficient documentation

## 2014-05-15 DIAGNOSIS — I10 Essential (primary) hypertension: Secondary | ICD-10-CM

## 2014-05-15 MED ORDER — AMLODIPINE BESYLATE 2.5 MG PO TABS: 2.5000 mg | ORAL_TABLET | Freq: Every day | ORAL | Status: DC

## 2014-05-15 MED ORDER — CYANOCOBALAMIN 250 MCG PO TABS: 250.0000 ug | ORAL_TABLET | Freq: Every day | ORAL | Status: DC

## 2014-05-15 NOTE — Progress Notes (Signed)
Subjective:    Patient ID: [name], female    DOB: 1923/01/29, 80 y.o.   MRN: [id number]  HPI  80 year old white female with history of mild hyperparathyroidism, hypertension and osteoporosis for routine followup. At previous ,we discussed lab abnormality-macrocytosis on routine CBC. 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  Patient complains of chronic mild fatigue. She also reports intermittent irritability.  She is awaiting hematology evaluation.  Followup blood testing notable for slightly depressed B12 level and elevated methylmalonic acid level. Patient started on vitamin B12 IM 1000 micrograms q. Monthly.   Review of Systems    She has chronic hx of Raynaud's in her hands bilaterally, her symptoms getting worse recently, Negative for paresthesias  Past Medical History  Diagnosis Date  . Anxiety   . Hypertension   . Glaucoma   . ANXIETY 12/31/2009  . CHEST PAIN 12/08/2004  . FATIGUE 01/09/2011  . GLUCOMA 12/31/2009  . HYPERTENSION 12/31/2009  . LEFT BUNDLE BRANCH BLOCK 01/03/2010  . MALIGNANT MELANOMA, HX OF 01/03/2010  . MITRAL VALVE PROLAPSE 12/31/2009  . Glaucoma     History   Social History  . Marital Status: Widowed    Spouse Name: N/A    Number of Children: N/A  . Years of Education: N/A   Occupational History  . Not on file.   Social History Main Topics  . Smoking status: Never Smoker   . Smokeless tobacco: Never Used  . Alcohol Use: Not on file  . Drug Use: Not on file  . Sexual Activity: Not on file   Other Topics Concern  . Not on file   Social History Narrative   Lives with spouse (2nd marriage in 2009, 1st widowed in 1979) husband-- Lorann Tani   Enjoys golf, not much aerobic activity--spends time in [place] part of the year.          Past Surgical History  Procedure Laterality Date  . Eye surgery  1960  . Tonsillectomy  1934  . Breast surgery  1950    surgery    Family History  Problem Relation Age of Onset  .  Heart disease Mother   . Kidney disease Father   . Heart attack Sister   . Cancer Brother     lung  . Hypertension Other   . Hyperlipidemia Other     Allergies  Allergen Reactions  . Codeine     Current Outpatient Prescriptions on File Prior to Visit  Medication Sig Dispense Refill  . Cholecalciferol (VITAMIN D) 2000 UNITS CAPS Take 1 capsule by mouth daily.        . dorzolamide-timolol (COSOPT) 22.3-6.8 MG/ML ophthalmic solution Place 1 drop into both eyes every 12 (twelve) hours.        . ibandronate (BONIVA) 150 MG tablet TAKE ONE TABLET THE SAME DAY OF THE MONTH WITH A FULL GLASS OF WATER ON AN EMPTY STOMACH. DON'T LIE DOWN OR EAT ANYTHING ELSE FOR 30 MINUTES  1 tablet  11  . ipratropium (ATROVENT) 0.06 % nasal spray USE 2 SPRAYS INTO EACH NOSTRIL ONCE DAILY AS NEEDED FOR RUNNY NOSE SYMPTOMS  15 mL  5  . potassium chloride (K-DUR) 10 MEQ tablet Take 1 tablet (10 mEq total) by mouth daily.  90 tablet  1  . travoprost, benzalkonium, (TRAVATAN) 0.004 % ophthalmic solution Place 1 drop into both eyes at bedtime.        . valsartan-hydrochlorothiazide (DIOVAN-HCT) 160-12.5 MG per tablet Take 1 tablet by mouth  daily.  90 tablet  1  . verapamil (CALAN-SR) 180 MG CR tablet TAKE ONE TABLET AT BEDTIME  90 tablet  1   No current facility-administered medications on file prior to visit.    BP 160/70  Pulse 64  Temp(Src) 97.8 F (36.6 C) (Oral)  Wt 152 lb (68.947 kg)       Objective:   Physical Exam  Constitutional: She is oriented to person, place, and time. She appears well-developed and well-nourished. No distress.  Cardiovascular: Normal rate, regular rhythm and normal heart sounds.   No murmur heard. Pulmonary/Chest: Effort normal and breath sounds normal.  Neurological: She is alert and oriented to person, place, and time. No cranial nerve deficit.  Skin: Skin is warm and dry.  Psychiatric: She has a normal mood and affect. Her behavior is normal.       Assessment &  Plan:

## 2014-05-15 NOTE — Assessment & Plan Note (Signed)
Patient reports home blood pressure readings elevated. Systolic blood pressure frequently in the 140s to 150s. Add amlodipine 2.5 mg to her current regimen. Hopefully amlodipine will help with her periodic Raynaud's exacerbation. BP: 160/70 mmHg

## 2014-05-15 NOTE — Assessment & Plan Note (Signed)
Patient found to have mild vitamin B12 deficiency. Unclear whether her worsening macrocytosis is secondary to vitamin B12 deficiency versus other hematologic process. Proceed with hematology evaluation. Switch to oral B12. Repeat B12 levels in 3 months.

## 2014-05-15 NOTE — Progress Notes (Signed)
Pre visit review using our clinic review tool, if applicable. No additional management support is needed unless otherwise documented below in the visit note. 

## 2014-05-15 NOTE — Patient Instructions (Addendum)
Please complete the following lab tests before your next follow up appointment: BMET - 401.9 Vitamin B12 level - 266.2

## 2014-06-01 ENCOUNTER — Ambulatory Visit: Payer: Medicare Other | Admitting: *Deleted

## 2014-06-02 ENCOUNTER — Ambulatory Visit: Payer: Medicare Other | Admitting: Hematology & Oncology

## 2014-06-02 ENCOUNTER — Telehealth: Payer: Self-pay | Admitting: Hematology & Oncology

## 2014-06-02 ENCOUNTER — Ambulatory Visit: Payer: Medicare Other

## 2014-06-02 ENCOUNTER — Other Ambulatory Visit: Payer: Medicare Other | Admitting: Lab

## 2014-06-02 NOTE — Telephone Encounter (Signed)
I tried to call NEW PATIENT today to remind them of their appointment with Dr. Marin Olp. Also, advised them to bring all medication bottles and insurance card information.  However, phone just rings w no vm attached.

## 2014-06-03 ENCOUNTER — Ambulatory Visit: Payer: Medicare Other

## 2014-06-03 ENCOUNTER — Other Ambulatory Visit (HOSPITAL_BASED_OUTPATIENT_CLINIC_OR_DEPARTMENT_OTHER): Payer: Medicare Other | Admitting: Lab

## 2014-06-03 ENCOUNTER — Encounter: Payer: Self-pay | Admitting: Hematology & Oncology

## 2014-06-03 ENCOUNTER — Ambulatory Visit (HOSPITAL_BASED_OUTPATIENT_CLINIC_OR_DEPARTMENT_OTHER): Payer: Medicare Other | Admitting: Hematology & Oncology

## 2014-06-03 VITALS — BP 158/63 | HR 71 | Temp 98.2°F | Resp 14 | Ht 66.0 in | Wt 151.0 lb

## 2014-06-03 DIAGNOSIS — D7589 Other specified diseases of blood and blood-forming organs: Secondary | ICD-10-CM

## 2014-06-03 DIAGNOSIS — D649 Anemia, unspecified: Secondary | ICD-10-CM

## 2014-06-03 DIAGNOSIS — R799 Abnormal finding of blood chemistry, unspecified: Secondary | ICD-10-CM

## 2014-06-03 DIAGNOSIS — L989 Disorder of the skin and subcutaneous tissue, unspecified: Secondary | ICD-10-CM

## 2014-06-03 DIAGNOSIS — Z85820 Personal history of malignant melanoma of skin: Secondary | ICD-10-CM

## 2014-06-03 LAB — CBC WITH DIFFERENTIAL (CANCER CENTER ONLY)
BASO#: 0 10*3/uL (ref 0.0–0.2)
BASO%: 0.3 % (ref 0.0–2.0)
EOS%: 2 % (ref 0.0–7.0)
Eosinophils Absolute: 0.2 10*3/uL (ref 0.0–0.5)
HCT: 41.1 % (ref 34.8–46.6)
HGB: 14 g/dL (ref 11.6–15.9)
LYMPH#: 1.3 10*3/uL (ref 0.9–3.3)
LYMPH%: 17.1 % (ref 14.0–48.0)
MCH: 32.5 pg (ref 26.0–34.0)
MCHC: 34.1 g/dL (ref 32.0–36.0)
MCV: 95 fL (ref 81–101)
MONO#: 0.9 10*3/uL (ref 0.1–0.9)
MONO%: 11.3 % (ref 0.0–13.0)
NEUT#: 5.2 10*3/uL (ref 1.5–6.5)
NEUT%: 69.3 % (ref 39.6–80.0)
Platelets: 190 10*3/uL (ref 145–400)
RBC: 4.31 10*6/uL (ref 3.70–5.32)
RDW: 14.1 % (ref 11.1–15.7)
WBC: 7.5 10*3/uL (ref 3.9–10.0)

## 2014-06-03 LAB — CMP (CANCER CENTER ONLY)
ALBUMIN: 4 g/dL (ref 3.3–5.5)
ALT(SGPT): 18 U/L (ref 10–47)
AST: 21 U/L (ref 11–38)
Alkaline Phosphatase: 57 U/L (ref 26–84)
BUN: 14 mg/dL (ref 7–22)
CO2: 28 mEq/L (ref 18–33)
Calcium: 9.8 mg/dL (ref 8.0–10.3)
Chloride: 96 mEq/L — ABNORMAL LOW (ref 98–108)
Creat: 1 mg/dl (ref 0.6–1.2)
Glucose, Bld: 99 mg/dL (ref 73–118)
POTASSIUM: 3.9 meq/L (ref 3.3–4.7)
SODIUM: 139 meq/L (ref 128–145)
Total Bilirubin: 0.7 mg/dl (ref 0.20–1.60)
Total Protein: 8 g/dL (ref 6.4–8.1)

## 2014-06-03 LAB — CHCC SATELLITE - SMEAR

## 2014-06-03 NOTE — Progress Notes (Signed)
Hematology/Oncology Consultation   Name: [name]      MRN: [id number]    Location: Room/bed info not found  Date: 06/03/2014 Time:3:40 PM   REFERRING PHYSICIAN:  Dr. Alyson Ingles  REASON FOR CONSULT:  Macrocytosis without anemia  DIAGNOSIS: macrocytosis  HISTORY OF PRESENT ILLNESS: [name] is a very pleasant 80 yo white female here today for assessment and treatment of macrocytic anemia. She has referred here after routine blood work ordered by her PCP came back suggestive of macrocytosis. She is asymptomatic at this time. She states that she has had not symptoms. She states that her energy level is good. She denies fatigue, fever, chills, cough, rash, headache, dizziness, blurred vision, chest, pain, palpitations, SOB, constipation, diarrhea, problems urinating, blood in urine. She states that she has Raynaud's syndrome and can't take the cold but denies numbness, tingling or swelling in her extremities. She states that she had a melanoma removed from her left shoulder 15 years ago and sees her dermatologist, Dr. Jarome Matin, every 3 months.   ROS: All other 10 point review of systems is negative.  PAST MEDICAL HISTORY:   Past Medical History  Diagnosis Date  . Anxiety   . Hypertension   . Glaucoma   . ANXIETY 12/31/2009  . CHEST PAIN 12/08/2004  . FATIGUE 01/09/2011  . GLUCOMA 12/31/2009  . HYPERTENSION 12/31/2009  . LEFT BUNDLE BRANCH BLOCK 01/03/2010  . MALIGNANT MELANOMA, HX OF 01/03/2010  . MITRAL VALVE PROLAPSE 12/31/2009  . Glaucoma    ALLERGIES: Allergies  Allergen Reactions  . Codeine    MEDICATIONS:  Current Outpatient Prescriptions on File Prior to Visit  Medication Sig Dispense Refill  . amLODipine (NORVASC) 2.5 MG tablet Take 1 tablet (2.5 mg total) by mouth daily.  90 tablet  1  . Cholecalciferol (VITAMIN D) 2000 UNITS CAPS Take 1 capsule by mouth daily.        . dorzolamide-timolol (COSOPT) 22.3-6.8 MG/ML ophthalmic solution Place 1 drop into both eyes every 12  (twelve) hours.        . ibandronate (BONIVA) 150 MG tablet TAKE ONE TABLET THE SAME DAY OF THE MONTH WITH A FULL GLASS OF WATER ON AN EMPTY STOMACH. DON'T LIE DOWN OR EAT ANYTHING ELSE FOR 30 MINUTES  1 tablet  11  . ipratropium (ATROVENT) 0.06 % nasal spray USE 2 SPRAYS INTO EACH NOSTRIL ONCE DAILY AS NEEDED FOR RUNNY NOSE SYMPTOMS  15 mL  5  . potassium chloride (K-DUR) 10 MEQ tablet Take 1 tablet (10 mEq total) by mouth daily.  90 tablet  1  . travoprost, benzalkonium, (TRAVATAN) 0.004 % ophthalmic solution Place 1 drop into both eyes at bedtime.        . valsartan-hydrochlorothiazide (DIOVAN-HCT) 160-12.5 MG per tablet Take 1 tablet by mouth daily.  90 tablet  1  . verapamil (CALAN-SR) 180 MG CR tablet TAKE ONE TABLET AT BEDTIME  90 tablet  1  . vitamin B-12 (CYANOCOBALAMIN) 250 MCG tablet Take 1 tablet (250 mcg total) by mouth daily.  90 tablet  1   No current facility-administered medications on file prior to visit.   PAST SURGICAL HISTORY Past Surgical History  Procedure Laterality Date  . Eye surgery  1960  . Tonsillectomy  1934  . Breast surgery  1950    surgery   FAMILY HISTORY: Family History  Problem Relation Age of Onset  . Heart disease Mother   . Kidney disease Father   . Heart attack Sister   .  Cancer Brother     lung  . Hypertension Other   . Hyperlipidemia Other    SOCIAL HISTORY:  reports that she has never smoked. She has never used smokeless tobacco. Her alcohol and drug histories are not on file.  PERFORMANCE STATUS: The patient's performance status is 0 - Asymptomatic  PHYSICAL EXAM: Most Recent Vital Signs: Blood pressure 158/63, pulse 71, temperature 98.2 F (36.8 C), temperature source Oral, resp. rate 14, height 5\' 6"  (1.676 m), weight 151 lb (68.493 kg). BP 158/63  Pulse 71  Temp(Src) 98.2 F (36.8 C) (Oral)  Resp 14  Ht 5\' 6"  (1.676 m)  Wt 151 lb (68.493 kg)  BMI 24.38 kg/m2  General Appearance:    Alert, cooperative, no distress, appears  stated age  Head:    Normocephalic, without obvious abnormality, atraumatic  Eyes:    PERRL, conjunctiva/corneas clear, EOM's intact, fundi    benign, both eyes  Ears:    Normal TM's and external ear canals, both ears  Nose:   Nares normal, septum midline, mucosa normal, no drainage    or sinus tenderness  Throat:   Lips, mucosa, and tongue normal; teeth and gums normal  Neck:   Supple, symmetrical, trachea midline, no adenopathy;    thyroid:  no enlargement/tenderness/nodules; no carotid   bruit or JVD  Back:     Symmetric, no curvature, ROM normal, no CVA tenderness  Lungs:     Clear to auscultation bilaterally, respirations unlabored  Chest Wall:    No tenderness or deformity   Heart:    Regular rate and rhythm, S1 and S2 normal, no murmur, rub   or gallop     Abdomen:     Soft, non-tender, bowel sounds active all four quadrants,    no masses, no organomegaly        Extremities:   Extremities normal, atraumatic, no cyanosis or edema  Pulses:   2+ and symmetric all extremities  Skin:   Skin color, texture, turgor normal, no rashes or lesions  Lymph nodes:   Cervical, supraclavicular, and axillary nodes normal  Neurologic:   CNII-XII intact, normal strength, sensation and reflexes    throughout   LABORATORY DATA:  Results for orders placed in visit on 06/03/14 (from the past 48 hour(s))  CBC WITH DIFFERENTIAL ([facility])     Status: None   Collection Time    06/03/14  1:34 PM      Result Value Ref Range   WBC 7.5  3.9 - 10.0 10e3/uL   RBC 4.31  3.70 - 5.32 10e6/uL   HGB 14.0  11.6 - 15.9 g/dL   HCT 41.1  34.8 - 46.6 %   MCV 95  81 - 101 fL   MCH 32.5  26.0 - 34.0 pg   MCHC 34.1  32.0 - 36.0 g/dL   RDW 14.1  11.1 - 15.7 %   Platelets 190  145 - 400 10e3/uL   NEUT# 5.2  1.5 - 6.5 10e3/uL   LYMPH# 1.3  0.9 - 3.3 10e3/uL   MONO# 0.9  0.1 - 0.9 10e3/uL   Eosinophils Absolute 0.2  0.0 - 0.5 10e3/uL   BASO# 0.0  0.0 - 0.2 10e3/uL   NEUT% 69.3  39.6 - 80.0 %   LYMPH% 17.1   14.0 - 48.0 %   MONO% 11.3  0.0 - 13.0 %   EOS% 2.0  0.0 - 7.0 %   BASO% 0.3  0.0 - 2.0 %  [facility] -  SMEAR     Status: None   Collection Time    06/03/14  1:34 PM      Result Value Ref Range   Smear Result Smear Available    COMPREHENSIVE METABOLIC PANEL ([facility] ONLY)     Status: Abnormal   Collection Time    06/03/14  1:36 PM      Result Value Ref Range   Sodium 139  128 - 145 mEq/L   Potassium 3.9  3.3 - 4.7 mEq/L   Chloride 96 (*) 98 - 108 mEq/L   CO2 28  18 - 33 mEq/L   Glucose, Bld 99  73 - 118 mg/dL   BUN, Bld 14  7 - 22 mg/dL   Creat 1.0  0.6 - 1.2 mg/dl   Total Bilirubin 0.70  0.20 - 1.60 mg/dl   Alkaline Phosphatase 57  26 - 84 U/L   AST 21  11 - 38 U/L   ALT(SGPT) 18  10 - 47 U/L   Total Protein 8.0  6.4 - 8.1 g/dL   Albumin 4.0  3.3 - 5.5 g/dL   Calcium 9.8  8.0 - 10.3 mg/dL     RADIOGRAPHY: No results found.     PATHOLOGY: none  ASSESSMENT/PLAN: Mrs. Cottam is a very pleasant 80 yo while female here referred to [place] with macrocytosis. She is completely asymptomatic. After reviewing her labs with Dr. Marin Olp and discussing them it was evident that she does not have macrocytosis.   This was also discussed with the patient and she is in agreement.   We will not need to schedule a follow-up appointment with her.   She was released back to her PCP and can return at any time with any issues.   All questions were answered. The patient knows to call the clinic with any problems, questions or concerns. We can certainly see the patient again if necessary.  The patient was also seen by and plan discussed with Dr. Marin Olp and he is in agreement with the aforementioned.   [facility]  ADDENDUM:  I saw and examined the patient. I looked at her blood smear. She is not anemic. After processing the blood, she had no macrocytic index. Her blood smear did not show any evidence of a myelodysplastic process. She good menstruation for red blood cells. There were  no nucleated red cells. White cells per normal in morphology maturation.  Of note, she does have a positive Coombs assay. This could certainly suggest a cold agglutinin process. However, she does not have a elevated reticulocyte count.  She has this diagnosis of vitamin B12 deficiency. We checked her B12 level and it was normal at 621.  The only condition that I can think of that might explain this macrocytic  process could be cold agglutinin disease. We did prepare her sample in a manner that we want the test tubes. However, under the microscope, I did not see any blood cell clumping.  She is asymptomatic with this. Her physical exam is unremarkable. There is no palpable liver or spleen. I see no joint issues. I see no lymphadenopathy. She does have quite a few skin lesions from sun exposure. She has had melanoma in the past.  I just don't feel we have to see her back in the office. Again, I don't find any hematologic issue that we have to work with.   We will be more happy to see her back in the office in the future if any problems arise.  We spent a good hour with her and her husband. We do take care of her husband who has anemia.

## 2014-06-04 LAB — IRON AND TIBC CHCC
%SAT: 41 % (ref 21–57)
Iron: 122 ug/dL (ref 41–142)
TIBC: 298 ug/dL (ref 236–444)
UIBC: 175 ug/dL (ref 120–384)

## 2014-06-04 LAB — FERRITIN CHCC: FERRITIN: 205 ng/mL (ref 9–269)

## 2014-06-09 LAB — COLD AGGLUTININ TITER: Cold Agglutinin Titer: 1:20480 {titer} — AB

## 2014-06-09 LAB — RETICULOCYTES (CHCC)
ABS Retic: 49.7 10*3/uL (ref 19.0–186.0)
RBC.: 4.97 MIL/uL (ref 3.87–5.11)
Retic Ct Pct: 1 % (ref 0.4–2.3)

## 2014-06-09 LAB — HEMOGLOBINOPATHY EVALUATION

## 2014-06-09 LAB — VITAMIN B12: Vitamin B-12: 621 pg/mL (ref 211–911)

## 2014-06-09 LAB — FOLATE RBC: RBC Folate: 500 ng/mL (ref >280)

## 2014-06-09 LAB — DIRECT ANTIGLOBULIN TEST (NOT AT ARMC)
DAT (COMPLEMENT): POSITIVE — AB
DAT IGG: POSITIVE — AB

## 2014-06-09 LAB — ERYTHROPOIETIN: Erythropoietin: 9.4 m[IU]/mL (ref 2.6–18.5)

## 2014-06-09 LAB — HAPTOGLOBIN: Haptoglobin: 105 mg/dL (ref 45–215)

## 2014-06-17 ENCOUNTER — Ambulatory Visit: Payer: Medicare Other | Admitting: Internal Medicine

## 2014-07-31 ENCOUNTER — Ambulatory Visit: Payer: Medicare Other | Admitting: Internal Medicine

## 2014-08-06 ENCOUNTER — Other Ambulatory Visit (INDEPENDENT_AMBULATORY_CARE_PROVIDER_SITE_OTHER): Payer: Medicare Other

## 2014-08-06 DIAGNOSIS — E538 Deficiency of other specified B group vitamins: Secondary | ICD-10-CM

## 2014-08-06 DIAGNOSIS — I10 Essential (primary) hypertension: Secondary | ICD-10-CM

## 2014-08-06 LAB — VITAMIN B12: Vitamin B-12: 530 pg/mL (ref 211–911)

## 2014-08-06 LAB — BASIC METABOLIC PANEL
BUN: 16 mg/dL (ref 6–23)
CO2: 30 mEq/L (ref 19–32)
CREATININE: 0.8 mg/dL (ref 0.4–1.2)
Calcium: 10.6 mg/dL — ABNORMAL HIGH (ref 8.4–10.5)
Chloride: 100 mEq/L (ref 96–112)
GFR: 72.85 mL/min (ref >60.00)
Glucose, Bld: 87 mg/dL (ref 70–99)
Potassium: 3.5 mEq/L (ref 3.5–5.1)
Sodium: 136 mEq/L (ref 135–145)

## 2014-08-14 ENCOUNTER — Ambulatory Visit: Payer: Medicare Other | Admitting: Internal Medicine

## 2014-08-25 ENCOUNTER — Ambulatory Visit: Payer: Medicare Other | Admitting: Internal Medicine

## 2014-08-26 ENCOUNTER — Ambulatory Visit (INDEPENDENT_AMBULATORY_CARE_PROVIDER_SITE_OTHER): Payer: Medicare Other

## 2014-08-26 DIAGNOSIS — Z23 Encounter for immunization: Secondary | ICD-10-CM

## 2014-09-04 ENCOUNTER — Other Ambulatory Visit: Payer: Self-pay | Admitting: Internal Medicine

## 2014-09-30 ENCOUNTER — Encounter: Payer: Self-pay | Admitting: Internal Medicine

## 2014-09-30 ENCOUNTER — Ambulatory Visit (INDEPENDENT_AMBULATORY_CARE_PROVIDER_SITE_OTHER): Payer: Medicare Other | Admitting: Internal Medicine

## 2014-09-30 VITALS — BP 156/82 | HR 73 | Temp 97.8°F | Ht 66.0 in | Wt 157.0 lb

## 2014-09-30 DIAGNOSIS — F411 Generalized anxiety disorder: Secondary | ICD-10-CM

## 2014-09-30 DIAGNOSIS — D7589 Other specified diseases of blood and blood-forming organs: Secondary | ICD-10-CM

## 2014-09-30 DIAGNOSIS — I10 Essential (primary) hypertension: Secondary | ICD-10-CM

## 2014-09-30 MED ORDER — HYDRALAZINE HCL 25 MG PO TABS: 25.0000 mg | ORAL_TABLET | Freq: Two times a day (BID) | ORAL | Status: DC

## 2014-09-30 MED ORDER — VALSARTAN-HYDROCHLOROTHIAZIDE 160-12.5 MG PO TABS: 1.0000 | ORAL_TABLET | Freq: Every day | ORAL | Status: DC

## 2014-09-30 MED ORDER — VERAPAMIL HCL ER 240 MG PO TBCR
EXTENDED_RELEASE_TABLET | ORAL | Status: DC
Start: 2014-09-30 — End: 2014-12-23

## 2014-09-30 NOTE — Assessment & Plan Note (Signed)
Patient experiencing exacerbation due to family/ life stressors. Consider start low-dose SSRI.

## 2014-09-30 NOTE — Progress Notes (Signed)
Subjective:    Patient ID: [name], female    DOB: 19-Aug-1923, 80 y.o.   MRN: [id number]  HPI  This 80 year old white female with history of hypertension, diabetes and hyperparathyroidism for routine follow-up.  Hypertension-patient monitors her blood pressures at home. Over the last couple of days her systolic blood pressure has been significantly elevated in the 170s. Patient denies any change in medications. She reports good medication compliance. She denies using any over-the-counter supplement or "cold" preps.  Patient does report she has been under significant life stressors involving her daughter.  She drinks 1 glass of wine per night. She denies any significant change in alcohol consumption. She consumes 1 cup of coffee in the morning.  She was previously prescribed amlodipine 2.5 mg which she discontinued due to secondary to worsening lower extremity edema.  Review of Systems Negative for chest pain, visual changes or headache  Past Medical History  Diagnosis Date  . Anxiety   . Hypertension   . Glaucoma   . ANXIETY 12/31/2009  . CHEST PAIN 12/08/2004  . FATIGUE 01/09/2011  . GLUCOMA 12/31/2009  . HYPERTENSION 12/31/2009  . LEFT BUNDLE BRANCH BLOCK 01/03/2010  . MALIGNANT MELANOMA, HX OF 01/03/2010  . MITRAL VALVE PROLAPSE 12/31/2009  . Glaucoma     History   Social History  . Marital Status: Widowed    Spouse Name: N/A    Number of Children: N/A  . Years of Education: N/A   Occupational History  . Not on file.   Social History Main Topics  . Smoking status: Never Smoker   . Smokeless tobacco: Never Used     Comment: never used tobacco   . Alcohol Use: Not on file  . Drug Use: Not on file  . Sexual Activity: Not on file   Other Topics Concern  . Not on file   Social History Narrative   Lives with spouse (2nd marriage in 2009, 1st widowed in 1979) husband-- Laelynn Blizzard   Enjoys golf, not much aerobic activity--spends time in [place] part of the year.            Past Surgical History  Procedure Laterality Date  . Eye surgery  1960  . Tonsillectomy  1934  . Breast surgery  1950    surgery    Family History  Problem Relation Age of Onset  . Heart disease Mother   . Kidney disease Father   . Heart attack Sister   . Cancer Brother     lung  . Hypertension Other   . Hyperlipidemia Other     Allergies  Allergen Reactions  . Codeine     Current Outpatient Prescriptions on File Prior to Visit  Medication Sig Dispense Refill  . Cholecalciferol (VITAMIN D) 2000 UNITS CAPS Take 1 capsule by mouth daily.        . dorzolamide-timolol (COSOPT) 22.3-6.8 MG/ML ophthalmic solution Place 1 drop into both eyes every 12 (twelve) hours.        . ibandronate (BONIVA) 150 MG tablet TAKE ONE TABLET THE SAME DAY OF THE MONTH WITH A FULL GLASS OF WATER ON AN EMPTY STOMACH. DON'T LIE DOWN OR EAT ANYTHING ELSE FOR 30 MINUTES  1 tablet  11  . ipratropium (ATROVENT) 0.06 % nasal spray USE 2 SPRAYS INTO EACH NOSTRIL ONCE DAILY AS NEEDED FOR RUNNY NOSE SYMPTOMS  15 mL  5  . potassium chloride (K-DUR) 10 MEQ tablet Take 1 tablet (10 mEq total) by mouth daily.  [place]  tablet  1  . travoprost, benzalkonium, (TRAVATAN) 0.004 % ophthalmic solution Place 1 drop into both eyes at bedtime.        . vitamin B-12 (CYANOCOBALAMIN) 250 MCG tablet Take 1 tablet (250 mcg total) by mouth daily.  90 tablet  1   No current facility-administered medications on file prior to visit.    BP 156/82  Pulse 73  Temp(Src) 97.8 F (36.6 C) (Oral)  Ht 5\' 6"  (1.676 m)  Wt 157 lb (71.215 kg)  BMI 25.35 kg/m2       Objective:   Physical Exam  Constitutional: She is oriented to person, place, and time. She appears well-developed and well-nourished. No distress.  HENT:  Head: Normocephalic and atraumatic.  Cardiovascular: Normal rate, regular rhythm and normal heart sounds.   No murmur heard. Pulmonary/Chest: Effort normal and breath sounds normal. She has no wheezes.   Abdominal: Soft.  Musculoskeletal:  Trace lower extremity edema bilaterally  Neurological: She is alert and oriented to person, place, and time. No cranial nerve deficit.  Psychiatric: She has a normal mood and affect. Her behavior is normal.          Assessment & Plan:

## 2014-09-30 NOTE — Assessment & Plan Note (Signed)
Patient evaluated by hematologist. Her peripheral smear was unremarkable. No further workup recommended.

## 2014-09-30 NOTE — Progress Notes (Signed)
Pre visit review using our clinic review tool, if applicable. No additional management support is needed unless otherwise documented below in the visit note. 

## 2014-09-30 NOTE — Patient Instructions (Signed)
Monitor BP at home as directed Return in 2 days for nurse BP check.

## 2014-09-30 NOTE — Assessment & Plan Note (Addendum)
Patient's blood pressure with manual cuff 178/100 and left and right arm. This blood pressure imaging also confirmed with her home automated blood pressure cuff. Patient advised to perform medication reconciliation with her pharmacist to ensure she is taking her antihypertensives correctly.  It is unclear whether her significant life stressors are contributing to sporadically high readings.  Patient advised to take single dose of amlodipine 2.5 mg tonight. If she continues to record elevated home blood pressure readings, patient to start hydralazine 25 mg twice daily. Increase verapamil SR dose to 240 mg once daily.    Reassess in 2 days  Addendum 10/03/2015 - Patient's home blood pressure readings improving. 585 systolic and low 92T diastolic.  We discussed potential that stress reaction / anxiety exam may be contributing to higher BPs.  Trial of low dose citalopram 5 mg at bedtime.

## 2014-10-02 MED ORDER — CITALOPRAM HYDROBROMIDE 10 MG PO TABS: 5.0000 mg | ORAL_TABLET | Freq: Every evening | ORAL | Status: DC

## 2014-10-02 NOTE — Addendum Note (Signed)
Addended by: Rosine Abe on: 10/02/2014 01:15 PM   Modules accepted: Orders

## 2014-10-06 ENCOUNTER — Ambulatory Visit: Payer: Medicare Other | Admitting: Internal Medicine

## 2014-10-16 ENCOUNTER — Telehealth: Payer: Self-pay | Admitting: Internal Medicine

## 2014-10-16 NOTE — Telephone Encounter (Signed)
Dr. Shawna Orleans, pt would like to speak to you, please call.

## 2014-10-16 NOTE — Telephone Encounter (Signed)
Spoke to pt, told her she had regular Tetanus last year and TDAP is not being covered by Medicare. Pt verbalized understanding and stated she would like to speak to Dr. Shawna Orleans she another question for him. Told pt he is out of the office till Monday. Pt verbalized understanding.

## 2014-10-16 NOTE — Telephone Encounter (Signed)
Pt called and would like a call back about the whooping cough . She said her grand daughter is having a baby and they were advised to have this done .

## 2014-10-19 NOTE — Telephone Encounter (Signed)
Patient advised to contact [facility] for pertussis vaccine.  She can also try her local pharmacy.  Pt expresses understanding.  Pt also reports she stopped taking citalopram.  "it made me feel crazy".  Pt to follow up in late December for BP follow up appt.

## 2014-12-02 ENCOUNTER — Other Ambulatory Visit: Payer: Self-pay | Admitting: Internal Medicine

## 2014-12-07 ENCOUNTER — Ambulatory Visit (INDEPENDENT_AMBULATORY_CARE_PROVIDER_SITE_OTHER): Payer: PPO | Admitting: Family Medicine

## 2014-12-07 ENCOUNTER — Encounter: Payer: Self-pay | Admitting: Family Medicine

## 2014-12-07 ENCOUNTER — Telehealth: Payer: Self-pay | Admitting: Internal Medicine

## 2014-12-07 VITALS — BP 150/76 | HR 86 | Temp 97.5°F | Ht 66.0 in | Wt 154.8 lb

## 2014-12-07 DIAGNOSIS — J069 Acute upper respiratory infection, unspecified: Secondary | ICD-10-CM

## 2014-12-07 MED ORDER — BENZONATATE 100 MG PO CAPS: 100.0000 mg | ORAL_CAPSULE | Freq: Two times a day (BID) | ORAL | Status: DC | PRN

## 2014-12-07 NOTE — Progress Notes (Signed)
HPI:  -started: 4 days ago -symptoms:nasal congestion, sore throat, cough -denies:fever, SOB, NVD, tooth pain -has tried: her husband's cough medication with codeine and this helped -sick contacts/travel/risks: denies flu exposure, tick exposure or or Ebola risks -she denies allergy to codeine and reports she is   ROS: See pertinent positives and negatives per HPI.  Past Medical History  Diagnosis Date  . Anxiety   . Hypertension   . Glaucoma   . ANXIETY 12/31/2009  . CHEST PAIN 12/08/2004  . FATIGUE 01/09/2011  . GLUCOMA 12/31/2009  . HYPERTENSION 12/31/2009  . LEFT BUNDLE BRANCH BLOCK 01/03/2010  . MALIGNANT MELANOMA, HX OF 01/03/2010  . MITRAL VALVE PROLAPSE 12/31/2009  . Glaucoma     Past Surgical History  Procedure Laterality Date  . Eye surgery  1960  . Tonsillectomy  1934  . Breast surgery  1950    surgery    Family History  Problem Relation Age of Onset  . Heart disease Mother   . Kidney disease Father   . Heart attack Sister   . Cancer Brother     lung  . Hypertension Other   . Hyperlipidemia Other     History   Social History  . Marital Status: Widowed    Spouse Name: N/A    Number of Children: N/A  . Years of Education: N/A   Social History Main Topics  . Smoking status: Never Smoker   . Smokeless tobacco: Never Used     Comment: never used tobacco   . Alcohol Use: None  . Drug Use: None  . Sexual Activity: None   Other Topics Concern  . None   Social History Narrative   Lives with spouse (2nd marriage in 2009, 1st widowed in 1979) husband-- Erminie Foulks   Enjoys golf, not much aerobic activity--spends time in [place] part of the year.          Current outpatient prescriptions: Cholecalciferol (VITAMIN D) 2000 UNITS CAPS, Take 1 capsule by mouth daily.  , Disp: , Rfl: ;  citalopram (CELEXA) 10 MG tablet, Take 0.5 tablets (5 mg total) by mouth every evening., Disp: 30 tablet, Rfl: 2;  dorzolamide-timolol (COSOPT) 22.3-6.8 MG/ML ophthalmic  solution, Place 1 drop into both eyes every 12 (twelve) hours.  , Disp: , Rfl:  ibandronate (BONIVA) 150 MG tablet, TAKE ONE TABLET THE SAME DAY OF THE MONTH WITH A FULL GLASS OF WATER ON AN EMPTY STOMACH. DON'T LIE DOWN OR EAT ANYTHING ELSE FOR 30 MINUTES, Disp: 1 tablet, Rfl: 11;  ipratropium (ATROVENT) 0.06 % nasal spray, USE 2 SPRAYS INTO EACH NOSTRIL ONCE DAILY AS NEEDED FOR RUNNY NOSE SYMPTOMS, Disp: 15 mL, Rfl: 5;  potassium chloride (K-DUR) 10 MEQ tablet, TAKE ONE TABLET BY MOUTH ONCE DAILY, Disp: 90 tablet, Rfl: 1 Probiotic Product (ALIGN) 4 MG CAPS, Take 1 capsule by mouth daily., Disp: , Rfl: ;  travoprost, benzalkonium, (TRAVATAN) 0.004 % ophthalmic solution, Place 1 drop into both eyes at bedtime.  , Disp: , Rfl: ;  valsartan-hydrochlorothiazide (DIOVAN-HCT) 160-12.5 MG per tablet, Take 1 tablet by mouth daily., Disp: 90 tablet, Rfl: 1;  verapamil (CALAN-SR) 240 MG CR tablet, TAKE ONE TABLET AT BEDTIME, Disp: 90 tablet, Rfl: 1 vitamin B-12 (CYANOCOBALAMIN) 250 MCG tablet, Take 1 tablet (250 mcg total) by mouth daily., Disp: 90 tablet, Rfl: 1;  benzonatate (TESSALON) 100 MG capsule, Take 1 capsule (100 mg total) by mouth 2 (two) times daily as needed for cough., Disp: 20 capsule, Rfl: 0  EXAM:  Filed Vitals:   12/07/14 1411  BP: 150/76  Pulse: 86  Temp: 97.5 F (36.4 C)    Body mass index is 25 kg/(m^2).  GENERAL: vitals reviewed and listed above, alert, oriented, appears well hydrated and in no acute distress  HEENT: atraumatic, conjunttiva clear, no obvious abnormalities on inspection of external nose and ears, normal appearance of ear canals and TMs, clear nasal congestion, mild post oropharyngeal erythema with PND, no tonsillar edema or exudate, no sinus TTP  NECK: no obvious masses on inspection  LUNGS: clear to auscultation bilaterally, no wheezes, rales or rhonchi, good air movement  CV: HRRR, no peripheral edema  MS: moves all extremities without noticeable  abnormality  PSYCH: pleasant and cooperative, no obvious depression or anxiety  ASSESSMENT AND PLAN:  Discussed the following assessment and plan:  Acute upper respiratory infection - Plan: benzonatate (TESSALON) 100 MG capsule  -given HPI and exam findings today, a serious infection or illness is unlikely. We discussed potential etiologies, with VURI being most likely, and advised supportive care and monitoring. We discussed treatment side effects, likely course, antibiotic misuse, transmission, and signs of developing a serious illness. -of course, we advised to return or notify a doctor immediately if symptoms worsen or persist or new concerns arise.    Patient Instructions  INSTRUCTIONS FOR UPPER RESPIRATORY INFECTION:  -plenty of rest and fluids  -nasal saline wash 2-3 times daily (use prepackaged nasal saline or bottled/distilled water if making your own)   -can use oxymetazoline nasal spray for drainage and nasal congestion - but do NOT use longer then 3-4 days  -can use tylenol or ibuprofen as directed for aches and sorethroat  -in the winter time, using a humidifier at night is helpful (please follow cleaning instructions)  -if you are taking a cough medication - use only as directed, may also try a teaspoon of honey to coat the throat and throat lozenges  -for sore throat, salt water gargles can help  -follow up if you have fevers, facial pain, tooth pain, difficulty breathing or are worsening or not getting better in 5-7 days      KIM, HANNAH R.

## 2014-12-07 NOTE — Telephone Encounter (Signed)
Provider will not be in the office today, pt will see Dr. Maudie Mercury as scheduled.

## 2014-12-07 NOTE — Telephone Encounter (Signed)
Pt scheduled to see Dr. Maudie Mercury this afternoon for cough.  However, pt asked if Dr. Shawna Orleans could possible work her in this week as she prefers to see him.

## 2014-12-07 NOTE — Progress Notes (Signed)
Pre visit review using our clinic review tool, if applicable. No additional management support is needed unless otherwise documented below in the visit note. 

## 2014-12-07 NOTE — Patient Instructions (Signed)
INSTRUCTIONS FOR UPPER RESPIRATORY INFECTION:  -plenty of rest and fluids  -nasal saline wash 2-3 times daily (use prepackaged nasal saline or bottled/distilled water if making your own)   -can use oxymetazoline nasal spray for drainage and nasal congestion - but do NOT use longer then 3-4 days  -can use tylenol or ibuprofen as directed for aches and sorethroat  -in the winter time, using a humidifier at night is helpful (please follow cleaning instructions)  -if you are taking a cough medication - use only as directed, may also try a teaspoon of honey to coat the throat and throat lozenges  -for sore throat, salt water gargles can help  -follow up if you have fevers, facial pain, tooth pain, difficulty breathing or are worsening or not getting better in 5-7 days

## 2014-12-23 ENCOUNTER — Ambulatory Visit (INDEPENDENT_AMBULATORY_CARE_PROVIDER_SITE_OTHER): Payer: PPO | Admitting: Internal Medicine

## 2014-12-23 ENCOUNTER — Encounter: Payer: Self-pay | Admitting: Internal Medicine

## 2014-12-23 VITALS — BP 156/70 | HR 80 | Temp 97.9°F | Ht 66.0 in | Wt 152.0 lb

## 2014-12-23 DIAGNOSIS — I10 Essential (primary) hypertension: Secondary | ICD-10-CM

## 2014-12-23 DIAGNOSIS — F411 Generalized anxiety disorder: Secondary | ICD-10-CM

## 2014-12-23 DIAGNOSIS — I7300 Raynaud's syndrome without gangrene: Secondary | ICD-10-CM

## 2014-12-23 MED ORDER — SERTRALINE HCL 25 MG PO TABS: ORAL_TABLET | ORAL | Status: DC

## 2014-12-23 MED ORDER — AMLODIPINE BESYLATE 2.5 MG PO TABS: 2.5000 mg | ORAL_TABLET | Freq: Every day | ORAL | Status: DC

## 2014-12-23 MED ORDER — VERAPAMIL HCL ER 120 MG PO TBCR: EXTENDED_RELEASE_TABLET | ORAL | Status: DC

## 2014-12-23 NOTE — Patient Instructions (Signed)
Monitor your blood pressure at home.  Call our office if there is significant change in your blood pressure

## 2014-12-23 NOTE — Assessment & Plan Note (Signed)
Patient could not tolerate citalopram 10 mg. Trial of sertraline. Patient to take 12.5 mg at bedtime for 7 days, then titrate to 25 mg. If patient unable to tolerate sertraline consider using low-dose alprazolam as needed.

## 2014-12-23 NOTE — Progress Notes (Signed)
Subjective:    Patient ID: [name], female    DOB: 04-19-1923, 81 y.o.   MRN: [id number]  HPI  81 year old white female with history of hypertension, anxiety, and hyperparathyroidism for follow-up.  Hypertension-patient has been monitoring her blood pressure at home. At previous visit we increased her verapamil CR dose to 240 mg.  Patient also taking valsartan hydrochlorothiazide 160/12.5 mg. Her home blood pressure readings are usually normal. The range between 701 and 779 systolic. She has infrequent elevated blood pressure readings greater than 390 (systolic). She denies any dizziness.  Patient has history of primary Raynaud's. Her symptoms much worse during winter months. Her daughter also has Raynaud's.  Anxiety - she could not tolerate Citalopram.  Review of Systems Negative for chest pain, or shortness.  Chronic mild fatigue    Past Medical History  Diagnosis Date  . Anxiety   . Hypertension   . Glaucoma 12/31/2009  . ANXIETY 12/31/2009  . CHEST PAIN 12/08/2004  . FATIGUE 01/09/2011  . HYPERTENSION 12/31/2009  . LEFT BUNDLE BRANCH BLOCK 01/03/2010  . MALIGNANT MELANOMA, HX OF 01/03/2010  . MITRAL VALVE PROLAPSE 12/31/2009  . Raynaud's phenomenon     History   Social History  . Marital Status: Married    Spouse Name: N/A    Number of Children: N/A  . Years of Education: N/A   Occupational History  . Not on file.   Social History Main Topics  . Smoking status: Never Smoker   . Smokeless tobacco: Never Used     Comment: never used tobacco   . Alcohol Use: Not on file  . Drug Use: Not on file  . Sexual Activity: Not on file   Other Topics Concern  . Not on file   Social History Narrative   Lives with spouse (2nd marriage in 2009, 1st widowed in 1979) husband-- Myya Meenach   Enjoys golf, not much aerobic activity--spends time in [place] part of the year.          Past Surgical History  Procedure Laterality Date  . Eye surgery  1960  . Tonsillectomy   1934  . Breast surgery  1950    surgery    Family History  Problem Relation Age of Onset  . Heart disease Mother   . Kidney disease Father   . Heart attack Sister   . Cancer Brother     lung  . Hypertension Other   . Hyperlipidemia Other     Allergies  Allergen Reactions  . Codeine     Current Outpatient Prescriptions on File Prior to Visit  Medication Sig Dispense Refill  . Cholecalciferol (VITAMIN D) 2000 UNITS CAPS Take 1 capsule by mouth daily.      . dorzolamide-timolol (COSOPT) 22.3-6.8 MG/ML ophthalmic solution Place 1 drop into both eyes every 12 (twelve) hours.      . ibandronate (BONIVA) 150 MG tablet TAKE ONE TABLET THE SAME DAY OF THE MONTH WITH A FULL GLASS OF WATER ON AN EMPTY STOMACH. DON'T LIE DOWN OR EAT ANYTHING ELSE FOR 30 MINUTES 1 tablet 11  . ipratropium (ATROVENT) 0.06 % nasal spray USE 2 SPRAYS INTO EACH NOSTRIL ONCE DAILY AS NEEDED FOR RUNNY NOSE SYMPTOMS 15 mL 5  . potassium chloride (K-DUR) 10 MEQ tablet TAKE ONE TABLET BY MOUTH ONCE DAILY 90 tablet 1  . Probiotic Product (ALIGN) 4 MG CAPS Take 1 capsule by mouth daily.    . travoprost, benzalkonium, (TRAVATAN) 0.004 % ophthalmic solution Place 1 drop  into both eyes at bedtime.      . valsartan-hydrochlorothiazide (DIOVAN-HCT) 160-12.5 MG per tablet Take 1 tablet by mouth daily. 90 tablet 1  . vitamin B-12 (CYANOCOBALAMIN) 250 MCG tablet Take 1 tablet (250 mcg total) by mouth daily. 90 tablet 1   No current facility-administered medications on file prior to visit.    BP 156/70 mmHg  Pulse 80  Temp(Src) 97.9 F (36.6 C) (Oral)  Ht 5\' 6"  (1.676 m)  Wt 152 lb (68.947 kg)  BMI 24.55 kg/m2      Objective:   Physical Exam  Constitutional: She is oriented to person, place, and time. She appears well-developed and well-nourished.  HENT:  Head: Normocephalic and atraumatic.  Cardiovascular: Normal rate, regular rhythm and normal heart sounds.   Pulmonary/Chest: Effort normal and breath sounds  normal. She has no wheezes.  Musculoskeletal:  Trace lower extremity edema bilaterally  Neurological: She is alert and oriented to person, place, and time. No cranial nerve deficit.  Skin: Skin is warm and dry. No rash noted. No pallor.  Psychiatric: She has a normal mood and affect. Her behavior is normal.          Assessment & Plan:

## 2014-12-23 NOTE — Assessment & Plan Note (Signed)
Restart amlodipine 2.5 mg.  Patient experienced issues with lower extremity edema with previous trial of amlodipine. Patient unable to tolerate calcium channel blocker, consider topical nitrates.

## 2014-12-23 NOTE — Progress Notes (Signed)
Pre visit review using our clinic review tool, if applicable. No additional management support is needed unless otherwise documented below in the visit note. 

## 2014-12-23 NOTE — Assessment & Plan Note (Signed)
Blood pressure control improved. Home readings reviewed.  However patient experiencing significant exacerbation of her primary Raynaud's. Reduce verapamil CR dose to 120 mg. Continue valsartan hydrochlorothiazide at 160/2.5 mg. Add amlodipine 2.5 mg. Patient advised to continue monitoring blood pressure at home. BP: (!) 156/70 mmHg

## 2014-12-29 ENCOUNTER — Telehealth: Payer: Self-pay | Admitting: Internal Medicine

## 2014-12-29 ENCOUNTER — Other Ambulatory Visit: Payer: Self-pay | Admitting: *Deleted

## 2014-12-29 NOTE — Telephone Encounter (Signed)
Pt would like to know what time of day she needs to take her  amLODipine (NORVASC) 2.5 MG tablet    and sertraline (ZOLOFT) 25 MG tablet

## 2014-12-30 MED ORDER — ALPRAZOLAM 0.25 MG PO TABS: ORAL_TABLET | ORAL | Status: DC

## 2014-12-30 NOTE — Telephone Encounter (Signed)
Pt aware, she said the sertraline makes her feel really groggy and she has been taking it at bedtime.  She wants to know if Dr Shawna Orleans will change it.  Please advise

## 2014-12-30 NOTE — Telephone Encounter (Signed)
Pt aware, rx called in to [facility].  She also stated that her BP went up to 023X systolic and it has never gotten that high.  She states she had not done anything strenuous she was resting at the time.  Told pt to keep a record of her BP and let me know if it gets high 150s.  She stated her BP was always high in the afternoon and she agreed to keep checking it.  Nothing further is needed at this time

## 2014-12-30 NOTE — Telephone Encounter (Signed)
I would take amlodipine in the morning and sertraline at bedtime

## 2014-12-30 NOTE — Telephone Encounter (Signed)
Stop sertraline.  I suggest she try rx for alprazolam 0.25 mg 1/2 to one tablet daily as needed  # 30.  RF x 1.  As stated, she should try taking 1/2 of alprazolam 0.25 mg when she is experiencing stressful situation or she feels anxious.  Avoid alprazolam with alcohol consumption.

## 2015-01-04 ENCOUNTER — Telehealth: Payer: Self-pay | Admitting: Internal Medicine

## 2015-01-04 NOTE — Telephone Encounter (Signed)
Pt calling to report she is unable to continue taking ALPRAZolam (XANAX) 0.25 MG tablet. Pt states the medication is causing her to have increased anxiety(??).  However, she states she use to take buspar years ago and that seem to help her better than the Xanax.  Wanting to know what Dr. Shawna Orleans thinks about this.

## 2015-01-04 NOTE — Telephone Encounter (Signed)
Please advise 

## 2015-01-04 NOTE — Telephone Encounter (Signed)
Ok to stop alprazolam.  What dose of buspar did she try in the past?

## 2015-01-05 ENCOUNTER — Encounter: Payer: Self-pay | Admitting: Internal Medicine

## 2015-01-05 NOTE — Telephone Encounter (Signed)
Pt returned call and states she took the 5mg  tablet.  Pls advise if this can be called in.

## 2015-01-06 MED ORDER — BUSPIRONE HCL 5 MG PO TABS: 5.0000 mg | ORAL_TABLET | Freq: Two times a day (BID) | ORAL | Status: DC

## 2015-01-06 NOTE — Telephone Encounter (Signed)
Per Dr Shawna Orleans ok to call in Buspar 5 mg bid #60/1 refill.  Rx sent in electronically

## 2015-01-11 ENCOUNTER — Other Ambulatory Visit (INDEPENDENT_AMBULATORY_CARE_PROVIDER_SITE_OTHER): Payer: PPO

## 2015-01-11 ENCOUNTER — Telehealth: Payer: Self-pay | Admitting: Internal Medicine

## 2015-01-11 ENCOUNTER — Encounter: Payer: Self-pay | Admitting: Internal Medicine

## 2015-01-11 ENCOUNTER — Other Ambulatory Visit: Payer: PPO

## 2015-01-11 DIAGNOSIS — I10 Essential (primary) hypertension: Secondary | ICD-10-CM

## 2015-01-11 DIAGNOSIS — E213 Hyperparathyroidism, unspecified: Secondary | ICD-10-CM

## 2015-01-11 DIAGNOSIS — F411 Generalized anxiety disorder: Secondary | ICD-10-CM

## 2015-01-11 LAB — T4, FREE: FREE T4: 1.14 ng/dL (ref 0.60–1.60)

## 2015-01-11 LAB — HEPATIC FUNCTION PANEL
ALT: 17 U/L (ref 0–35)
AST: 19 U/L (ref 0–37)
Albumin: 4.3 g/dL (ref 3.5–5.2)
Alkaline Phosphatase: 56 U/L (ref 39–117)
BILIRUBIN DIRECT: 0.1 mg/dL (ref 0.0–0.3)
BILIRUBIN TOTAL: 0.5 mg/dL (ref 0.2–1.2)
Total Protein: 7.8 g/dL (ref 6.0–8.3)

## 2015-01-11 LAB — BASIC METABOLIC PANEL
BUN: 19 mg/dL (ref 6–23)
CO2: 29 mEq/L (ref 19–32)
CREATININE: 0.95 mg/dL (ref 0.40–1.20)
Calcium: 11.3 mg/dL — ABNORMAL HIGH (ref 8.4–10.5)
Chloride: 95 mEq/L — ABNORMAL LOW (ref 96–112)
GFR: 58.82 mL/min — ABNORMAL LOW (ref >60.00)
GLUCOSE: 99 mg/dL (ref 70–99)
POTASSIUM: 3.7 meq/L (ref 3.5–5.1)
Sodium: 132 mEq/L — ABNORMAL LOW (ref 135–145)

## 2015-01-11 LAB — CBC WITH DIFFERENTIAL/PLATELET
BASOS PCT: 0 % (ref 0–1)
Basophils Absolute: 0 10*3/uL (ref 0.0–0.1)
Eosinophils Absolute: 0.1 10*3/uL (ref 0.0–0.7)
Eosinophils Relative: 2 % (ref 0–5)
HEMATOCRIT: 45.1 % (ref 36.0–46.0)
Hemoglobin: 15.1 g/dL — ABNORMAL HIGH (ref 12.0–15.0)
Lymphocytes Relative: 22 % (ref 12–46)
Lymphs Abs: 1.6 10*3/uL (ref 0.7–4.0)
MCH: 31.4 pg (ref 26.0–34.0)
MCHC: 33.5 g/dL (ref 30.0–36.0)
MCV: 93.8 fL (ref 78.0–100.0)
MONO ABS: 0.7 10*3/uL (ref 0.1–1.0)
MPV: 12 fL (ref 8.6–12.4)
Monocytes Relative: 10 % (ref 3–12)
NEUTROS ABS: 4.7 10*3/uL (ref 1.7–7.7)
Neutrophils Relative %: 66 % (ref 43–77)
Platelets: 205 10*3/uL (ref 150–400)
RBC: 4.81 MIL/uL (ref 3.87–5.11)
RDW: 13.7 % (ref 11.5–15.5)
WBC: 7.1 10*3/uL (ref 4.0–10.5)

## 2015-01-11 LAB — TSH: TSH: 2.49 u[IU]/mL (ref 0.35–4.50)

## 2015-01-11 LAB — T3, FREE: T3 FREE: 3 pg/mL (ref 2.3–4.2)

## 2015-01-11 NOTE — Telephone Encounter (Signed)
Patient states she need to talk to you about her BP and "other things".  She would not give me any more information.  She said it's urgent.

## 2015-01-11 NOTE — Telephone Encounter (Signed)
Contacted pt - her anxiety worse over last 2 days.  She questions whether other medical issue causing her anxiety.  She has hx of hyperparathyroidism.  It has been several months since blood tests completed.  Patient advised to stop Buspar and come in for blood work today.  She has follow up appt with Dr. Buddy Duty in March, 2016.

## 2015-01-13 ENCOUNTER — Other Ambulatory Visit: Payer: Self-pay

## 2015-01-13 ENCOUNTER — Encounter: Payer: Self-pay | Admitting: Internal Medicine

## 2015-01-13 LAB — PTH, INTACT AND CALCIUM
CALCIUM: 11.5 mg/dL — AB (ref 8.4–10.5)
PTH: 71 pg/mL — ABNORMAL HIGH (ref 14–64)

## 2015-01-15 LAB — METANEPHRINES, PLASMA
METANEPHRINE FREE: 78 pg/mL — AB (ref <=57)
NORMETANEPHRINE FREE: 112 pg/mL (ref <=148)
Total Metanephrines-Plasma: 190 pg/mL (ref <=205)

## 2015-01-19 ENCOUNTER — Other Ambulatory Visit (HOSPITAL_COMMUNITY): Payer: Self-pay | Admitting: Internal Medicine

## 2015-01-19 DIAGNOSIS — E210 Primary hyperparathyroidism: Secondary | ICD-10-CM

## 2015-01-20 ENCOUNTER — Encounter: Payer: Self-pay | Admitting: Internal Medicine

## 2015-01-20 ENCOUNTER — Ambulatory Visit (INDEPENDENT_AMBULATORY_CARE_PROVIDER_SITE_OTHER): Payer: PPO | Admitting: Internal Medicine

## 2015-01-20 VITALS — BP 152/82 | HR 91 | Temp 97.9°F | Ht 66.0 in | Wt 149.0 lb

## 2015-01-20 DIAGNOSIS — R7989 Other specified abnormal findings of blood chemistry: Secondary | ICD-10-CM | POA: Insufficient documentation

## 2015-01-20 DIAGNOSIS — E213 Hyperparathyroidism, unspecified: Secondary | ICD-10-CM

## 2015-01-20 DIAGNOSIS — R799 Abnormal finding of blood chemistry, unspecified: Secondary | ICD-10-CM

## 2015-01-20 DIAGNOSIS — R270 Ataxia, unspecified: Secondary | ICD-10-CM | POA: Insufficient documentation

## 2015-01-20 NOTE — Patient Instructions (Addendum)
Stop Alprazolam

## 2015-01-20 NOTE — Assessment & Plan Note (Addendum)
Patient screened for possible pheochromocytoma due to labile blood pressure readings. Screening serum metanephrines elevated. Clinical situation discussed with her endocrinologist. 24-hour urine for contains a metanephrines pending.  Consider switching verapamil to beta blocker (labetalol)

## 2015-01-20 NOTE — Assessment & Plan Note (Addendum)
81 year old white female with history of uncontrolled hypertension complains of severe gait instability over the last 2 weeks. Obtain MRI of brain without contrast to rule out cerebellar stroke.  Her symptoms may also be secondary to BZD use.  Discontinue alprazolam.

## 2015-01-20 NOTE — Progress Notes (Signed)
Subjective:    Patient ID: [name], female    DOB: [birth year], 81 y.o.   MRN: [id number]  HPI  81 year old white female previously seen for worsening anxiety symptoms with history of hyperparathyroidism for follow-up. Her recent blood work showed worsening elevation in serum calcium levels. She was seen by her endocrinologist. Her endocrinologist feels her psychiatric symptoms may be related to worsening hyperparathyroidism. Her hydrochlorothiazide was discontinued. Patient also scheduled for Reclast infusion.  Over the last 2 months patient has had labile blood pressure. She has had frequent elevations in her blood pressure readings. Screening serum metanephrines were elevated.  Awaiting 24 hr urine metanephrines and catecholamine.  Several medications were tried for anxiety symptoms. She could not tolerate SSRIs or BuSpar. Patient has been using low-dose alprazolam. She takes half tablet of 0.25 mg at bedtime. She occasionally wakes up with anxiety symptoms and takes tea other half of alprazolam.  Over the last 1-2 weeks patient has noticed severe gait instability. Patient reports she feels like she is leaning when she walks.   Review of Systems Negative for nausea, no vomiting.  She is not orthostatic    Past Medical History  Diagnosis Date  . Anxiety   . Hypertension   . Glaucoma 12/31/2009  . ANXIETY 12/31/2009  . CHEST PAIN 12/08/2004  . FATIGUE 01/09/2011  . HYPERTENSION 12/31/2009  . LEFT BUNDLE BRANCH BLOCK 01/03/2010  . MALIGNANT MELANOMA, HX OF 01/03/2010  . MITRAL VALVE PROLAPSE 12/31/2009  . Raynaud's phenomenon     History   Social History  . Marital Status: Married    Spouse Name: N/A  . Number of Children: N/A  . Years of Education: N/A   Occupational History  . Not on file.   Social History Main Topics  . Smoking status: Never Smoker   . Smokeless tobacco: Never Used     Comment: never used tobacco   . Alcohol Use: Not on file  . Drug Use: Not on file    . Sexual Activity: Not on file   Other Topics Concern  . Not on file   Social History Narrative   Lives with spouse (2nd marriage in 2009, 1st widowed in 1979) husband-- [name]   Enjoys golf, not much aerobic activity--spends time in [place] part of the year.          Past Surgical History  Procedure Laterality Date  . Eye surgery  1960  . Tonsillectomy  1934  . Breast surgery  1950    surgery    Family History  Problem Relation Age of Onset  . Heart disease Mother   . Kidney disease Father   . Heart attack Sister   . Cancer Brother     lung  . Hypertension Other   . Hyperlipidemia Other     Allergies  Allergen Reactions  . Codeine     Current Outpatient Prescriptions on File Prior to Visit  Medication Sig Dispense Refill  . ALPRAZolam (XANAX) 0.25 MG tablet 1/2-1 tablet by mouth qhs prn 30 tablet 1  . amLODipine (NORVASC) 2.5 MG tablet Take 1 tablet (2.5 mg total) by mouth daily. 90 tablet 1  . Cholecalciferol (VITAMIN D) 2000 UNITS CAPS Take 1 capsule by mouth daily.      . dorzolamide-timolol (COSOPT) 22.3-6.8 MG/ML ophthalmic solution Place 1 drop into both eyes every 12 (twelve) hours.      [name] Kitchen ipratropium (ATROVENT) 0.06 % nasal spray USE 2 SPRAYS INTO EACH NOSTRIL ONCE DAILY AS  NEEDED FOR RUNNY NOSE SYMPTOMS 15 mL 5  . potassium chloride (K-DUR) 10 MEQ tablet TAKE ONE TABLET BY MOUTH ONCE DAILY 90 tablet 1  . Probiotic Product (ALIGN) 4 MG CAPS Take 1 capsule by mouth daily.    . travoprost, benzalkonium, (TRAVATAN) 0.004 % ophthalmic solution Place 1 drop into both eyes at bedtime.      . valsartan (DIOVAN) 320 MG tablet Take 320 mg by mouth daily. Take once daily.    . verapamil (CALAN-SR) 120 MG CR tablet TAKE ONE TABLET AT BEDTIME 90 tablet 1  . vitamin B-12 (CYANOCOBALAMIN) 250 MCG tablet Take 1 tablet (250 mcg total) by mouth daily. 90 tablet 1  . zoledronic acid (RECLAST) 5 MG/100ML SOLN injection Inject 5 mg into the vein once.     No current  facility-administered medications on file prior to visit.    BP 152/82 mmHg  Pulse 91  Temp(Src) 97.9 F (36.6 C) (Oral)  Ht 5\' 6"  (1.676 m)  Wt 149 lb (67.586 kg)  BMI 24.06 kg/m2    Objective:   Physical Exam  Constitutional: She is oriented to person, place, and time. She appears well-developed and well-nourished.  HENT:  Head: Normocephalic and atraumatic.  Mouth/Throat: Oropharynx is clear and moist.  Eyes: EOM are normal. Pupils are equal, round, and reactive to light.  Neck: Neck supple.  Cardiovascular: Normal rate, regular rhythm and normal heart sounds.   No murmur heard. Pulmonary/Chest: Effort normal and breath sounds normal. She has no wheezes.  Musculoskeletal:  Trace lower extremity edema  Neurological: She is alert and oriented to person, place, and time. She displays normal reflexes. No cranial nerve deficit. She exhibits normal muscle tone.  Unsteady gait, positive Romberg No dysmetria  Skin: Skin is warm and dry.  Psychiatric: She has a normal mood and affect. Her behavior is normal.          Assessment & Plan:

## 2015-01-20 NOTE — Progress Notes (Signed)
Pre visit review using our clinic review tool, if applicable. No additional management support is needed unless otherwise documented below in the visit note. 

## 2015-01-20 NOTE — Assessment & Plan Note (Signed)
Patient seen by her endocrinologist-Dr. Buddy Duty.  Her calcium levels worse. Her hydrochlorothiazide discontinued and she is scheduled to receive ReClast infusion.  Parathyroid scan planned and referral to Dr Harlow Asa initiated.

## 2015-01-21 ENCOUNTER — Encounter: Payer: Self-pay | Admitting: Internal Medicine

## 2015-01-21 LAB — CREATININE, URINE, 24 HOUR
CREATININE, URINE: 46.4 mg/dL
Creatinine, 24H Ur: 1252 mg/d (ref 700–1800)

## 2015-01-22 MED ORDER — AMLODIPINE BESYLATE 5 MG PO TABS: 2.5000 mg | ORAL_TABLET | Freq: Every day | ORAL | Status: DC

## 2015-01-23 ENCOUNTER — Ambulatory Visit
Admission: RE | Admit: 2015-01-23 | Discharge: 2015-01-23 | Disposition: A | Discharge status: Home / Self Care | Payer: PPO | Source: Ambulatory Visit | Attending: Internal Medicine | Admitting: Internal Medicine

## 2015-01-23 DIAGNOSIS — R270 Ataxia, unspecified: Secondary | ICD-10-CM

## 2015-01-24 LAB — CATECHOLAMINES, FRACTIONATED, URINE, 24 HOUR
Calculated Total (E+NE): 37 mcg/24 h (ref 26–121)
Creatinine, Urine mg/day-CATEUR: 1.25 g/(24.h) (ref 0.63–2.50)
Dopamine, 24 hr Urine: 157 mcg/24 h (ref 52–480)
NOREPINEPHRINE, 24 HR UR: 37 ug/(24.h) (ref 15–100)
TOTAL VOLUME - CF 24HR U: 2700 mL

## 2015-01-24 LAB — METANEPHRINES, URINE, 24 HOUR
METANEPH TOTAL UR: 413 ug/(24.h) (ref 224–832)
Metanephrines, Ur: 170 mcg/24 h (ref 90–315)
NORMETANEPHRINE 24H UR: 243 ug/(24.h) (ref 122–676)

## 2015-01-25 ENCOUNTER — Encounter: Payer: Self-pay | Admitting: Internal Medicine

## 2015-01-26 ENCOUNTER — Encounter (HOSPITAL_COMMUNITY)
Admission: RE | Admit: 2015-01-26 | Discharge: 2015-01-26 | Disposition: A | Discharge status: Home / Self Care | Payer: PPO | Source: Ambulatory Visit | Attending: Internal Medicine | Admitting: Internal Medicine

## 2015-01-26 DIAGNOSIS — E210 Primary hyperparathyroidism: Secondary | ICD-10-CM | POA: Insufficient documentation

## 2015-01-26 MED ORDER — TECHNETIUM TC 99M SESTAMIBI - CARDIOLITE
26.4000 | Freq: Once | INTRAVENOUS | Status: AC | PRN
  Administered 2015-01-26: 26 via INTRAVENOUS

## 2015-01-29 ENCOUNTER — Encounter: Payer: Self-pay | Admitting: Internal Medicine

## 2015-01-29 ENCOUNTER — Other Ambulatory Visit: Payer: Self-pay | Admitting: Internal Medicine

## 2015-01-29 MED ORDER — MECLIZINE HCL 12.5 MG PO TABS: 12.5000 mg | ORAL_TABLET | Freq: Three times a day (TID) | ORAL | Status: DC | PRN

## 2015-01-30 ENCOUNTER — Other Ambulatory Visit: Payer: PPO

## 2015-01-31 ENCOUNTER — Other Ambulatory Visit: Payer: Self-pay

## 2015-01-31 ENCOUNTER — Emergency Department (HOSPITAL_COMMUNITY): Payer: PPO

## 2015-01-31 ENCOUNTER — Encounter (HOSPITAL_COMMUNITY): Payer: Self-pay | Admitting: Emergency Medicine

## 2015-01-31 ENCOUNTER — Emergency Department (HOSPITAL_COMMUNITY)
Admission: EM | Admit: 2015-01-31 | Discharge: 2015-01-31 | Disposition: A | Discharge status: Home / Self Care | Payer: PPO | Attending: Emergency Medicine | Admitting: Emergency Medicine

## 2015-01-31 DIAGNOSIS — Z8659 Personal history of other mental and behavioral disorders: Secondary | ICD-10-CM | POA: Diagnosis not present

## 2015-01-31 DIAGNOSIS — H409 Unspecified glaucoma: Secondary | ICD-10-CM | POA: Insufficient documentation

## 2015-01-31 DIAGNOSIS — I10 Essential (primary) hypertension: Secondary | ICD-10-CM | POA: Diagnosis not present

## 2015-01-31 DIAGNOSIS — R531 Weakness: Secondary | ICD-10-CM | POA: Diagnosis not present

## 2015-01-31 DIAGNOSIS — Z79899 Other long term (current) drug therapy: Secondary | ICD-10-CM | POA: Insufficient documentation

## 2015-01-31 DIAGNOSIS — R2681 Unsteadiness on feet: Secondary | ICD-10-CM

## 2015-01-31 DIAGNOSIS — Z85828 Personal history of other malignant neoplasm of skin: Secondary | ICD-10-CM | POA: Insufficient documentation

## 2015-01-31 DIAGNOSIS — R200 Anesthesia of skin: Secondary | ICD-10-CM | POA: Diagnosis present

## 2015-01-31 LAB — DIFFERENTIAL
BASOS ABS: 0 10*3/uL (ref 0.0–0.1)
Basophils Relative: 1 % (ref 0–1)
Eosinophils Absolute: 0.5 10*3/uL (ref 0.0–0.7)
Eosinophils Relative: 7 % — ABNORMAL HIGH (ref 0–5)
Lymphocytes Relative: 17 % (ref 12–46)
Lymphs Abs: 1.1 10*3/uL (ref 0.7–4.0)
MONOS PCT: 12 % (ref 3–12)
Monocytes Absolute: 0.8 10*3/uL (ref 0.1–1.0)
NEUTROS ABS: 4 10*3/uL (ref 1.7–7.7)
NEUTROS PCT: 63 % (ref 43–77)

## 2015-01-31 LAB — URINALYSIS, ROUTINE W REFLEX MICROSCOPIC
Bilirubin Urine: NEGATIVE
Glucose, UA: NEGATIVE mg/dL
HGB URINE DIPSTICK: NEGATIVE
Ketones, ur: 15 mg/dL — AB
LEUKOCYTES UA: NEGATIVE
NITRITE: NEGATIVE
Protein, ur: NEGATIVE mg/dL
Specific Gravity, Urine: 1.011 (ref 1.005–1.030)
Urobilinogen, UA: 1 mg/dL (ref 0.0–1.0)
pH: 7 (ref 5.0–8.0)

## 2015-01-31 LAB — COMPREHENSIVE METABOLIC PANEL
ALBUMIN: 3.6 g/dL (ref 3.5–5.2)
ALT: 16 U/L (ref 0–35)
ANION GAP: 7 (ref 5–15)
AST: 20 U/L (ref 0–37)
Alkaline Phosphatase: 56 U/L (ref 39–117)
BILIRUBIN TOTAL: 0.9 mg/dL (ref 0.3–1.2)
BUN: 10 mg/dL (ref 6–23)
CALCIUM: 10.1 mg/dL (ref 8.4–10.5)
CO2: 28 mmol/L (ref 19–32)
Chloride: 101 mmol/L (ref 96–112)
Creatinine, Ser: 0.79 mg/dL (ref 0.50–1.10)
GFR calc Af Amer: 83 mL/min — ABNORMAL LOW (ref >90)
GFR, EST NON AFRICAN AMERICAN: 72 mL/min — AB (ref >90)
GLUCOSE: 99 mg/dL (ref 70–99)
Potassium: 3.3 mmol/L — ABNORMAL LOW (ref 3.5–5.1)
Sodium: 136 mmol/L (ref 135–145)
Total Protein: 7.2 g/dL (ref 6.0–8.3)

## 2015-01-31 LAB — I-STAT CHEM 8, ED
BUN: 11 mg/dL (ref 6–23)
CREATININE: 0.8 mg/dL (ref 0.50–1.10)
Calcium, Ion: 1.33 mmol/L — ABNORMAL HIGH (ref 1.13–1.30)
Chloride: 101 mmol/L (ref 96–112)
GLUCOSE: 97 mg/dL (ref 70–99)
HCT: 39 % (ref 36.0–46.0)
HEMOGLOBIN: 13.3 g/dL (ref 12.0–15.0)
Potassium: 3.4 mmol/L — ABNORMAL LOW (ref 3.5–5.1)
Sodium: 137 mmol/L (ref 135–145)
TCO2: 23 mmol/L (ref 0–100)

## 2015-01-31 LAB — I-STAT TROPONIN, ED: TROPONIN I, POC: 0 ng/mL (ref 0.00–0.08)

## 2015-01-31 LAB — CBC
HEMATOCRIT: 38 % (ref 36.0–46.0)
HEMOGLOBIN: 13.7 g/dL (ref 12.0–15.0)
MCH: 34.7 pg — ABNORMAL HIGH (ref 26.0–34.0)
MCHC: 36.1 g/dL — ABNORMAL HIGH (ref 30.0–36.0)
MCV: 96.2 fL (ref 78.0–100.0)
PLATELETS: 197 10*3/uL (ref 150–400)
RBC: 3.95 MIL/uL (ref 3.87–5.11)
RDW: 14.1 % (ref 11.5–15.5)
WBC: 6.3 10*3/uL (ref 4.0–10.5)

## 2015-01-31 LAB — RAPID URINE DRUG SCREEN, HOSP PERFORMED
AMPHETAMINES: NOT DETECTED
Barbiturates: NOT DETECTED
Benzodiazepines: NOT DETECTED
Cocaine: NOT DETECTED
OPIATES: NOT DETECTED
TETRAHYDROCANNABINOL: NOT DETECTED

## 2015-01-31 LAB — PROTIME-INR
INR: 1.02 (ref 0.00–1.49)
PROTHROMBIN TIME: 13.5 s (ref 11.6–15.2)

## 2015-01-31 LAB — ETHANOL

## 2015-01-31 LAB — APTT: APTT: 31 s (ref 24–37)

## 2015-01-31 LAB — SEDIMENTATION RATE: SED RATE: 39 mm/h — AB (ref 0–22)

## 2015-01-31 MED ORDER — LORAZEPAM 2 MG/ML IJ SOLN
1.0000 mg | Freq: Once | INTRAMUSCULAR | Status: AC
  Administered 2015-01-31: 1 mg via INTRAVENOUS
  Filled 2015-01-31: qty 1

## 2015-01-31 NOTE — ED Provider Notes (Signed)
CSN: [id number]     Arrival date & time 01/31/15  1126 History   First MD Initiated Contact with Patient 01/31/15 1126     Chief Complaint  Patient presents with  . Numbness     (Consider location/radiation/quality/duration/timing/severity/associated sxs/prior Treatment) HPI Comments: Patient with a significant history for hypertension, left bundle branch block and mitral valve prolapse who presents today with 2-3 weeks of gradual worsening symptoms on the right side. She saw Dr. Shawna Orleans last week for symptoms and had an MRI done approximately 1 week ago that showed no acute findings. However since that time patient is having worsening symptoms. Over the last 4 days her daughter says every day she is getting worse and now she is unable to ambulate without a walker  Patient is a 81 y.o. female presenting with weakness. The history is provided by the patient and a relative.  Weakness This is a new problem. Episode onset: 2 weeks. The problem occurs constantly. The problem has been gradually worsening. Associated symptoms comments: Having difficulty walking and needing a walker now.  Feels heavy, numb and weak in the RLE, RUE.  Speech has slowed per daughter..    Past Medical History  Diagnosis Date  . Anxiety   . Hypertension   . Glaucoma 12/31/2009  . ANXIETY 12/31/2009  . CHEST PAIN 12/08/2004  . FATIGUE 01/09/2011  . HYPERTENSION 12/31/2009  . LEFT BUNDLE BRANCH BLOCK 01/03/2010  . MALIGNANT MELANOMA, HX OF 01/03/2010  . MITRAL VALVE PROLAPSE 12/31/2009  . Raynaud's phenomenon    Past Surgical History  Procedure Laterality Date  . Eye surgery  1960  . Tonsillectomy  1934  . Breast surgery  1950    surgery   Family History  Problem Relation Age of Onset  . Heart disease Mother   . Kidney disease Father   . Heart attack Sister   . Cancer Brother     lung  . Hypertension Other   . Hyperlipidemia Other    History  Substance Use Topics  . Smoking status: Never Smoker   . Smokeless  tobacco: Never Used     Comment: never used tobacco   . Alcohol Use: Not on file   OB History    No data available     Review of Systems  Neurological: Positive for weakness.  All other systems reviewed and are negative.     Allergies  Codeine  Home Medications   Prior to Admission medications   Medication Sig Start Date End Date Taking? Authorizing Provider  amLODipine (NORVASC) 5 MG tablet Take 0.5 tablets (2.5 mg total) by mouth daily. Patient taking differently: Take 2.5 mg by mouth 2 (two) times daily.  01/22/15  Yes Doe-Hyun Kyra Searles, DO  Cholecalciferol (VITAMIN D) 2000 UNITS CAPS Take 1 capsule by mouth daily.     Yes Historical Provider, MD  dorzolamide-timolol (COSOPT) 22.3-6.8 MG/ML ophthalmic solution Place 1 drop into both eyes every 12 (twelve) hours.     Yes Historical Provider, MD  ipratropium (ATROVENT) 0.06 % nasal spray USE 2 SPRAYS INTO EACH NOSTRIL ONCE DAILY AS NEEDED FOR RUNNY NOSE SYMPTOMS   Yes Doe-Hyun R Yoo, DO  meclizine (ANTIVERT) 12.5 MG tablet Take 1 tablet (12.5 mg total) by mouth 3 (three) times daily as needed for dizziness. 01/29/15  Yes Doe-Hyun Kyra Searles, DO  Probiotic Product (ALIGN) 4 MG CAPS Take 1 capsule by mouth daily.   Yes Historical Provider, MD  travoprost, benzalkonium, (TRAVATAN) 0.004 % ophthalmic solution Place 1  drop into both eyes at bedtime.     Yes Historical Provider, MD  valsartan (DIOVAN) 320 MG tablet Take 320 mg by mouth daily. Take once daily.   Yes Historical Provider, MD  verapamil (CALAN-SR) 120 MG CR tablet TAKE ONE TABLET AT BEDTIME 12/23/14  Yes Doe-Hyun R Yoo, DO  vitamin B-12 (CYANOCOBALAMIN) 250 MCG tablet Take 1 tablet (250 mcg total) by mouth daily. 05/15/14  Yes Doe-Hyun Kyra Searles, DO  zoledronic acid (RECLAST) 5 MG/100ML SOLN injection Inject 5 mg into the vein once.   Yes Historical Provider, MD  potassium chloride (K-DUR) 10 MEQ tablet TAKE ONE TABLET BY MOUTH ONCE DAILY Patient not taking: Reported on 01/31/2015 12/02/14    Doe-Hyun R Shawna Orleans, DO   BP 156/66 mmHg  Pulse 68  Temp(Src) 97.6 F (36.4 C) (Oral)  Resp 16  SpO2 100% Physical Exam  Constitutional: She is oriented to person, place, and time. She appears well-developed and well-nourished. No distress.  HENT:  Head: Normocephalic and atraumatic.  Mouth/Throat: Oropharynx is clear and moist.  Eyes: Conjunctivae and EOM are normal. Pupils are equal, round, and reactive to light.  Neck: Normal range of motion. Neck supple.  Cardiovascular: Normal rate, regular rhythm and intact distal pulses.   No murmur heard. Pulmonary/Chest: Effort normal and breath sounds normal. No respiratory distress. She has no wheezes. She has no rales.  Abdominal: Soft. She exhibits no distension. There is no tenderness. There is no rebound and no guarding.  Musculoskeletal: Normal range of motion. She exhibits no edema or tenderness.  Neurological: She is alert and oriented to person, place, and time. A cranial nerve deficit and sensory deficit is present.  4/5 strength in the right upper and lower ext.  Decreased sensation over the right face, upper and lower ext.  Mild right sided facial droop.  Right sided lateral visual field cut.  Normal finger to nose testing.  Skin: Skin is warm and dry. No rash noted. No erythema.  Psychiatric: She has a normal mood and affect. Her behavior is normal.  Nursing note and vitals reviewed.   ED Course  Procedures (including critical care time) Labs Review Labs Reviewed  CBC - Abnormal; Notable for the following:    MCH 34.7 (*)    MCHC 36.1 (*)    All other components within normal limits  DIFFERENTIAL - Abnormal; Notable for the following:    Eosinophils Relative 7 (*)    All other components within normal limits  COMPREHENSIVE METABOLIC PANEL - Abnormal; Notable for the following:    Potassium 3.3 (*)    GFR calc non Af Amer 72 (*)    GFR calc Af Amer 83 (*)    All other components within normal limits  URINALYSIS, ROUTINE W  REFLEX MICROSCOPIC - Abnormal; Notable for the following:    Ketones, ur 15 (*)    All other components within normal limits  I-STAT CHEM 8, ED - Abnormal; Notable for the following:    Potassium 3.4 (*)    Calcium, Ion 1.33 (*)    All other components within normal limits  PROTIME-INR  APTT  URINE RAPID DRUG SCREEN (HOSP PERFORMED)  ETHANOL  I-STAT TROPOININ, ED  Randolm Idol, ED    Imaging Review Mr Brain Wo Contrast  01/31/2015   CLINICAL DATA:  Right-sided numbness over the last month which has increased in the last week.  EXAM: MRI HEAD WITHOUT CONTRAST  TECHNIQUE: Multiplanar, multiecho pulse sequences of the brain and surrounding structures were  obtained without intravenous contrast.  COMPARISON:  MRI of the brain 01/23/2015 at [facility].  FINDINGS: No acute infarct, hemorrhage, or mass lesion is present. Moderate periventricular and subcortical white matter changes bilaterally are stable. The ventricles are proportionate to the degree of atrophy. No significant extraaxial fluid collection is present.  Flow is present in the major intracranial arteries. The patient is status post bilateral lens replacements. The globes and orbits are otherwise intact. The paranasal sinuses and mastoid air cells are clear.  Skullbase is unremarkable. Midline structures are within normal limits.  IMPRESSION: Or  1. No acute intracranial abnormality or significant interval change. 2. Stable atrophy and diffuse white matter disease. This likely reflects the sequelae of chronic microvascular ischemia.   Electronically Signed   By: San Morelle M.D.   On: 01/31/2015 14:14     EKG Interpretation   Date/Time:  Sunday January 31 2015 11:23:17 EST Ventricular Rate:  69 PR Interval:  201 QRS Duration: 128 QT Interval:  424 QTC Calculation: 952 R Axis:   -12 Text Interpretation:  Sinus rhythm new Left bundle branch block from  05/2005 Confirmed by [facility]  MD, Loree Fee ([id number]) on  01/31/2015 11:37:28 AM      MDM   Final diagnoses:  None    Patient with now 2-3 weeks of worsening right-sided deficits. On exam today she has right-sided visual field cut, decreased sensation and weakness over the right face, upper and lower extremity. Daughter states that she has become more immobile over the last 4 days and is now requiring a walker to get around. Patient was seen by PCP approximately one week ago after the symptoms have been ongoing for about 1 week and had an MRI done that showed chronic vascular disease without acute findings. Patient has had some blood pressure medication modification but no other medicine changes. She was noted to have what appears to be a benign lesion in her thyroid that she is seeing a Psychologist, sport and exercise for next week. She denies any infectious symptoms, neck or back pain.  Based on patient's exam concern for a stroke will discuss with neurology and stroke lab work pending. Patient's EKG showed a left bundle branch block however patient denies any chest pain or shortness of breath and last EKG done was over 6 years ago.  12:37 PM Discuss patient with Dr. Doy Mince who requested a second MRI.  3:31 PM Repeat MRI without acute findings.  Labs without acute findings.  Discussed pt with Dr. Doy Mince who will come evaluate the pt.    Blanchie Dessert, MD 01/31/15 1531

## 2015-01-31 NOTE — ED Notes (Signed)
Neurology at bedside.

## 2015-01-31 NOTE — ED Provider Notes (Signed)
Discussed care with Dr. Doy Mince of neurology who recommends that the patient can be discharged home, labs reviewed, MRI reviewed, no findings to suggest acute stroke or spinal cord lesion or injury. The patient and family members expressed their understanding and they are in agreement with the plan.  Kari Acosta, MD 01/31/15 Joen Laura

## 2015-01-31 NOTE — ED Notes (Signed)
Pt c/o of R sided numbness for past month that has increased in the last week.

## 2015-01-31 NOTE — Consult Note (Signed)
Reason for Consult: Progressive right sided weakness and numbness Referring Physician: Dr Sabra Heck  CC: Progressive right hemiparesis  HPI: [name] is an 81 y.o. female who presented to the emergency department this evening for evaluation of a 2 to three-week history of progressive right-sided weakness. Initially her symptoms were described as weak, heavy, and numb; however, in talking with the patient this evening she describes weakness involving the right lower extremity more than the right upper extremity. She denies numbness or tingling. There has been some occasional dizziness attributed to adjustments in her antihypertensive medications but there have been no falls. The patient normally ambulates without an assistive device although lately she has borrowed her stepson's walker for added support.  Multiple family members were present in the room and assisted with the history. The patient does not feel that she is depressed but she admits to being very anxious, not sleeping, and not eating well. She has lost about 10 pounds over the past 3-4 weeks. Her primary care physician, Dr Shawna Orleans, ordered an MRI of the brain performed 01/23/2015 that showed generalized atrophy but no acute infarct or massThe patient had another MRI performed here in the emergency department this evening with similar findings. Privately I was told by family members that the patient has been under some stress and anxiety related to upcoming thyroid surgery for a recently diagnosed mass as well as anxiety regarding a trip planned to celebrate her husband's 90th birthday. On exam the patient has excellent strength in all extremities. The family has also noted some slowing of the patient's speech and she tends to repeat herself occasionally but this is not altogether new. She has recently had elevated calcium levels and is being evaluated by an endocrinologist.  Past Medical History  Diagnosis Date  . Anxiety   . Hypertension   .  Glaucoma 12/31/2009  . ANXIETY 12/31/2009  . CHEST PAIN 12/08/2004  . FATIGUE 01/09/2011  . HYPERTENSION 12/31/2009  . LEFT BUNDLE BRANCH BLOCK 01/03/2010  . MALIGNANT MELANOMA, HX OF 01/03/2010  . MITRAL VALVE PROLAPSE 12/31/2009  . Raynaud's phenomenon     Past Surgical History  Procedure Laterality Date  . Eye surgery  1960  . Tonsillectomy  1934  . Breast surgery  1950    surgery    Family History  Problem Relation Age of Onset  . Heart disease Mother   . Kidney disease Father   . Heart attack Sister   . Cancer Brother     lung  . Hypertension Other   . Hyperlipidemia Other    No family history of strokes  Social History:  reports that she has never smoked. She has never used smokeless tobacco. The patient has occasional glass of wine. She does not use illegal drugs. She is married and lives in [place] with her husband. She has 2 daughters, 2 granddaughters and one great grandson.  Allergies  Allergen Reactions  . Codeine     Medications:  No current facility-administered medications for this encounter.   Current Outpatient Prescriptions  Medication Sig Dispense Refill  . amLODipine (NORVASC) 5 MG tablet Take 0.5 tablets (2.5 mg total) by mouth daily. (Patient taking differently: Take 2.5 mg by mouth 2 (two) times daily. ) 30 tablet 3  . Cholecalciferol (VITAMIN D) 2000 UNITS CAPS Take 1 capsule by mouth daily.      . dorzolamide-timolol (COSOPT) 22.3-6.8 MG/ML ophthalmic solution Place 1 drop into both eyes every 12 (twelve) hours.      [name] Kitchen  ipratropium (ATROVENT) 0.06 % nasal spray USE 2 SPRAYS INTO EACH NOSTRIL ONCE DAILY AS NEEDED FOR RUNNY NOSE SYMPTOMS 15 mL 5  . meclizine (ANTIVERT) 12.5 MG tablet Take 1 tablet (12.5 mg total) by mouth 3 (three) times daily as needed for dizziness. 30 tablet 0  . Probiotic Product (ALIGN) 4 MG CAPS Take 1 capsule by mouth daily.    . travoprost, benzalkonium, (TRAVATAN) 0.004 % ophthalmic solution Place 1 drop into both eyes at  bedtime.      . valsartan (DIOVAN) 320 MG tablet Take 320 mg by mouth daily. Take once daily.    . verapamil (CALAN-SR) 120 MG CR tablet TAKE ONE TABLET AT BEDTIME 90 tablet 1  . vitamin B-12 (CYANOCOBALAMIN) 250 MCG tablet Take 1 tablet (250 mcg total) by mouth daily. 90 tablet 1  . zoledronic acid (RECLAST) 5 MG/100ML SOLN injection Inject 5 mg into the vein once.    . potassium chloride (K-DUR) 10 MEQ tablet TAKE ONE TABLET BY MOUTH ONCE DAILY (Patient not taking: Reported on 01/31/2015) 90 tablet 1   I have reviewed the patient's current medications.  ROS: History obtained from the patient and and family  General ROS: The patient seems to tire more easily. Positive for 10 pound unintentional weight loss over 3-4 weeks Psychological ROS: Positive for anxiety but apparently no depression. Ophthalmic ROS: negative for - blurry vision, double vision, eye pain or loss of vision ENT ROS: negative for - epistaxis, nasal discharge, oral lesions, sore throat, tinnitus or vertigo Allergy and Immunology ROS: negative for - hives or itchy/watery eyes Hematological and Lymphatic ROS: negative for - bleeding problems, bruising or swollen lymph nodes Endocrine ROS: negative for - galactorrhea, hair pattern changes, polydipsia/polyuria or temperature intolerance Respiratory ROS: negative for - cough, hemoptysis, shortness of breath or wheezing Cardiovascular ROS: negative for - chest pain, dyspnea on exertion, edema or irregular heartbeat Gastrointestinal ROS: negative for - abdominal pain, diarrhea, hematemesis, nausea/vomiting or stool incontinence Genito-Urinary ROS: negative for - dysuria, hematuria, incontinence or urinary frequency/urgency Musculoskeletal ROS: negative for - joint swelling or muscular weakness. The patient denies arthritis symptoms. Neurological ROS: as noted in HPI Dermatological ROS: negative for rash and skin lesion changes   Physical Examination: Blood pressure 154/73,  pulse 72, temperature 97.6 F (36.4 C), temperature source Oral, resp. rate 15, SpO2 100 %.  Neurologic Examination Mental Status: Alert, oriented, thought content appropriate.  Speech fluent without evidence of aphasia.  Able to follow 3 step commands without difficulty. Cranial Nerves: II: Discs not visualized; Visual fields grossly normal, pupils equal, round, reactive to light and accommodation III,IV, VI: ptosis not present, extra-ocular motions intact bilaterally V,VII: smile symmetric, facial light touch sensation normal bilaterally VIII: hearing normal bilaterally IX,X: gag reflex present XI: bilateral shoulder shrug XII: midline tongue extension Motor: Right : Upper extremity   5/5, no drift   Left:     Upper extremity   5/5  Lower extremity   5/5     Lower extremity   5/5 Tone and bulk:normal tone throughout; no atrophy noted Sensory: Light touch intact throughout, bilaterally Deep Tendon Reflexes: 2+ and symmetric with absent AJ's bilaterally.  Positive frontal release signs bilaterally Plantars: Right: downgoing   Left: downgoing Cerebellar: Normal finger-to-nose testing bilaterally.  Mild heel to shin dysmetria with RLE.  Intact LLE heel to shin.  Rapid alternating movements intact bilaterally Gait: On standing patient weaves back and forth and reports that she is dizzy but can stand on each foot  independently.  Is scared to walk and takes small steps.     Laboratory Studies:   Basic Metabolic Panel:  Recent Labs Lab 01/31/15 1215 01/31/15 1231  NA 136 137  K 3.3* 3.4*  CL 101 101  CO2 28  --   GLUCOSE 99 97  BUN 10 11  CREATININE 0.79 0.80  CALCIUM 10.1  --     Liver Function Tests:  Recent Labs Lab 01/31/15 1215  AST 20  ALT 16  ALKPHOS 56  BILITOT 0.9  PROT 7.2  ALBUMIN 3.6   No results for input(s): LIPASE, AMYLASE in the last 168 hours. No results for input(s): AMMONIA in the last 168 hours.  CBC:  Recent Labs Lab 01/31/15 1215  01/31/15 1231  WBC 6.3  --   NEUTROABS 4.0  --   HGB 13.7 13.3  HCT 38.0 39.0  MCV 96.2  --   PLT 197  --     Cardiac Enzymes: No results for input(s): CKTOTAL, CKMB, CKMBINDEX, TROPONINI in the last 168 hours.  BNP: Invalid input(s): POCBNP  CBG: No results for input(s): GLUCAP in the last 168 hours.  Microbiology: Results for orders placed or performed in visit on 06/27/12  Culture, Urine     Status: None   Collection Time: 06/27/12  3:21 PM  Result Value Ref Range Status   Culture ESCHERICHIA COLI  Final    Comment: URINE   Colony Count 25,000 COLONIES/ML  Final   Organism ID, Bacteria ESCHERICHIA COLI  Final      Susceptibility   Escherichia coli -  (no method available)    AMPICILLIN >=32 Resistant     AMPICILLIN/SULBACTAM <=2 Sensitive     PIP/TAZO <=4 Sensitive     IMIPENEM <=0.25 Sensitive     CEFAZOLIN <=4 Sensitive     CEFOXITIN <=4 Sensitive     CEFTRIAXONE <=1 Sensitive     CEFTAZIDIME <=1 Sensitive     CEFEPIME <=1 Sensitive     GENTAMICIN <=1 Sensitive     TOBRAMYCIN <=1 Sensitive     CIPROFLOXACIN <=0.25 Sensitive     LEVOFLOXACIN <=0.12 Sensitive     NITROFURANTOIN <=16 Sensitive     TRIMETH/SULFA <=20 Sensitive     Coagulation Studies:  Recent Labs  01/31/15 1215  LABPROT 13.5  INR 1.02    Urinalysis:  Recent Labs Lab 01/31/15 1230  COLORURINE YELLOW  LABSPEC 1.011  PHURINE 7.0  GLUCOSEU NEGATIVE  HGBUR NEGATIVE  BILIRUBINUR NEGATIVE  KETONESUR 15*  PROTEINUR NEGATIVE  UROBILINOGEN 1.0  NITRITE NEGATIVE  LEUKOCYTESUR NEGATIVE    Lipid Panel:     Component Value Date/Time   CHOL 177 04/06/2014 0844   TRIG 91.0 04/06/2014 0844   TRIG 134 03/29/2009   HDL 43.20 04/06/2014 0844   CHOLHDL 4 04/06/2014 0844   VLDL 18.2 04/06/2014 0844   LDLCALC 116* 04/06/2014 0844   LDLCALC 27 03/29/2009    HgbA1C:  Lab Results  Component Value Date   HGBA1C 5.9 03/29/2009    Urine Drug Screen:     Component Value Date/Time    LABOPIA NONE DETECTED 01/31/2015 1230   COCAINSCRNUR NONE DETECTED 01/31/2015 1230   LABBENZ NONE DETECTED 01/31/2015 1230   AMPHETMU NONE DETECTED 01/31/2015 1230   THCU NONE DETECTED 01/31/2015 1230   LABBARB NONE DETECTED 01/31/2015 1230    Alcohol Level: No results for input(s): ETH in the last 168 hours.  Other results: EKG: Sinus rhythm rate 69 bpm with new left bundle branch block  Imaging:  Mr Brain Wo Contrast 01/31/2015    1. No acute intracranial abnormality or significant interval change.  2. Stable atrophy and diffuse white matter disease. This likely reflects the sequelae of chronic microvascular ischemia.     Mr Brain Wo Contrast 01/23/2015 Generalized atrophy with mild chronic microvascular ischemia. No acute infarct or mass lesion   Mikey Bussing PA-C [facility] Pager [phone] 01/31/2015, 5:02 PM  Patient seen and examined.  Clinical course and management discussed.  Necessary edits performed.  I agree with the above.  Assessment and plan of care developed and discussed below.     Assessment/Plan:  Pleasant 81 year old female with a history of progressive right-sided weakness. Neurological exam also noted to be normal with excellent strength in all extremities. The patient admits to anxiety and the family confirms that she has been experiencing stress and anxiety over some upcoming events. The time frame seems to correlate with her reported deficits. MRI of the brain performed one week ago was unremarkable.  MRI performed tonight personally reviewed and again is unremarkable.  Patient does admit to some dizziness on standing but is not orthostatic.  Questionable relationship to parathyroid abnormalities.  TSH, B12 normal.    Recommendations: 1.  Vestibular rehab 2.  B1, heavy metal screen, RPR, cryoglobulin, ESR 3.  Patient with scheduled follow up with PCP tomorrow.  May be referred to neurology as an outpatient at that time.    Alexis Goodell, MD [facility] [phone]  01/31/2015  7:04 PM

## 2015-02-01 ENCOUNTER — Encounter: Payer: Self-pay | Admitting: Internal Medicine

## 2015-02-01 ENCOUNTER — Ambulatory Visit (INDEPENDENT_AMBULATORY_CARE_PROVIDER_SITE_OTHER): Payer: PPO | Admitting: Internal Medicine

## 2015-02-01 VITALS — BP 130/72 | HR 83 | Temp 98.0°F

## 2015-02-01 DIAGNOSIS — E213 Hyperparathyroidism, unspecified: Secondary | ICD-10-CM

## 2015-02-01 DIAGNOSIS — I10 Essential (primary) hypertension: Secondary | ICD-10-CM

## 2015-02-01 DIAGNOSIS — R270 Ataxia, unspecified: Secondary | ICD-10-CM

## 2015-02-01 LAB — RPR: RPR: NONREACTIVE

## 2015-02-01 MED ORDER — ALPRAZOLAM 0.25 MG PO TABS: 0.2500 mg | ORAL_TABLET | Freq: Two times a day (BID) | ORAL | Status: DC | PRN

## 2015-02-01 NOTE — Assessment & Plan Note (Signed)
BP is stable.   BP: 130/72 mmHg

## 2015-02-01 NOTE — Progress Notes (Signed)
Subjective:    Patient ID: [name], female    DOB: May 04, 1923, 81 y.o.   MRN: [id number]  HPI  81 year old white female with history of hyperparathyroidism, hypertension and anxiety for emergency room follow-up. Patient evaluated on 01/31/2015 for progressively worsening right-sided weakness and unstable gait. Patient was seen approximately week ago with similar symptoms. MRI of brain was obtained. It is negative for any acute findings.  Patient is accompanied by supportive husband and daughter. She progressively got worse and was seen in emergency room for right-sided weakness and difficulty walking. She was evaluated by Dr. Doy Mince (neurologist.). Repeat MRI of brain was negative for acute infarct.  Over last 1-2 months patient has had labile blood pressure readings and symptoms of anxiety. There was concern that her history of regular alcohol consumption was contributed until about blood pressure. Patient discontinued alcohol use.  It wasn't clear whether some of her anxiety symptoms related to hyperparathyroidism. She was recently seen by her endocrinologist. She was taken off hydrochlorothiazide and treated with IV ReClast. Her calcium levels decreased.  It was felt unlikely that her neurologic symptoms related to hyperparathyroidism.  Screening for pheochromocytoma was negative.  Review of Systems Negative for significant urinary incontinence.  She wears small pad for intermittent symptoms.  She denies symptoms of dizziness or vertigo.  No improvement with trial of meclizine.    Past Medical History  Diagnosis Date  . Anxiety   . Hypertension   . Glaucoma 12/31/2009  . ANXIETY 12/31/2009  . CHEST PAIN 12/08/2004  . FATIGUE 01/09/2011  . HYPERTENSION 12/31/2009  . LEFT BUNDLE BRANCH BLOCK 01/03/2010  . MALIGNANT MELANOMA, HX OF 01/03/2010  . MITRAL VALVE PROLAPSE 12/31/2009  . Raynaud's phenomenon     History   Social History  . Marital Status: Married    Spouse Name: N/A  .  Number of Children: N/A  . Years of Education: N/A   Occupational History  . Not on file.   Social History Main Topics  . Smoking status: Never Smoker   . Smokeless tobacco: Never Used     Comment: never used tobacco   . Alcohol Use: Not on file  . Drug Use: Not on file  . Sexual Activity: Not on file   Other Topics Concern  . Not on file   Social History Narrative   Lives with spouse (2nd marriage in 2009, 1st widowed in 1979) husband-- Vivan Vanderveer   Enjoys golf, not much aerobic activity--spends time in [place] part of the year.          Past Surgical History  Procedure Laterality Date  . Eye surgery  1960  . Tonsillectomy  1934  . Breast surgery  1950    surgery    Family History  Problem Relation Age of Onset  . Heart disease Mother   . Kidney disease Father   . Heart attack Sister   . Cancer Brother     lung  . Hypertension Other   . Hyperlipidemia Other     Allergies  Allergen Reactions  . Codeine     Current Outpatient Prescriptions on File Prior to Visit  Medication Sig Dispense Refill  . amLODipine (NORVASC) 5 MG tablet Take 0.5 tablets (2.5 mg total) by mouth daily. (Patient taking differently: Take 2.5 mg by mouth 2 (two) times daily. ) 30 tablet 3  . dorzolamide-timolol (COSOPT) 22.3-6.8 MG/ML ophthalmic solution Place 1 drop into both eyes every 12 (twelve) hours.      [name] Kitchen  ipratropium (ATROVENT) 0.06 % nasal spray USE 2 SPRAYS INTO EACH NOSTRIL ONCE DAILY AS NEEDED FOR RUNNY NOSE SYMPTOMS 15 mL 5  . potassium chloride (K-DUR) 10 MEQ tablet TAKE ONE TABLET BY MOUTH ONCE DAILY 90 tablet 1  . Probiotic Product (ALIGN) 4 MG CAPS Take 1 capsule by mouth daily.    . travoprost, benzalkonium, (TRAVATAN) 0.004 % ophthalmic solution Place 1 drop into both eyes at bedtime.      . valsartan (DIOVAN) 320 MG tablet Take 320 mg by mouth daily. Take once daily.    . verapamil (CALAN-SR) 120 MG CR tablet TAKE ONE TABLET AT BEDTIME 90 tablet 1  . vitamin B-12  (CYANOCOBALAMIN) 250 MCG tablet Take 1 tablet (250 mcg total) by mouth daily. 90 tablet 1  . zoledronic acid (RECLAST) 5 MG/100ML SOLN injection Inject 5 mg into the vein once.     No current facility-administered medications on file prior to visit.    BP 130/72 mmHg  Pulse 83  Temp(Src) 98 F (36.7 C) (Oral)    Objective:   Physical Exam  Constitutional: She is oriented to person, place, and time. She appears well-developed and well-nourished. No distress.  HENT:  Head: Normocephalic and atraumatic.  Right Ear: External ear normal.  Mouth/Throat: Oropharynx is clear and moist.  Uvula is midline.  Eyes: EOM are normal. Pupils are equal, round, and reactive to light.  Neck: Neck supple.  Cardiovascular: Normal rate and normal heart sounds.   No murmur heard. Pulmonary/Chest: Effort normal and breath sounds normal. She has no wheezes.  Musculoskeletal:  Trace lower ext edema  Neurological: She is alert and oriented to person, place, and time.  Question mild left facial droop.  No upper ext weakness.  Decreased sensation right lower ext.  Decreased sensation to temp and vibration of lower ext bilaterally. Unstable gait.  Patient unable to walk without assistance. Patellar reflex +3 bilaterally, 0-1 achilles reflex bilaterally, No dysmetria.  Speech is normal  Skin: Skin is warm and dry.  Psychiatric: She has a normal mood and affect. Her behavior is normal.          Assessment & Plan:

## 2015-02-01 NOTE — Progress Notes (Signed)
Pre visit review using our clinic review tool, if applicable. No additional management support is needed unless otherwise documented below in the visit note. 

## 2015-02-01 NOTE — Assessment & Plan Note (Signed)
She has appt with surgeon tomorrow.  Consider deferring surgery until neurologic issues stable.

## 2015-02-01 NOTE — Assessment & Plan Note (Addendum)
MRI of Brain negative.  Patient evaluated by Dr. Doy Mince in hospital setting.  Repeat MRI of Brain negative for acute process.  Clinically, she is experiencing progressive symptoms.  Agree with checking B1 and heavy metal screen.  She has decreased sensation to temp and vibration suggestive of peripheral neuropathy.  RPR negative. Check FTA antibody (Neurosyphilis unlikely).  Check serum copper levels.  Agree with heavy metal screen.  Patient advised to start OTC B complex supplement.  Patient has hx of chronic alcohol consumption.  Unclear whether her anxiety is a symptoms of alcohol withdrawal.  Patient was advised to stop alcohol due to labile BP readings.  Refer to Dr. Posey Pronto for further evaluation.   We discussed possibility of normal pressure hydrocephalus. No history of urinary incontinence. Defer to neurology whether there is enough clinical suspicion to perform lumbar puncture.  Also defer to neurology to consider additional imaging studies-MRI cervical, thoracic and lumbar spine.    Consider MRA of Brain to rule out vertebrobasilar insufficiency.  Arrange home health.

## 2015-02-02 ENCOUNTER — Encounter: Payer: Self-pay | Admitting: Internal Medicine

## 2015-02-02 ENCOUNTER — Telehealth: Payer: Self-pay | Admitting: Internal Medicine

## 2015-02-02 ENCOUNTER — Ambulatory Visit (INDEPENDENT_AMBULATORY_CARE_PROVIDER_SITE_OTHER): Payer: Self-pay | Admitting: Surgery

## 2015-02-02 LAB — HEAVY METALS, BLOOD
Arsenic: <3 mcg/L (ref <23)
Lead: <2 ug/dL (ref <10)
Mercury: <4 mcg/L (ref <=10)

## 2015-02-02 NOTE — Telephone Encounter (Signed)
emmi emailed °

## 2015-02-03 ENCOUNTER — Telehealth: Payer: Self-pay | Admitting: Internal Medicine

## 2015-02-03 LAB — VITAMIN B1: Vitamin B1 (Thiamine): 113.5 nmol/L (ref 66.5–200.0)

## 2015-02-03 LAB — CERULOPLASMIN: Ceruloplasmin: 25 mg/dL (ref 18–53)

## 2015-02-03 LAB — COPPER, SERUM: Copper: 98 ug/dL (ref 70–175)

## 2015-02-03 LAB — FLUORESCENT TREPONEMAL AB(FTA)-IGG-BLD: Fluorescent Treponemal ABS: NONREACTIVE

## 2015-02-03 NOTE — Telephone Encounter (Signed)
ok thanks

## 2015-02-03 NOTE — Telephone Encounter (Signed)
See if she can come on at 1 on the same day and open a slot

## 2015-02-03 NOTE — Telephone Encounter (Signed)
Pt called to see if his wife can come in on 02/15/15. Husband has an appt at 1:15 that day and would like to know it wife can come in also

## 2015-02-04 ENCOUNTER — Ambulatory Visit (INDEPENDENT_AMBULATORY_CARE_PROVIDER_SITE_OTHER): Payer: PPO | Admitting: Neurology

## 2015-02-04 ENCOUNTER — Ambulatory Visit (HOSPITAL_COMMUNITY): Payer: PPO

## 2015-02-04 ENCOUNTER — Encounter: Payer: Self-pay | Admitting: Neurology

## 2015-02-04 VITALS — BP 148/70 | HR 78 | Ht 66.0 in | Wt 143.1 lb

## 2015-02-04 DIAGNOSIS — R278 Other lack of coordination: Secondary | ICD-10-CM

## 2015-02-04 DIAGNOSIS — G609 Hereditary and idiopathic neuropathy, unspecified: Secondary | ICD-10-CM

## 2015-02-04 LAB — RHEUMATOID FACTOR: RHEUMATOID FACTOR: 29 [IU]/mL — AB (ref <=14)

## 2015-02-04 NOTE — Progress Notes (Addendum)
[facility] Clinic Note - Initial Visit   Date: 02/04/2015   DANAJA LASOTA MRN: [id number] DOB: 06/29/1923   Dear Dr. Artist Pais:  Thank you for your kind referral of [name] for consultation of imbalance and gait difficulty. Although her history is well known to you, please allow [place] to reiterate it for the purpose of our medical record. The patient was accompanied to the clinic by husband and son-in-law who also provides collateral information.     History of Present Illness: [name] is a 81 y.o. 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    She has always been very active and goes to the gym twice per week and has a trainer she works with.  She was walking independently until mid-February 2016, when she felt that her legs would not work making it difficult to walk.  She feels very imbalanced when even standing.  She first started feeling wobbly and noticed that she would have to hold on to things.  Symptoms started quite suddenly and have been worsening since then.  She started using a rollator immediately which helps her, because otherwise she would be unable to walk.  She went to see her PCP on February 17 for her gait imbalance, so MRI was ordered to rule out cerebellar stroke. She underwent imaging on February 20 which did not show any acute infarct, only generalized atrophy. Over the next few days symptoms continued to worsen, and on February 28 patient presented to the emergency department for evaluation. She was seen by Dr. Thad Ranger, neuro hospitalist, noticed leg weakness in addition to gait instability, so repeat MRI brain was performed and again, did not show any new findings. In the meantime, patient also had extensive laboratory testing including vitamin B1, heavy metals, ESR,  RPR, ethanol, TSH, free T4/free T3, vitamin B 12 which was normal.  She complains of sleeping problems, described as she wakes up with anxiety. She denies any tremors, difficulty swallowing/talking, or falls. Denies any problems with smelling, changes in handwriting, cognitive problems, or vivid dreams.  Patient was referred to neurology for further evaluation of gait instability. She endorses mild numbness and tingling of the right lower extremity, but does not have a sensation of weakness. She does have poor appetite and has lost 10-15 lb unintentionally over the past few weeks.  She has been drinking 1-2 glasses of wine per night, but stopped this about three weeks ago.  Out-side paper records, electronic medical record, and images have been reviewed where available and summarized as:  MRI brain 01/31/2015: 1. No acute intracranial abnormality or significant interval change. 2. Stable atrophy and diffuse white matter disease. This likely reflects the sequelae of chronic microvascular ischemia.  MRI brain 01/23/2015:  Generalized atrophy with mild chronic microvascular ischemia. No acute infarct or mass lesion  Labs 08/06/2014: Vitamin B 12 530 Labs 01/2015: TSH 2.49, calcium 11.5, PTH 71, ethanol <5, RPR negative, ESR 39, heavy metal screen negative, vitamin B1 113  Past Medical History  Diagnosis Date  . Anxiety   . Hypertension   . Glaucoma 12/31/2009  . ANXIETY 12/31/2009  . CHEST PAIN 12/08/2004  . FATIGUE 01/09/2011  . HYPERTENSION 12/31/2009  . LEFT BUNDLE BRANCH BLOCK 01/03/2010  . MALIGNANT MELANOMA, HX OF 01/03/2010  . MITRAL VALVE PROLAPSE 12/31/2009  . Raynaud's phenomenon     Past Surgical History  Procedure Laterality Date  . Eye surgery  1960  . Tonsillectomy  1934  . Breast surgery  1950    surgery     Medications:  Current Outpatient Prescriptions on File Prior to Visit  Medication Sig Dispense Refill  . ALPRAZolam (XANAX) 0.25 MG tablet Take 1 tablet (0.25 mg total)  by mouth 2 (two) times daily as needed for anxiety. 60 tablet 0  . amLODipine (NORVASC) 5 MG tablet Take 0.5 tablets (2.5 mg total) by mouth daily. (Patient taking differently: Take 2.5 mg by mouth 2 (two) times daily. ) 30 tablet 3  . Cholecalciferol (VITAMIN D3) 3000 UNITS TABS Take 1 capsule by mouth daily.    . dorzolamide-timolol (COSOPT) 22.3-6.8 MG/ML ophthalmic solution Place 1 drop into both eyes every 12 (twelve) hours.      [name] Kitchen ipratropium (ATROVENT) 0.06 % nasal spray USE 2 SPRAYS INTO EACH NOSTRIL ONCE DAILY AS NEEDED FOR RUNNY NOSE SYMPTOMS 15 mL 5  . potassium chloride (K-DUR) 10 MEQ tablet TAKE ONE TABLET BY MOUTH ONCE DAILY 90 tablet 1  . Probiotic Product (ALIGN) 4 MG CAPS Take 1 capsule by mouth daily.    . travoprost, benzalkonium, (TRAVATAN) 0.004 % ophthalmic solution Place 1 drop into both eyes at bedtime.      . valsartan (DIOVAN) 320 MG tablet Take 320 mg by mouth daily. Take once daily.    . verapamil (CALAN-SR) 120 MG CR tablet TAKE ONE TABLET AT BEDTIME 90 tablet 1  . vitamin B-12 (CYANOCOBALAMIN) 250 MCG tablet Take 1 tablet (250 mcg total) by mouth daily. 90 tablet 1  . zoledronic acid (RECLAST) 5 MG/100ML SOLN injection Inject 5 mg into the vein once.     No current facility-administered medications on file prior to visit.    Allergies:  Allergies  Allergen Reactions  . Codeine     Family History: Family History  Problem Relation Age of Onset  . Heart disease Mother   . Kidney disease Father   . Heart attack Sister   . Cancer Brother     lung  . Hypertension Other   . Hyperlipidemia Other   . Healthy Daughter     x2    Social History: History   Social History  . Marital Status: Married    Spouse Name: N/A  . Number of Children: N/A  . Years of Education: N/A   Occupational History  . Not on file.   Social History Main Topics  . Smoking status: Never Smoker   . Smokeless tobacco: Never Used     Comment: never used tobacco   . Alcohol  Use: 0.0 oz/week    0 Standard drinks or equivalent per week     Comment: drinks wine one glass a night  . Drug Use: No  . Sexual Activity: Not on file   Other Topics Concern  . Not on file   Social History Narrative   Lives with spouse (2nd marriage in 2009, 1st widowed in 1979) husband-- Cana Mignano   Enjoys golf, not much aerobic activity--spends time in [place] part of the year.     Lives in a 2 story home.     Retired from Warehouse manager work          Review of Systems:  CONSTITUTIONAL: No fevers, chills, night sweats, +weight loss.   EYES: No visual changes or eye pain ENT: No hearing changes.  No history of nose bleeds.   RESPIRATORY: No cough, wheezing and shortness of breath.  CARDIOVASCULAR: Negative for chest pain, and palpitations.   GI: Negative for abdominal discomfort, blood in stools or black stools.  No recent change in bowel habits.   GU:  No history of incontinence.   MUSCLOSKELETAL: No history of joint pain or swelling.  No myalgias.   SKIN: Negative for lesions, rash, and itching.   HEMATOLOGY/ONCOLOGY: Negative for prolonged bleeding, bruising easily, and swollen nodes.  No history of cancer.   ENDOCRINE: Negative for cold or heat intolerance, polydipsia or goiter.   PSYCH:  +depression + anxiety symptoms.   NEURO: As Above.   Vital Signs:  BP 148/70 mmHg  Pulse 78  Ht $R'5\' 6"'zw$  (1.676 m)  Wt 143 lb 2 oz (64.921 kg)  BMI 23.11 kg/m2  SpO2 98%   General Medical Exam:   General:  Well appearing, comfortable.   Eyes/ENT: see cranial nerve examination.   Neck: No masses appreciated.  Full range of motion without tenderness.  No carotid bruits. Respiratory:  Clear to auscultation, good air entry bilaterally.   Cardiac:  Regular rate and rhythm, no murmur.   Extremities:  No deformities, edema, or skin discoloration.  Skin:  No rashes or lesions.  Neurological Exam: MENTAL STATUS including orientation to time, place, person, recent and remote memory,  attention span and concentration, language, and fund of knowledge is normal.  Speech is not dysarthric.  No evidence of apraxia.  Blunted affect, but smiles appropriately.  CRANIAL NERVES: II:  No visual field defects.  Unremarkable fundi.   III-IV-VI: Pupils equal round and reactive to light.  Upward gaze is slightly limited bilaterally, otherwise extra-ocular eye movements are intact.  No nystagmus.  Mild bilateral ptosis, without worsening with sustained upward gaze.   V:  Normal facial sensation.  Jaw jerk is absent.   VII:  Normal facial symmetry and movements.  No pathologic facial reflexes.  VIII:  Normal hearing and vestibular function.   IX-X:  Normal palatal movement.   XI:  Normal shoulder shrug and head rotation.   XII:  Normal tongue strength and range of motion, no deviation or fasciculation.  MOTOR:  No tremors or abnormal movements.  Tone is normal. No drift.    Right Upper Extremity:    Left Upper Extremity:    Deltoid  5/5   Deltoid  5/5   Biceps  5/5   Biceps  5/5   Triceps  5/5   Triceps  5/5   Wrist extensors  5/5   Wrist extensors  5/5   Wrist flexors  5/5   Wrist flexors  5/5   Finger extensors  5/5   Finger extensors  5/5   Finger flexors  5/5   Finger flexors  5/5   Dorsal interossei  5/5   Dorsal interossei  5/5   Abductor pollicis  5/5   Abductor pollicis  5/5   Tone (Ashworth scale)  0  Tone (Ashworth scale)  0   Right Lower Extremity:    Left Lower Extremity:    Hip flexors  5/5   Hip flexors  5/5   Hip extensors  5/5   Hip extensors  5/5   Knee flexors  5/5   Knee flexors  5/5   Knee extensors  5/5   Knee extensors  5/5   Dorsiflexors  5/5   Dorsiflexors  5/5   Plantarflexors  5/5   Plantarflexors  5/5   Toe extensors  5/5   Toe extensors  5/5   Toe flexors  5/5  Toe flexors  5/5   Tone (Ashworth scale)  0  Tone (Ashworth scale)  0   MSRs:  Right                                                                 Left brachioradialis 2+   brachioradialis 2+  biceps 2+  biceps 2+  triceps 2+  triceps 2+  patellar 2+  patellar 2+  ankle jerk 0  ankle jerk 0  Hoffman no  Hoffman no  plantar response down  plantar response down   SENSORY:  Vibration absent distal to ankles, pin prick is reduced in the legs, proprioception is impaired.  Rhomberg testing is positive.   COORDINATION/GAIT:  There is hesitancy with finger to nose-testing, but no dysmeteria.  Intact rapid alternating movements bilaterally.  Unable to rise from a chair without using arms.  She has marked unsteadiness without one-person assist when standing.  She is able to take a few steps assisted, but is very slow and cautious.   IMPRESSION: Mrs. Steffek is a delightful 81 year-old female who was previously very active until mid-February when she developed subacute onset of progressive gait instability.  She is now only able to stand or walk with assistance.  Her exam does not disclose any motor weakness, but there is prominent sensory ataxia and evidence of neuropathy affecting the legs. Considerations include acute cerebellitis (viral), peripheral neuropathy, neuronopathy, polyradiculoneuropathy, or less likely neurodegenerative condition such as multiple system atrophy, the latter is more insidious in onset.  For completeness, I will check a few additional labs and obtain electrodiagnostic testing of the right leg. I am not entirely convinced that her sensory ataxia is due to hyperparathyroidism, however I would agree that removal of the gland will  at least the question if this is contributing to her current presentation. MRI brain was personally reviewed and does not show any evidence of cerebellar stroke or focal atrophy.  PLAN/RECOMMENDATIONS:  1.  Check vitamin B6, vitamin E, ENA, zinc, RF 2.  EMG of the right arm and leg 3.  CSF testing pending results of labs 4.  Fall precautions discused 5.  Return to clinic in 1 month   The duration of this appointment visit  was 60 minutes of face-to-face time with the patient.  Greater than 50% of this time was spent in counseling, explanation of diagnosis, planning of further management, and coordination of care.   Thank you for allowing me to participate in patient's care.  If I can answer any additional questions, I would be pleased to do so.    Sincerely,    Wynetta Seith K. Posey Pronto, DO

## 2015-02-04 NOTE — Patient Instructions (Signed)
1.  Check blood work 2.  EMG of the right arm and leg 3.  Continue to use your walker for support 4.  Please call my office if you surgery gets delayed, so we can coordinate your EMG 5.  Return to clinic in 1 month or sooner as needed

## 2015-02-05 LAB — ENA 9 PANEL
CENTROMERE AB SCREEN: NEGATIVE
DS DNA AB: 1 [IU]/mL
ENA SM Ab Ser-aCnc: <1
JO-1 ANTIBODY, IGG: NEGATIVE
RIBOSOMAL P PROTEIN AB: NEGATIVE
SCLERODERMA (SCL-70) (ENA) ANTIBODY, IGG: NEGATIVE
SM/RNP: <1
SSA (Ro) (ENA) Antibody, IgG: <1
SSB (La) (ENA) Antibody, IgG: <1

## 2015-02-06 LAB — CRYOGLOBULIN

## 2015-02-07 LAB — VITAMIN B6: VITAMIN B6: 8.6 ng/mL (ref 2.1–21.7)

## 2015-02-07 LAB — VITAMIN E
GAMMA-TOCOPHEROL (VIT E): 0.9 mg/L (ref <=4.3)
Vitamin E (Alpha Tocopherol): 10.5 mg/L (ref 5.7–19.9)

## 2015-02-07 LAB — ZINC: ZINC: 53 ug/dL — AB (ref 60–130)

## 2015-02-08 ENCOUNTER — Telehealth: Payer: Self-pay | Admitting: Neurology

## 2015-02-08 ENCOUNTER — Other Ambulatory Visit (HOSPITAL_COMMUNITY): Payer: Self-pay | Admitting: Internal Medicine

## 2015-02-08 ENCOUNTER — Encounter: Payer: Self-pay | Admitting: Internal Medicine

## 2015-02-08 ENCOUNTER — Ambulatory Visit (HOSPITAL_COMMUNITY): Admission: RE | Admit: 2015-02-08 | Payer: PPO | Source: Ambulatory Visit

## 2015-02-08 NOTE — Telephone Encounter (Signed)
Called and discuss the results of the patient's lab testing. Zinc is borderline low. Recommended she start taking multivitamin or zinc 50 mg daily. Additionally, rheumatoid factor is elevated and may suggest systemic inflammation she has no signs of arthralgias or joint swelling.  Because of the acute nature of her symptoms, CSF testing is the next step.  Patient is scheduled to have surgery for her parathyroid gland, although gait has not been confirmed. In the meantime, I would like to initiate tentative arrangements for a lumbar puncture, which can be changed as needed.  Caryl Pina, please order the following testing:  CSF cell count and diff, protein, glucose, routine cultures, fungal cultures, cryptococcus antigen, IgG index, oligoclonal bands, flow cytology, flow cytometry (cytospin), ACE, CSF lyme PCR, CSF VZV PCR, CSF HSV, CSF echovirus.     Donika K. Posey Pronto, DO

## 2015-02-10 ENCOUNTER — Encounter: Payer: Self-pay | Admitting: Internal Medicine

## 2015-02-10 ENCOUNTER — Other Ambulatory Visit: Payer: Self-pay | Admitting: *Deleted

## 2015-02-10 DIAGNOSIS — R2681 Unsteadiness on feet: Secondary | ICD-10-CM

## 2015-02-10 DIAGNOSIS — G609 Hereditary and idiopathic neuropathy, unspecified: Secondary | ICD-10-CM

## 2015-02-10 NOTE — Telephone Encounter (Signed)
Order placed.  Called Danielle to let her know the order is in.  Left message for her to call me back.

## 2015-02-15 ENCOUNTER — Ambulatory Visit: Payer: PPO | Admitting: Internal Medicine

## 2015-02-15 ENCOUNTER — Encounter (HOSPITAL_COMMUNITY): Payer: Self-pay | Admitting: *Deleted

## 2015-02-15 MED ORDER — CEFAZOLIN SODIUM-DEXTROSE 2-3 GM-% IV SOLR
2.0000 g | INTRAVENOUS | Status: AC
Start: 2015-02-16 — End: 2015-02-16
  Administered 2015-02-16: 2 g via INTRAVENOUS
  Filled 2015-02-15: qty 50

## 2015-02-15 NOTE — H&P (Signed)
General Surgery [facility]  Dewayne Jurek 02/02/2015 11:38 AM  DOB: [birth year] Married / Language: Cleophus Molt / Race: White Female  History of Present Illness Earnstine Regal MD; 02/02/2015 12:15 PM)  The patient is a 81 year old female who presents with a parathyroid neoplasm. Patient is referred by Dr. Delrae Rend for evaluation of primary hyperparathyroidism. Patient was diagnosed approximately 3 years ago. Calcium levels have ranged from 10.7-11.2. Intact PTH level is elevated at 84. 24-hour urine collection is normal at 117. Vitamin D level is normal at 31. Patient has bone density scan showing osteopenia. Patient underwent nuclear medicine parathyroid scan on January 26, 2015. This localizes a parathyroid adenoma to the right superior position. Patient presents today accompanied by her husband and her son to discuss parathyroid surgery.   Other Problems (Ammie Eversole, LPN; 01/05/9797 92:11 AM) Anxiety Disorder Depression High blood pressure Melanoma Thyroid Cancer Thyroid Disease  Past Surgical History (Ammie Eversole, LPN; 08/08/1739 81:44 AM) Breast Biopsy Left. Breast Mass; Local Excision Left. Cataract Surgery Left. Oral Surgery Tonsillectomy  Diagnostic Studies History (Ammie Eversole, LPN; 07/04/8562 14:97 AM) Mammogram 1-3 years ago Pap Smear >5 years ago  Allergies (Ammie Eversole, LPN; 0/01/6377 58:85 AM) Codeine and Related  Medication History (Ammie Eversole, LPN; 0/01/7740 28:78 AM) Align (4MG  Capsule, Oral) Active. ALPRAZolam (0.25MG  Tablet, Oral) Active. AmLODIPine Besylate (5MG  Tablet, Oral) Active. Dorzolamide HCl-Timolol Mal PF (22.3-6.8MG /ML Solution, Ophthalmic) Active. Ipratropium Bromide (0.06% Solution, Nasal) Active. Potassium Chloride ER (10MEQ Capsule ER, Oral) Active. Travoprost (0.004% Solution, Ophthalmic) Active. Valsartan (320MG  Tablet, Oral) Active. Verapamil HCl ER (120MG  Capsule ER 24HR,  Oral) Active. Vitamin B 12 (250MCG Lozenge, Oral) Active. Vitamin D3 (3000UNIT Tablet, Oral) Active. Zoledronic Acid (5MG /100ML Solution, Intravenous) Active.  Pregnancy / Birth History Aleatha Borer, LPN; 05/11/6719 94:70 AM) Age at menarche 42 years. Age of menopause 57-55 Gravida 2 Maternal age 58-30 Para 2  Review of Systems (Ammie Eversole LPN; 08/10/2835 62:94 AM) General Present- Appetite Loss and Weight Loss. Not Present- Chills, Fatigue, Fever, Night Sweats and Weight Gain. Skin Not Present- Change in Wart/Mole, Dryness, Hives, Jaundice, New Lesions, Non-Healing Wounds, Rash and Ulcer. HEENT Present- Hearing Loss, Ringing in the Ears and Wears glasses/contact lenses. Not Present- Earache, Hoarseness, Nose Bleed, Oral Ulcers, Seasonal Allergies, Sinus Pain, Sore Throat, Visual Disturbances and Yellow Eyes. Respiratory Not Present- Bloody sputum, Chronic Cough, Difficulty Breathing, Snoring and Wheezing. Breast Not Present- Breast Mass, Breast Pain, Nipple Discharge and Skin Changes. Cardiovascular Present- Swelling of Extremities. Not Present- Chest Pain, Difficulty Breathing Lying Down, Leg Cramps, Palpitations, Rapid Heart Rate and Shortness of Breath. Gastrointestinal Not Present- Abdominal Pain, Bloating, Bloody Stool, Change in Bowel Habits, Chronic diarrhea, Constipation, Difficulty Swallowing, Excessive gas, Gets full quickly at meals, Hemorrhoids, Indigestion, Nausea, Rectal Pain and Vomiting. Female Genitourinary Not Present- Frequency, Nocturia, Painful Urination, Pelvic Pain and Urgency. Musculoskeletal Not Present- Back Pain, Joint Pain, Joint Stiffness, Muscle Pain, Muscle Weakness and Swelling of Extremities. Neurological Present- Decreased Memory, Numbness, Trouble walking and Weakness. Not Present- Fainting, Headaches, Seizures, Tingling and Tremor. Psychiatric Present- Anxiety, Change in Sleep Pattern, Depression and Fearful. Not Present- Bipolar and Frequent  crying. Hematology Not Present- Easy Bruising, Excessive bleeding, Gland problems, HIV and Persistent Infections.   Vitals (Ammie Eversole LPN; 06/08/5464 03:54 AM) 02/02/2015 11:39 AM Weight: 142.4 lb Height: 65in Body Surface Area: 1.72 m Body Mass Index: 23.7 kg/m Temp.: 97.97F(Oral)  Pulse: 82 (Regular)  BP: 134/76 (Sitting, Left Arm, Standard)    Physical Exam (  Earnstine Regal MD; 02/02/2015 12:16 PM)  General - appears comfortable, no distress; not diaphorectic; frail  HEENT - normocephalic; sclerae clear, gaze conjugate; mucous membranes moist, dentition fair; voice normal  Neck - symmetric on extension; no palpable anterior or posterior cervical adenopathy; no palpable masses in the thyroid bed  Chest - clear bilaterally with rhonchi, rales, or wheeze  Cor - regular rhythm with normal rate; no significant murmur  Ext - non-tender without significant edema or lymphedema  Neuro - grossly intact; mild bilat tremor    Assessment & Plan Earnstine Regal MD; 02/02/2015 12:18 PM)  PRIMARY HYPERPARATHYROIDISM (252.01  E21.0)  The patient is accompanied by her husband and her son-in-law. She has primary hyperparathyroidism. I provided them with written literature on hyperparathyroidism to review at home.  I have recommended proceeding with minimally invasive parathyroidectomy as an outpatient surgical procedure. We have discussed the risk and benefits of the procedure including the risk of recurrent laryngeal nerve injury. We have discussed the possible need for further surgery in the event of an ectopic gland or multiple gland disease. We have discussed the surgical incision and the postoperative course to be anticipated. Shunt and her family understand and wish to proceed in the near future.  The risks and benefits of the procedure have been discussed at length with the patient. The patient understands the proposed procedure, potential alternative treatments, and the  course of recovery to be expected. All of the patient's questions have been answered at this time. The patient wishes to proceed with surgery.  Earnstine Regal, MD, [facility] Office: [phone]

## 2015-02-16 ENCOUNTER — Observation Stay (HOSPITAL_COMMUNITY)
Admission: RE | Admit: 2015-02-16 | Discharge: 2015-02-18 | Disposition: A | Discharge status: Skilled Nursing Facility | Payer: PPO | Source: Ambulatory Visit | Attending: Surgery | Admitting: Surgery

## 2015-02-16 ENCOUNTER — Ambulatory Visit (HOSPITAL_COMMUNITY): Payer: PPO | Admitting: Anesthesiology

## 2015-02-16 ENCOUNTER — Encounter (HOSPITAL_COMMUNITY)
Admission: RE | Disposition: A | Discharge status: Skilled Nursing Facility | Payer: Self-pay | Source: Ambulatory Visit | Attending: Surgery

## 2015-02-16 ENCOUNTER — Encounter (HOSPITAL_COMMUNITY): Payer: Self-pay | Admitting: *Deleted

## 2015-02-16 DIAGNOSIS — I10 Essential (primary) hypertension: Secondary | ICD-10-CM | POA: Diagnosis present

## 2015-02-16 DIAGNOSIS — F329 Major depressive disorder, single episode, unspecified: Secondary | ICD-10-CM | POA: Diagnosis not present

## 2015-02-16 DIAGNOSIS — R531 Weakness: Secondary | ICD-10-CM | POA: Diagnosis present

## 2015-02-16 DIAGNOSIS — H409 Unspecified glaucoma: Secondary | ICD-10-CM | POA: Insufficient documentation

## 2015-02-16 DIAGNOSIS — E876 Hypokalemia: Secondary | ICD-10-CM | POA: Insufficient documentation

## 2015-02-16 DIAGNOSIS — F508 Other eating disorders: Secondary | ICD-10-CM | POA: Diagnosis not present

## 2015-02-16 DIAGNOSIS — D351 Benign neoplasm of parathyroid gland: Secondary | ICD-10-CM | POA: Diagnosis present

## 2015-02-16 DIAGNOSIS — I341 Nonrheumatic mitral (valve) prolapse: Secondary | ICD-10-CM | POA: Insufficient documentation

## 2015-02-16 DIAGNOSIS — F419 Anxiety disorder, unspecified: Secondary | ICD-10-CM | POA: Diagnosis not present

## 2015-02-16 DIAGNOSIS — Z85828 Personal history of other malignant neoplasm of skin: Secondary | ICD-10-CM | POA: Insufficient documentation

## 2015-02-16 DIAGNOSIS — E43 Unspecified severe protein-calorie malnutrition: Secondary | ICD-10-CM | POA: Insufficient documentation

## 2015-02-16 DIAGNOSIS — E210 Primary hyperparathyroidism: Secondary | ICD-10-CM | POA: Diagnosis present

## 2015-02-16 DIAGNOSIS — Z885 Allergy status to narcotic agent status: Secondary | ICD-10-CM | POA: Diagnosis not present

## 2015-02-16 DIAGNOSIS — I7300 Raynaud's syndrome without gangrene: Secondary | ICD-10-CM | POA: Insufficient documentation

## 2015-02-16 DIAGNOSIS — R29898 Other symptoms and signs involving the musculoskeletal system: Secondary | ICD-10-CM

## 2015-02-16 DIAGNOSIS — K5900 Constipation, unspecified: Secondary | ICD-10-CM | POA: Insufficient documentation

## 2015-02-16 DIAGNOSIS — C44311 Basal cell carcinoma of skin of nose: Secondary | ICD-10-CM | POA: Diagnosis not present

## 2015-02-16 DIAGNOSIS — Z9842 Cataract extraction status, left eye: Secondary | ICD-10-CM | POA: Diagnosis not present

## 2015-02-16 DIAGNOSIS — E213 Hyperparathyroidism, unspecified: Secondary | ICD-10-CM | POA: Diagnosis present

## 2015-02-16 HISTORY — DX: Basal cell carcinoma of skin, unspecified: C44.91

## 2015-02-16 HISTORY — PX: PARATHYROIDECTOMY: SHX19

## 2015-02-16 HISTORY — DX: Major depressive disorder, single episode, unspecified: F32.9

## 2015-02-16 HISTORY — DX: Anemia, unspecified: D64.9

## 2015-02-16 HISTORY — DX: Primary hyperparathyroidism: E21.0

## 2015-02-16 HISTORY — DX: Reserved for concepts with insufficient information to code with codable children: IMO0002

## 2015-02-16 HISTORY — DX: Depression, unspecified: F32.A

## 2015-02-16 HISTORY — DX: Malignant melanoma of skin, unspecified: C43.9

## 2015-02-16 LAB — CBC
HCT: 40.5 % (ref 36.0–46.0)
HEMOGLOBIN: 14.1 g/dL (ref 12.0–15.0)
MCH: 32.3 pg (ref 26.0–34.0)
MCHC: 34.8 g/dL (ref 30.0–36.0)
MCV: 92.7 fL (ref 78.0–100.0)
Platelets: 201 10*3/uL (ref 150–400)
RBC: 4.37 MIL/uL (ref 3.87–5.11)
RDW: 14.3 % (ref 11.5–15.5)
WBC: 7.9 10*3/uL (ref 4.0–10.5)

## 2015-02-16 LAB — BASIC METABOLIC PANEL
Anion gap: 10 (ref 5–15)
BUN: 13 mg/dL (ref 6–23)
CALCIUM: 10.8 mg/dL — AB (ref 8.4–10.5)
CO2: 28 mmol/L (ref 19–32)
Chloride: 103 mmol/L (ref 96–112)
Creatinine, Ser: 0.88 mg/dL (ref 0.50–1.10)
GFR calc Af Amer: 66 mL/min — ABNORMAL LOW (ref >90)
GFR, EST NON AFRICAN AMERICAN: 57 mL/min — AB (ref >90)
GLUCOSE: 124 mg/dL — AB (ref 70–99)
Potassium: 3.6 mmol/L (ref 3.5–5.1)
Sodium: 141 mmol/L (ref 135–145)

## 2015-02-16 SURGERY — PARATHYROIDECTOMY
Anesthesia: General | Site: Neck

## 2015-02-16 MED ORDER — HYDROMORPHONE HCL 1 MG/ML IJ SOLN: 1.0000 mg | INTRAMUSCULAR | Status: DC | PRN

## 2015-02-16 MED ORDER — ACETAMINOPHEN 325 MG PO TABS: 650.0000 mg | ORAL_TABLET | ORAL | Status: DC | PRN

## 2015-02-16 MED ORDER — PROPOFOL 10 MG/ML IV BOLUS
INTRAVENOUS | Status: AC
  Filled 2015-02-16: qty 20

## 2015-02-16 MED ORDER — ROCURONIUM BROMIDE 50 MG/5ML IV SOLN
INTRAVENOUS | Status: AC
  Filled 2015-02-16: qty 1

## 2015-02-16 MED ORDER — ONDANSETRON HCL 4 MG/2ML IJ SOLN: 4.0000 mg | Freq: Four times a day (QID) | INTRAMUSCULAR | Status: DC | PRN

## 2015-02-16 MED ORDER — HYDROCODONE-ACETAMINOPHEN 5-325 MG PO TABS
1.0000 | ORAL_TABLET | ORAL | Status: DC | PRN
  Administered 2015-02-16 (×2): 1 via ORAL
  Filled 2015-02-16 (×2): qty 1

## 2015-02-16 MED ORDER — MEPERIDINE HCL 25 MG/ML IJ SOLN: 6.2500 mg | INTRAMUSCULAR | Status: DC | PRN

## 2015-02-16 MED ORDER — LIDOCAINE HCL (CARDIAC) 20 MG/ML IV SOLN
INTRAVENOUS | Status: AC
  Filled 2015-02-16: qty 5

## 2015-02-16 MED ORDER — LIDOCAINE HCL (CARDIAC) 20 MG/ML IV SOLN
INTRAVENOUS | Status: DC | PRN
  Administered 2015-02-16: 80 mg via INTRATRACHEAL

## 2015-02-16 MED ORDER — IRBESARTAN 150 MG PO TABS
150.0000 mg | ORAL_TABLET | Freq: Every day | ORAL | Status: DC
  Administered 2015-02-16 – 2015-02-18 (×3): 150 mg via ORAL
  Filled 2015-02-16 (×3): qty 1

## 2015-02-16 MED ORDER — GLYCOPYRROLATE 0.2 MG/ML IJ SOLN
INTRAMUSCULAR | Status: DC | PRN
  Administered 2015-02-16: 0.4 mg via INTRAVENOUS

## 2015-02-16 MED ORDER — FENTANYL CITRATE 0.05 MG/ML IJ SOLN
INTRAMUSCULAR | Status: AC
  Filled 2015-02-16: qty 2

## 2015-02-16 MED ORDER — ONDANSETRON HCL 4 MG PO TABS: 4.0000 mg | ORAL_TABLET | Freq: Four times a day (QID) | ORAL | Status: DC | PRN

## 2015-02-16 MED ORDER — HEMOSTATIC AGENTS (NO CHARGE) OPTIME
TOPICAL | Status: DC | PRN
  Administered 2015-02-16: 1 via TOPICAL

## 2015-02-16 MED ORDER — PROPOFOL 10 MG/ML IV BOLUS
INTRAVENOUS | Status: DC | PRN
  Administered 2015-02-16: 100 mg via INTRAVENOUS

## 2015-02-16 MED ORDER — ONDANSETRON HCL 4 MG/2ML IJ SOLN
INTRAMUSCULAR | Status: DC | PRN
  Administered 2015-02-16: 4 mg via INTRAVENOUS

## 2015-02-16 MED ORDER — VERAPAMIL HCL ER 240 MG PO TBCR
120.0000 mg | EXTENDED_RELEASE_TABLET | Freq: Every day | ORAL | Status: DC
  Administered 2015-02-17: 120 mg via ORAL
  Filled 2015-02-16: qty 0.5

## 2015-02-16 MED ORDER — AMLODIPINE BESYLATE 2.5 MG PO TABS
2.5000 mg | ORAL_TABLET | Freq: Two times a day (BID) | ORAL | Status: DC
  Administered 2015-02-16 – 2015-02-18 (×4): 2.5 mg via ORAL
  Filled 2015-02-16 (×4): qty 1

## 2015-02-16 MED ORDER — LIDOCAINE HCL (CARDIAC) 20 MG/ML IV SOLN
INTRAVENOUS | Status: AC
  Filled 2015-02-16: qty 10

## 2015-02-16 MED ORDER — DOCUSATE SODIUM 100 MG PO CAPS
100.0000 mg | ORAL_CAPSULE | Freq: Two times a day (BID) | ORAL | Status: DC
  Administered 2015-02-16 – 2015-02-18 (×4): 100 mg via ORAL
  Filled 2015-02-16 (×4): qty 1

## 2015-02-16 MED ORDER — EPHEDRINE SULFATE 50 MG/ML IJ SOLN
INTRAMUSCULAR | Status: DC | PRN
  Administered 2015-02-16 (×2): 10 mg via INTRAVENOUS

## 2015-02-16 MED ORDER — WHITE PETROLATUM GEL
Status: AC
  Administered 2015-02-16: 14:00:00
  Filled 2015-02-16: qty 1

## 2015-02-16 MED ORDER — PROMETHAZINE HCL 25 MG/ML IJ SOLN: 6.2500 mg | INTRAMUSCULAR | Status: DC | PRN

## 2015-02-16 MED ORDER — NEOSTIGMINE METHYLSULFATE 10 MG/10ML IV SOLN
INTRAVENOUS | Status: DC | PRN
  Administered 2015-02-16: 5 mg via INTRAVENOUS

## 2015-02-16 MED ORDER — PHENYLEPHRINE HCL 10 MG/ML IJ SOLN
INTRAMUSCULAR | Status: DC | PRN
Start: 2015-02-16 — End: 2015-02-16
  Administered 2015-02-16: 120 ug via INTRAVENOUS
  Administered 2015-02-16 (×2): 80 ug via INTRAVENOUS

## 2015-02-16 MED ORDER — 0.9 % SODIUM CHLORIDE (POUR BTL) OPTIME
TOPICAL | Status: DC | PRN
  Administered 2015-02-16: 1000 mL

## 2015-02-16 MED ORDER — TRAVOPROST 0.004 % OP SOLN
1.0000 [drp] | Freq: Every day | OPHTHALMIC | Status: DC
  Administered 2015-02-16: 1 [drp] via OPHTHALMIC
  Filled 2015-02-16: qty 2.5

## 2015-02-16 MED ORDER — DORZOLAMIDE HCL-TIMOLOL MAL 2-0.5 % OP SOLN
1.0000 [drp] | Freq: Two times a day (BID) | OPHTHALMIC | Status: DC
  Administered 2015-02-16 – 2015-02-18 (×4): 1 [drp] via OPHTHALMIC
  Filled 2015-02-16: qty 10

## 2015-02-16 MED ORDER — ALPRAZOLAM 0.25 MG PO TABS
0.2500 mg | ORAL_TABLET | Freq: Two times a day (BID) | ORAL | Status: DC | PRN
Start: 2015-02-16 — End: 2015-02-18
  Administered 2015-02-18: 0.25 mg via ORAL
  Filled 2015-02-16: qty 1

## 2015-02-16 MED ORDER — FENTANYL CITRATE 0.05 MG/ML IJ SOLN
INTRAMUSCULAR | Status: AC
  Filled 2015-02-16: qty 5

## 2015-02-16 MED ORDER — ENSURE COMPLETE PO LIQD
237.0000 mL | Freq: Two times a day (BID) | ORAL | Status: DC
  Administered 2015-02-17 – 2015-02-18 (×4): 237 mL via ORAL

## 2015-02-16 MED ORDER — FENTANYL CITRATE 0.05 MG/ML IJ SOLN
25.0000 ug | INTRAMUSCULAR | Status: DC | PRN
  Administered 2015-02-16: 25 ug via INTRAVENOUS

## 2015-02-16 MED ORDER — BUPIVACAINE HCL (PF) 0.25 % IJ SOLN
INTRAMUSCULAR | Status: DC | PRN
  Administered 2015-02-16: 30 mL

## 2015-02-16 MED ORDER — BUPIVACAINE HCL (PF) 0.25 % IJ SOLN
INTRAMUSCULAR | Status: AC
  Filled 2015-02-16: qty 30

## 2015-02-16 MED ORDER — LACTATED RINGERS IV SOLN
INTRAVENOUS | Status: DC
  Administered 2015-02-16 (×2): via INTRAVENOUS

## 2015-02-16 MED ORDER — FENTANYL CITRATE 0.05 MG/ML IJ SOLN
INTRAMUSCULAR | Status: DC | PRN
  Administered 2015-02-16: 100 ug via INTRAVENOUS

## 2015-02-16 MED ORDER — KCL IN DEXTROSE-NACL 20-5-0.45 MEQ/L-%-% IV SOLN
INTRAVENOUS | Status: DC
  Administered 2015-02-16 – 2015-02-17 (×2): via INTRAVENOUS
  Filled 2015-02-16 (×4): qty 1000

## 2015-02-16 MED ORDER — KCL IN DEXTROSE-NACL 20-5-0.45 MEQ/L-%-% IV SOLN
INTRAVENOUS | Status: AC
  Administered 2015-02-16: 1000 mL
  Filled 2015-02-16: qty 1000

## 2015-02-16 MED ORDER — ROCURONIUM BROMIDE 100 MG/10ML IV SOLN
INTRAVENOUS | Status: DC | PRN
  Administered 2015-02-16: 35 mg via INTRAVENOUS

## 2015-02-16 SURGICAL SUPPLY — 43 items
APL SKNCLS STERI-STRIP NONHPOA (GAUZE/BANDAGES/DRESSINGS) ×1
ATTRACTOMAT 16X20 MAGNETIC DRP (DRAPES) ×3 IMPLANT
BENZOIN TINCTURE PRP APPL 2/3 (GAUZE/BANDAGES/DRESSINGS) ×3 IMPLANT
BLADE SURG 10 STRL SS (BLADE) ×3 IMPLANT
BLADE SURG 15 STRL LF DISP TIS (BLADE) ×1 IMPLANT
BLADE SURG 15 STRL SS (BLADE) ×3
CANISTER SUCTION 2500CC (MISCELLANEOUS) ×3 IMPLANT
CHLORAPREP W/TINT 10.5 ML (MISCELLANEOUS) ×3 IMPLANT
CLIP TI MEDIUM 6 (CLIP) ×3 IMPLANT
CLIP TI WIDE RED SMALL 6 (CLIP) ×3 IMPLANT
CLOSURE WOUND 1/2 X4 (GAUZE/BANDAGES/DRESSINGS) ×1
CONT SPEC 4OZ CLIKSEAL STRL BL (MISCELLANEOUS) ×3 IMPLANT
COVER SURGICAL LIGHT HANDLE (MISCELLANEOUS) ×3 IMPLANT
DRAPE PED LAPAROTOMY (DRAPES) ×3 IMPLANT
DRAPE UTILITY XL STRL (DRAPES) ×6 IMPLANT
ELECT CAUTERY BLADE 6.4 (BLADE) ×3 IMPLANT
ELECT REM PT RETURN 9FT ADLT (ELECTROSURGICAL) ×3
ELECTRODE REM PT RTRN 9FT ADLT (ELECTROSURGICAL) ×1 IMPLANT
GAUZE SPONGE 2X2 8PLY STRL LF (GAUZE/BANDAGES/DRESSINGS) ×1 IMPLANT
GAUZE SPONGE 4X4 16PLY XRAY LF (GAUZE/BANDAGES/DRESSINGS) ×3 IMPLANT
GLOVE SURG ORTHO 8.0 STRL STRW (GLOVE) ×3 IMPLANT
GLOVE SURG SIGNA 7.5 PF LTX (GLOVE) ×2 IMPLANT
GOWN STRL REUS W/ TWL LRG LVL3 (GOWN DISPOSABLE) ×2 IMPLANT
GOWN STRL REUS W/ TWL XL LVL3 (GOWN DISPOSABLE) ×1 IMPLANT
GOWN STRL REUS W/TWL LRG LVL3 (GOWN DISPOSABLE) ×6
GOWN STRL REUS W/TWL XL LVL3 (GOWN DISPOSABLE) ×6
HEMOSTAT SURGICEL 2X4 FIBR (HEMOSTASIS) ×3 IMPLANT
KIT BASIN OR (CUSTOM PROCEDURE TRAY) ×3 IMPLANT
KIT ROOM TURNOVER OR (KITS) ×3 IMPLANT
LIQUID BAND (GAUZE/BANDAGES/DRESSINGS) ×2 IMPLANT
NS IRRIG 1000ML POUR BTL (IV SOLUTION) ×3 IMPLANT
PACK SURGICAL SETUP 50X90 (CUSTOM PROCEDURE TRAY) ×3 IMPLANT
PAD ARMBOARD 7.5X6 YLW CONV (MISCELLANEOUS) ×6 IMPLANT
PENCIL BUTTON HOLSTER BLD 10FT (ELECTRODE) ×3 IMPLANT
SPONGE GAUZE 2X2 STER 10/PKG (GAUZE/BANDAGES/DRESSINGS) ×2
STRIP CLOSURE SKIN 1/2X4 (GAUZE/BANDAGES/DRESSINGS) ×2 IMPLANT
SUT MNCRL AB 4-0 PS2 18 (SUTURE) ×3 IMPLANT
SUT VIC AB 3-0 SH 18 (SUTURE) ×3 IMPLANT
SYR BULB 3OZ (MISCELLANEOUS) ×2 IMPLANT
TOWEL OR 17X24 6PK STRL BLUE (TOWEL DISPOSABLE) ×3 IMPLANT
TOWEL OR 17X26 10 PK STRL BLUE (TOWEL DISPOSABLE) ×3 IMPLANT
TUBE CONNECTING 12'X1/4 (SUCTIONS) ×1
TUBE CONNECTING 12X1/4 (SUCTIONS) ×2 IMPLANT

## 2015-02-16 NOTE — Anesthesia Preprocedure Evaluation (Addendum)
Anesthesia Evaluation  Patient identified by MRN, date of birth, ID band Patient awake    Airway Mallampati: II   Neck ROM: Full    Dental  (+) Teeth Intact, Dental Advisory Given   Pulmonary neg pulmonary ROS,  breath sounds clear to auscultation        Cardiovascular hypertension, Pt. on medications + Valvular Problems/Murmurs MVP Rhythm:Regular  EKG LBB   Neuro/Psych    GI/Hepatic negative GI ROS, Neg liver ROS,   Endo/Other  negative endocrine ROS  Renal/GU      Musculoskeletal   Abdominal (+)  Abdomen: soft.    Peds  Hematology   Anesthesia Other Findings   Reproductive/Obstetrics                         Anesthesia Physical Anesthesia Plan  ASA: II  Anesthesia Plan: General   Post-op Pain Management:    Induction: Intravenous  Airway Management Planned: Oral ETT  Additional Equipment:   Intra-op Plan:   Post-operative Plan:   Informed Consent: I have reviewed the patients History and Physical, chart, labs and discussed the procedure including the risks, benefits and alternatives for the proposed anesthesia with the patient or authorized representative who has indicated his/her understanding and acceptance.     Plan Discussed with:   Anesthesia Plan Comments: (Multimodal pain RX, check AM labs)       Anesthesia Quick Evaluation

## 2015-02-16 NOTE — Op Note (Signed)
OPERATIVE REPORT - PARATHYROIDECTOMY  Preoperative diagnosis: Primary hyperparathyroidism  Postop diagnosis: Same  Procedure: Right superior minimally invasive parathyroidectomy  Surgeon:  Earnstine Regal, MD, FACS  Assist:  Alphonsa Overall, MD, FACS  Anesthesia: Gen. endotracheal  Estimated blood loss: Minimal  Preparation: ChloraPrep  Indications: The patient is a 81 year old female who presents with a parathyroid neoplasm. Patient is referred by Dr. Delrae Rend for evaluation of primary hyperparathyroidism. Patient was diagnosed approximately 3 years ago. Calcium levels have ranged from 10.7-11.2. Intact PTH level is elevated at 84. 24-hour urine collection is normal at 117. Vitamin D level is normal at 31. Patient has bone density scan showing osteopenia. Patient underwent nuclear medicine parathyroid scan on January 26, 2015. This localizes a parathyroid adenoma to the right superior position.   Procedure: Patient was prepared in the holding area. He was brought to operating room and placed in a supine position on the operating room table. Following administration of general anesthesia, the patient was positioned and then prepped and draped in the usual strict aseptic fashion. After ascertaining that an adequate level of anesthesia been achieved, a neck incision was made with a #15 blade. Dissection was carried through subcutaneous tissues and platysma. Hemostasis was obtained with the electrocautery. Skin flaps were developed circumferentially and a Weitlander retractor was placed for exposure.  Strap muscles were incised in the midline. Strap muscles were reflected exposing the thyroid lobe. With gentle blunt dissection the thyroid lobe was mobilized.  Dissection was carried through adipose tissue and an enlarged parathyroid gland was identified. It was gently mobilized. Vascular structures were divided between small and medium ligaclips. Care was taken to avoid the recurrent  laryngeal nerve and the esophagus. The parathyroid gland was completely excised. It was submitted to pathology where frozen section confirmed parathyroid tissue consistent with adenoma.  Neck was irrigated with warm saline and good hemostasis was noted. Fibrillar was placed in the operative field. Strap muscles were reapproximated in the midline with interrupted 3-0 Vicryl sutures. Platysma was closed with interrupted 3-0 Vicryl sutures. Skin was closed with a running 4-0 Monocryl subcuticular suture. Marcaine was infiltrated circumferentially. Wound was washed and dried and benzoin and Steri-Strips were applied. Sterile gauze dressings were applied. Patient was awakened from anesthesia and brought to the recovery room. The patient tolerated the procedure well.   Earnstine Regal, MD, [facility]

## 2015-02-16 NOTE — Interval H&P Note (Signed)
History and Physical Interval Note:  02/16/2015 9:13 AM  [name]  has presented today for surgery, with the diagnosis of PRIMARY HYPERPARATHYROIDISM.  The various methods of treatment have been discussed with the patient and family. After consideration of risks, benefits and other options for treatment, the patient has consented to    Procedure(s): RIGHT SUPERIOR PARATHYROIDECTOMY (N/A) as a surgical intervention .    The patient's history has been reviewed, patient examined, no change in status, stable for surgery.  I have reviewed the patient's chart and labs.  Questions were answered to the patient's satisfaction.    Earnstine Regal, MD, [facility] Office: [place]

## 2015-02-16 NOTE — Transfer of Care (Signed)
Immediate Anesthesia Transfer of Care Note  Patient: [name]  Procedure(s) Performed: Procedure(s): RIGHT SUPERIOR PARATHYROIDECTOMY (N/A)  Patient Location: PACU  Anesthesia Type:General  Level of Consciousness: sedated and responds to stimulation  Airway & Oxygen Therapy: Patient Spontanous Breathing and Patient connected to nasal cannula oxygen  Post-op Assessment: Report given to RN and Post -op Vital signs reviewed and stable  Post vital signs: Reviewed and stable  Last Vitals:  Filed Vitals:   02/16/15 0814  BP: 166/72  Pulse: 81  Temp: 36.5 C  Resp: 1    Complications: No apparent anesthesia complications

## 2015-02-16 NOTE — Anesthesia Procedure Notes (Signed)
Procedure Name: Intubation Date/Time: 02/16/2015 10:10 AM Performed by: Julian Reil Pre-anesthesia Checklist: Patient identified, Emergency Drugs available, Suction available and Patient being monitored Patient Re-evaluated:Patient Re-evaluated prior to inductionOxygen Delivery Method: Circle system utilized Preoxygenation: Pre-oxygenation with 100% oxygen Intubation Type: IV induction Ventilation: Mask ventilation without difficulty Laryngoscope Size: Mac and 3 Grade View: Grade II Tube type: Oral Tube size: 7.0 mm Number of attempts: 1 Airway Equipment and Method: Stylet and LTA kit utilized Placement Confirmation: ETT inserted through vocal cords under direct vision,  positive ETCO2 and breath sounds checked- equal and bilateral Secured at: 21 cm Tube secured with: Tape Dental Injury: Teeth and Oropharynx as per pre-operative assessment

## 2015-02-16 NOTE — Evaluation (Signed)
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 Pt with recent progressive decline in functional mobility in the last 2 months with impaired balance, gait, and cognitive impairments. Multiple MRI's of brain completed being unremarkable. Tests pending through outpatient neurology.      Clinical Impression  Patient presents with generalized weakness s/p above surgery, incoordination of RUE/LE, ataxia, tremors, memory impairments, word finding difficulties and balance deficits impacting safe mobility.   Prior to 2 months ago, pt completely independent with all ADLs, ambulation and going to the gym a few times per week. Over the last 2 months, pt with progressive worsening of gait, balance and function requiring assist for ADLs and ambulation affecting quality of life and overall safe mobility. Pt was being followed up at outpatient neurology for new above deficits however follow up got disrupted due to need for surgery. Very concerned about pt going home as pt high falls risk and spouse having a hard time providing necessary care. Feel pt would benefit from neurology consult while in hospital as this may determine/affect disposition plans and mobility. Pt would benefit from skilled PT to improve overall functional mobility so pt can ease burden of care and maximize independence.     Follow Up Recommendations CIR;Supervision/Assistance - 24 hour    Equipment Recommendations  None recommended by PT    Recommendations for Other Services OT consult     Precautions / Restrictions Precautions Precautions: Fall Restrictions Weight Bearing Restrictions: No      Mobility  Bed Mobility Overal bed mobility: Needs Assistance Bed Mobility: Supine to Sit     Supine to sit: Mod assist;HOB  elevated     General bed mobility comments: Assist to bring BLEs to EOB and elevate trunk. Posterior lean upon sitting EOB. Increased time.  Transfers Overall transfer level: Needs assistance Equipment used: Rolling walker (2 wheeled) Transfers: Sit to/from [facility] Sit to Stand: Min assist Stand pivot transfers: Min assist       General transfer comment: Min A to rise from EOB and for SPT with manual cues for hand placement with posterior trunk lean noted. Pt anxious. Stood from [facility] x1 with Min A for balance and right foot placement. Narrow BoS.  Ambulation/Gait Ambulation/Gait assistance: Mod assist Ambulation Distance (Feet): 18 Feet Assistive device: Rolling walker (2 wheeled) Gait Pattern/deviations: Step-to pattern;Decreased stride length;Decreased step length - right;Narrow base of support;Shuffle;Festinating;Trunk flexed   Gait velocity interpretation: Below normal speed for age/gender General Gait Details: Pt with ataxic like gait with short shuffling steps. Difficulty advancing RLE. Responds well with verbal/manual cues to pick up RLE during swing phase. Assist for RW negotiation. Difficulty with turns. Impaired initiation.  Stairs            Wheelchair Mobility    Modified Rankin (Stroke Patients Only)       Balance Overall balance assessment: Needs assistance Sitting-balance support: Feet supported;No upper extremity supported Sitting balance-Leahy Scale: Poor Sitting balance - Comments: Min A to maintain sitting balance EOB due to posterior trunk lean.  Postural control: Posterior lean Standing balance support: During functional activity Standing balance-Leahy Scale: Poor Standing balance comment: Requires BUE support on RW for balance/safety.  Pertinent Vitals/Pain Pain Assessment: No/denies pain    Home Living Family/patient expects to be discharged to:: Private residence Living  Arrangements: Spouse/significant other Available Help at Discharge: Family;Personal care attendant;Available 24 hours/day Type of Home: House Home Access: Stairs to enter   [employer] of Steps: 2 Home Layout: Two level;Able to live on main level with bedroom/bathroom Home Equipment: Toilet riser;Grab bars - toilet;Walker - 4 wheels;Bedside commode      Prior Function Level of Independence: Needs assistance   Gait / Transfers Assistance Needed: Assist for ambulation and transfers with use of rollator.   ADL's / Homemaking Assistance Needed: Assist for dressing/bathing.  Comments: CNAs and spouse present with patient 24/7. Prior to 2 months ago, pt independent with all care, going to the gym and active. Recent progressive decline in function requiring use of rollator.     Hand Dominance   Dominant Hand: Right    Extremity/Trunk Assessment   Upper Extremity Assessment: Defer to OT evaluation;RUE deficits/detail RUE Deficits / Details: Intention tremor noted RUE when trying to grab cup. Spontaneous movements RUE when testing AROM BLEs. AROM WFL. Strength WFL. Dysdiadochokinesia present. Dysmetria present during FTN testing - overshooting/undershooting target. Difficulty with feeding noted.          Lower Extremity Assessment: RLE deficits/detail RLE Deficits / Details: Decreased AROM hip flexion. Discoordination of movements noted RLE. Ataxic.       Communication   Communication: Expressive difficulties (Difficulty with word finding at times - pt reports as new within last 2 months.)  Cognition Arousal/Alertness: Awake/alert Behavior During Therapy: WFL for tasks assessed/performed Overall Cognitive Status: Within Functional Limits for tasks assessed Area of Impairment: Problem solving;Memory     Memory: Decreased short-term memory       Problem Solving: Slow processing;Decreased initiation;Difficulty sequencing;Requires verbal cues;Requires tactile  cues General Comments: Pt with delayed response time to answer questions requiring repetition and increased time. Expressive difficulties.     General Comments General comments (skin integrity, edema, etc.): Discussed concern for safety at home. Pt and spouse report functional mobility at home has been challenging the last 2 months. Are agreeable to ST SNF vs CIR pending further workup.    Exercises        Assessment/Plan    PT Assessment Patient needs continued PT services  PT Diagnosis Difficulty walking;Abnormality of gait   PT Problem List Decreased coordination;Decreased range of motion;Decreased cognition;Decreased activity tolerance;Decreased balance;Decreased mobility  PT Treatment Interventions Balance training;Gait training;DME instruction;Stair training;Patient/family education;Functional mobility training;Therapeutic activities;Therapeutic exercise;Neuromuscular re-education   PT Goals (Current goals can be found in the Care Plan section) Acute Rehab PT Goals Patient Stated Goal: "to figure out whats wrong with me and return to independence" PT Goal Formulation: With patient/family Time For Goal Achievement: 03/02/15 Potential to Achieve Goals: Fair    Frequency Min 4X/week   Barriers to discharge        Co-evaluation               End of Session Equipment Utilized During Treatment: Gait belt Activity Tolerance: Patient tolerated treatment well Patient left: in chair;with call bell/phone within reach;with nursing/sitter in room Nurse Communication: Mobility status    Functional Assessment Tool Used: Clinical judgment Functional Limitation: Mobility: Walking and moving around Mobility: Walking and Moving Around Current Status [phone]): At least 40 percent but less than 60 percent impaired, limited or restricted Mobility: Walking and Moving Around Goal Status [phone]): At least 20 percent but less than 40 percent impaired, limited or restricted  Time:  0071-2197 PT Time Calculation (min) (ACUTE ONLY): 40 min   Charges:   PT Evaluation $Initial PT Evaluation Tier I: 1 Procedure PT Treatments $Gait Training: 8-22 mins $Self Care/Home Management: 8-22   PT G Codes:   PT G-Codes **NOT FOR INPATIENT CLASS** Functional Assessment Tool Used: Clinical judgment Functional Limitation: Mobility: Walking and moving around Mobility: Walking and Moving Around Current Status ([id number]): At least 40 percent but less than 60 percent impaired, limited or restricted Mobility: Walking and Moving Around Goal Status [phone]): At least 20 percent but less than 40 percent impaired, limited or restricted    Candy Sledge A 02/16/2015, 4:36 PM  Candy Sledge, [place], DPT [phone]

## 2015-02-16 NOTE — Progress Notes (Addendum)
Rehab Admissions Coordinator Note:  Patient was screened by Cleatrice Burke for appropriateness for an Inpatient Acute Rehab Consult per PT recommendation.  At this time, we are recommending Inpatient Rehab consult. Please place order so that we can assess if pt would be a candidate. Also recommend OT eval. Noted that pt is observation status, doe she meet for inpatient at this time?  Cleatrice Burke 02/16/2015, 7:08 PM  I can be reached at [phone].

## 2015-02-16 NOTE — Anesthesia Postprocedure Evaluation (Signed)
  Anesthesia Post-op Note  Patient: [name]  Procedure(s) Performed: Procedure(s): RIGHT SUPERIOR PARATHYROIDECTOMY (N/A)  Patient Location: PACU  Anesthesia Type:General  Level of Consciousness: awake and alert   Airway and Oxygen Therapy: Patient Spontanous Breathing and Patient connected to nasal cannula oxygen  Post-op Pain: mild  Post-op Assessment: Post-op Vital signs reviewed and Patient's Cardiovascular Status Stable  Post-op Vital Signs: Reviewed and stable  Last Vitals:  Filed Vitals:   02/16/15 1300  BP: 156/68  Pulse: 85  Temp: 36.3 C  Resp: 16    Complications: No apparent anesthesia complications

## 2015-02-17 ENCOUNTER — Encounter (HOSPITAL_COMMUNITY): Payer: Self-pay | Admitting: Surgery

## 2015-02-17 ENCOUNTER — Observation Stay (HOSPITAL_COMMUNITY): Payer: PPO

## 2015-02-17 DIAGNOSIS — R29898 Other symptoms and signs involving the musculoskeletal system: Secondary | ICD-10-CM

## 2015-02-17 DIAGNOSIS — I10 Essential (primary) hypertension: Secondary | ICD-10-CM

## 2015-02-17 DIAGNOSIS — R531 Weakness: Secondary | ICD-10-CM | POA: Diagnosis present

## 2015-02-17 DIAGNOSIS — D351 Benign neoplasm of parathyroid gland: Secondary | ICD-10-CM

## 2015-02-17 DIAGNOSIS — E213 Hyperparathyroidism, unspecified: Secondary | ICD-10-CM

## 2015-02-17 DIAGNOSIS — E210 Primary hyperparathyroidism: Secondary | ICD-10-CM | POA: Diagnosis not present

## 2015-02-17 LAB — BASIC METABOLIC PANEL
Anion gap: 6 (ref 5–15)
BUN: 11 mg/dL (ref 6–23)
CO2: 30 mmol/L (ref 19–32)
Calcium: 9.4 mg/dL (ref 8.4–10.5)
Chloride: 103 mmol/L (ref 96–112)
Creatinine, Ser: 0.87 mg/dL (ref 0.50–1.10)
GFR calc Af Amer: 66 mL/min — ABNORMAL LOW (ref >90)
GFR, EST NON AFRICAN AMERICAN: 57 mL/min — AB (ref >90)
GLUCOSE: 126 mg/dL — AB (ref 70–99)
POTASSIUM: 3.9 mmol/L (ref 3.5–5.1)
SODIUM: 139 mmol/L (ref 135–145)

## 2015-02-17 NOTE — Progress Notes (Signed)
UR completed 

## 2015-02-17 NOTE — Clinical Social Work Placement (Addendum)
Clinical Social Work Department CLINICAL SOCIAL WORK PLACEMENT NOTE 02/17/2015  Patient:  [name],[name]  Account Number:  [id number] Admit date:  02/16/2015  Clinical Social Worker:  Tysheena Ginzburg, LCSWA  Date/time:  02/17/2015 05:06 PM  Clinical Social Work is seeking post-discharge placement for this patient at the following level of care:   SKILLED NURSING   (*CSW will update this form in Epic as items are completed)   02/17/2015  Patient/family provided with [place] Department of Clinical Social Work's list of facilities offering this level of care within the geographic area requested by the patient (or if unable, by the patient's family).  02/17/2015  Patient/family informed of their freedom to choose among providers that offer the needed level of care, that participate in Medicare, Medicaid or managed care program needed by the patient, have an available bed and are willing to accept the patient.  02/17/2015  Patient/family informed of [facility]' ownership interest in [facility], as well as of the fact that they are under no obligation to receive care at this facility.  PASARR submitted to EDS on 02/17/2015 PASARR number received on 02/17/2015  FL2 transmitted to all facilities in geographic area requested by pt/family on  02/17/2015 FL2 transmitted to all facilities within larger geographic area on 02/17/2015  Patient informed that his/her managed care company has contracts with or will negotiate with  certain facilities, including the following:     Patient/family informed of bed offers received: 02/17/15 Patient chooses bed at [facility] Physician recommends and patient chooses bed at    Patient to be transferred to  on  [facility] on 01/20/15 Patient to be transferred to facility by [facility]  Patient and family notified of transfer on 01/20/15 Name of family member notified: Patient's husband [name]   The following physician request were  entered in Epic: Physician Request  Please sign FL2.    Additional Comments:  [name]. [place], MSW, [place] 02/18/2015 6:03 PM

## 2015-02-17 NOTE — Progress Notes (Signed)
PT Cancellation Note  Patient Details Name: RAINA SOLE MRN: [id number] DOB: 22-Jul-1923   Cancelled Treatment:    Reason Eval/Treat Not Completed: Patient at procedure or test/unavailable Pt off floor at MRI. Will follow up next available time.   Candy Sledge A 02/17/2015, 2:49 PM  Candy Sledge, [place], DPT [phone]

## 2015-02-17 NOTE — Clinical Social Work Note (Addendum)
CSW met with patient and her husband to discuss if they would like home health PT or SNF for short term rehab.  Patient and husband are on wait list for [facility], but they are not living there yet.  CSW discussed SNF options for patient and gave a list of SNF facilities.  Patient has been faxed out to SNFs for short term rehab, patient, and family will discuss if they would like home health or SNF.  Patient's husband states they have carers already coming to the home, but he could not remember the name of the company.  CSW informed case manager, CSW will continue to follow patient.  FL2 on chart to be signed by physician.  Eric R. Anterhaus, MSW, LCSWA [phone] 02/17/2015 1:57 PM  

## 2015-02-17 NOTE — Progress Notes (Signed)
Patient ID: [name], female   DOB: May 07, 1923, 81 y.o.   MRN: [id number]  [facility]  POD#: 1  Subjective: Patient stable post op.  Pain controlled.  Family at bedside.  Multiple concerns by patient and family relating to deteriorating physical status, weakness, tremor, poor appetite, constipation - all unrelated to surgical procedure.  Objective: Vital signs in last 24 hours: Temp:  [97 F (36.1 C)-98.5 F (36.9 C)] 98 F (36.7 C) (03/16 0506) Pulse Rate:  [80-94] 85 (03/16 0506) Resp:  [1-23] 18 (03/16 0506) BP: (136-166)/(64-92) 148/76 mmHg (03/16 0506) SpO2:  [94 %-100 %] 95 % (03/16 0506) Weight:  [59.421 kg (131 lb)-64.864 kg (143 lb)] 59.421 kg (131 lb) (03/15 1330) Last BM Date: 02/15/15  Intake/Output from previous day: 03/15 0701 - 03/16 0700 In: 1302 [I.V.:1302] Out: -  Intake/Output this shift:    Physical Exam: HEENT - sclerae clear, mucous membranes moist; small ecchymosis left lateral canthus Neck - wound dry and intact; mild STS; voice normal Chest - clear bilaterally Cor - RRR Ext - no edema, non-tender  Lab Results:   Recent Labs  02/16/15 0907  WBC 7.9  HGB 14.1  HCT 40.5  PLT 201   BMET  Recent Labs  02/16/15 0907  NA 141  K 3.6  CL 103  CO2 28  GLUCOSE 124*  BUN 13  CREATININE 0.88  CALCIUM 10.8*   PT/INR No results for input(s): LABPROT, INR in the last 72 hours. Comprehensive Metabolic Panel:    Component Value Date/Time   NA 141 02/16/2015 0907   NA 137 01/31/2015 1231   NA 139 06/03/2014 1336   K 3.6 02/16/2015 0907   K 3.4* 01/31/2015 1231   K 3.9 06/03/2014 1336   CL 103 02/16/2015 0907   CL 101 01/31/2015 1231   CL 96* 06/03/2014 1336   CO2 28 02/16/2015 0907   CO2 28 01/31/2015 1215   CO2 28 06/03/2014 1336   BUN 13 02/16/2015 0907   BUN 11 01/31/2015 1231   BUN 14 06/03/2014 1336   CREATININE 0.88 02/16/2015 0907   CREATININE 0.80 01/31/2015 1231   CREATININE 1.0  06/03/2014 1336   GLUCOSE 124* 02/16/2015 0907   GLUCOSE 97 01/31/2015 1231   GLUCOSE 99 06/03/2014 1336   CALCIUM 10.8* 02/16/2015 0907   CALCIUM 10.1 01/31/2015 1215   CALCIUM 9.8 06/03/2014 1336   CALCIUM TEST NOT PERFORMED 06/05/2012 0808   CALCIUM 10.6* 11/14/2011 1038   AST 20 01/31/2015 1215   AST 19 01/11/2015 1520   AST 21 06/03/2014 1336   ALT 16 01/31/2015 1215   ALT 17 01/11/2015 1520   ALT 18 06/03/2014 1336   ALKPHOS 56 01/31/2015 1215   ALKPHOS 56 01/11/2015 1520   ALKPHOS 57 06/03/2014 1336   BILITOT 0.9 01/31/2015 1215   BILITOT 0.5 01/11/2015 1520   BILITOT 0.70 06/03/2014 1336   PROT 7.2 01/31/2015 1215   PROT 7.8 01/11/2015 1520   PROT 8.0 06/03/2014 1336   ALBUMIN 3.6 01/31/2015 1215   ALBUMIN 4.3 01/11/2015 1520    Studies/Results: No results found.  Anti-infectives: Anti-infectives    Start     Dose/Rate Route Frequency Ordered Stop   02/16/15 0600  ceFAZolin (ANCEF) IVPB 2 g/50 mL premix     2 g 100 mL/hr over 30 Minutes Intravenous On call to O.R. 02/15/15 1253 02/16/15 1017      Assessment & Plans: 1. Status post parathyroidectomy  No peri-op  complications  Will check calcium level later today 2.  Multiple medical concerns, weakness  Will ask hospitalists to evaluate - possible transfer to medical service  Earnstine Regal, MD, [facility] Office: [place] 02/17/2015

## 2015-02-17 NOTE — Clinical Social Work Note (Signed)
Clinical Social Work Department BRIEF PSYCHOSOCIAL ASSESSMENT 02/17/2015  Patient:  [name],[name]     Account Number:  [id number]     Admit date:  02/16/2015  Clinical Social Worker:  Dian Queen  Date/Time:  02/17/2015 04:58 PM  Referred by:  Physician  Date Referred:  02/17/2015 Referred for  SNF Placement   Other Referral:   Interview type:  Patient Other interview type:   Family    PSYCHOSOCIAL DATA Living Status:   Admitted from facility:   Level of care:   Primary support name:  [name] Primary support relationship to patient:  SPOUSE Degree of support available:   Patient lives with her husband, and they have aides coming in at home, but husband has his own medical issues to take care of.    CURRENT CONCERNS Current Concerns  Post-Acute Placement   Other Concerns:    SOCIAL WORK ASSESSMENT / PLAN Patient is a 81 year old female who lives with her husband. Patient is alert and oriented x4 and pleasant.  Patient states she is in some pain but doing okay overall.  Patient and husband express they are on the wait list to go to [facility], but currently they live in their own home. Patient's husband expresses that he is taking care of her but he feels she needs some strengthening in order to return back home.  Patient and husband are agreeable to going to SNF for short term rehab.  Patient is recommended by PT to go to SNF for short term rehab.   Assessment/plan status:  Psychosocial Support/Ongoing Assessment of Needs Other assessment/ plan:   Information/referral to community resources:    PATIENT'S/FAMILY'S RESPONSE TO PLAN OF CARE: Patient and husband are in agreement to going to short term rehab.    [name]. [place], MSW, [place] 02/17/2015 5:04 PM

## 2015-02-17 NOTE — Consult Note (Signed)
[facility] Consultation Note                                                                                    [name], is a 80 y.o. female  MRN: [id number]   DOB - 1923-09-27  Admit Date - 02/16/2015  Outpatient Primary MD for the patient is Drema Pry, DO   Requesting physician: Dr. Catalina Antigua  Reason for consultation: Assistance with progressive right-sided weakness and gait disturbance  With History of -  Past Medical History  Diagnosis Date  . Anxiety   . Hypertension   . Glaucoma 12/31/2009  . ANXIETY 12/31/2009  . CHEST PAIN 12/08/2004  . FATIGUE 01/09/2011  . HYPERTENSION 12/31/2009  . LEFT BUNDLE BRANCH BLOCK 01/03/2010  . MALIGNANT MELANOMA, HX OF 01/03/2010  . MITRAL VALVE PROLAPSE 12/31/2009  . Raynaud's phenomenon   . Anemia     Macrocystosis  . Depression   . Hyperparathyroidism, primary   . Melanoma     "back"  . Basal cell carcinoma     "nose"  . Squamous carcinoma     RLE; chest"      Past Surgical History  Procedure Laterality Date  . Melanoma excision      "off my back"  . Parathyroidectomy Right 02/16/2015    minimally invasive  . Tonsillectomy  1934  . Breast lumpectomy Left 1950  . Basal cell carcinoma excision    . Squamous cell carcinoma excision    . Eye surgery Right 1960    "rare retina problem"    in for   No chief complaint on file.    HPI 81 year old female patient who was initially admitted to the hospital to undergo resection of parathyroid adenoma. She reports that since February 2016 she has had progressive right-sided weakness and gait instability. She has been in the process of undergoing a very thorough outpatient evaluation by her primary care physician as well as by her outpatient neurologist. MRI was unrevealing. Multiple serological studies have been obtained and the only abnormalities have been a mildly elevated rheumatoid factor of 29 and mildly elevated ESR at 39. Outpatient EMG pending this week and her  outpatient neurologist had entertained performing lumbar puncture to survey her cerebral spinal fluid to determine etiology of her current symptomatology. The patient had also presented on 2/28 to the ER for the right-sided weakness and an MRI of the brain was repeated that was negative for acute infarct.  Since admission patient has tolerated her parathyroidectomy procedure well. Family was concerned about preadmission gait disturbance and requested formal physical therapy evaluation this admission. Recommendation was for skilled PT to improve overall functional mobility and to maximize independence. OT evaluation pending. Patient has been evaluated by the [facility] physician who felt that the patient did not have medical justification for an inpatient rehabilitation stay and recommended the patient continue her outpatient neurological evaluation.  The patient and family reports to me that the patient's symptoms began in February of this year initially as depression and anxiety symptomatology. She reports prior to this she had a mild upper respiratory infection but no other acute or significant illnesses prior to onset of symptoms. Patient  was given 2 different types of antidepressant medications but was unable to tolerate these. The patient has noticed increasing right-sided weakness with tremors and gait instability since that time. Because of the degree of her illness she has now stopped driving. She reports that previously she drank a glass of wine occasionally after dinner but has stopped this. In addition to the above evaluations it was presumed that possibly her hypercalcemia was contributing to some of her neurological symptoms so her hydrochlorothiazide was discontinued and she was started on IV ReClast with improvement in her calcium levels but no improvement in her neurological symptoms.   [name]  is a 81 y.o. female,   Review of Systems   In addition to the HPI above,  No Fever-chills,  myalgias or other constitutional symptoms No Headache, changes with Vision or hearing; she reports glaucoma symptoms are stable No problems swallowing food or Liquids, indigestion/reflux No Chest pain, Cough or Shortness of Breath, palpitations, orthopnea or DOE No Abdominal pain, N/V; no melena or hematochezia, no dark tarry stools, Bowel movements are regular, No dysuria, hematuria or flank pain No new skin rashes, lesions, masses or bruises, No new joints pains-aches No recent weight gain or loss No polyuria, polydypsia or polyphagia,  *A full 10 point Review of Systems was done, except as stated above, all other Review of Systems were negative.  Social History History  Substance Use Topics  . Smoking status: Never Smoker   . Smokeless tobacco: Never Used  . Alcohol Use: 4.2 oz/week    0 Standard drinks or equivalent, 7 Glasses of wine per week    Family History Family History  Problem Relation Age of Onset  . Heart disease Mother   . Kidney disease Father   . Heart attack Sister   . Cancer Brother     lung  . Hypertension Other   . Hyperlipidemia Other   . Healthy Daughter     x2    Prior to Admission medications   Medication Sig Start Date End Date Taking? Authorizing Provider  ALPRAZolam (XANAX) 0.25 MG tablet Take 1 tablet (0.25 mg total) by mouth 2 (two) times daily as needed for anxiety. 02/01/15  Yes Doe-Hyun R Shawna Orleans, DO  amLODipine (NORVASC) 5 MG tablet Take 0.5 tablets (2.5 mg total) by mouth daily. Patient taking differently: Take 2.5 mg by mouth 2 (two) times daily.  01/22/15  Yes Doe-Hyun Kyra Searles, DO  Cholecalciferol (VITAMIN D3) 3000 UNITS TABS Take 1 capsule by mouth daily.   Yes Historical Provider, MD  dorzolamide-timolol (COSOPT) 22.3-6.8 MG/ML ophthalmic solution Place 1 drop into both eyes every 12 (twelve) hours.     Yes Historical Provider, MD  Probiotic Product (ALIGN) 4 MG CAPS Take 1 capsule by mouth daily.   Yes Historical Provider, MD  travoprost,  benzalkonium, (TRAVATAN) 0.004 % ophthalmic solution Place 1 drop into both eyes at bedtime.     Yes Historical Provider, MD  valsartan (DIOVAN) 320 MG tablet Take 320 mg by mouth daily. Take once daily.   Yes Historical Provider, MD  verapamil (CALAN-SR) 120 MG CR tablet TAKE ONE TABLET AT BEDTIME 12/23/14  Yes Doe-Hyun R Yoo, DO  vitamin B-12 (CYANOCOBALAMIN) 250 MCG tablet Take 1 tablet (250 mcg total) by mouth daily. 05/15/14  Yes Doe-Hyun R Shawna Orleans, DO  ipratropium (ATROVENT) 0.06 % nasal spray USE 2 SPRAYS INTO EACH NOSTRIL ONCE DAILY AS NEEDED FOR RUNNY NOSE SYMPTOMS    Doe-Hyun R Yoo, DO  potassium chloride (K-DUR) 10  MEQ tablet TAKE ONE TABLET BY MOUTH ONCE DAILY 12/02/14   Doe-Hyun Kyra Searles, DO  zoledronic acid (RECLAST) 5 MG/100ML SOLN injection Inject 5 mg into the vein once.    Historical Provider, MD    Allergies  Allergen Reactions  . Codeine     Physical Exam  Vitals  Blood pressure 148/76, pulse 85, temperature 98 F (36.7 C), temperature source Oral, resp. rate 18, height $RemoveBe'5\' 3"'xEijiJNYZ$  (1.6 m), weight 131 lb (59.421 kg), SpO2 95 %.   General:  In no acute distress, appears healthy and well nourished  Psych:  Normal affect, Denies Suicidal or Homicidal ideations, Awake Alert, Oriented X 3. Speech and thought patterns are clear and appropriate, no apparent short term memory deficits  Neuro:  CN II through XII intact, Strength 5/5 all 4 extremities, Sensation intact all 4 extremities. Patient noted with spontaneous bilateral myoclonic jerking worse on the right side, when tested she had slight right arm drift but also had stiffness with mobility in the right upper extremity without any associated pain or joint crepitus, she was noted to be hyper reflexive with testing of right patellar reflex  ENT:  Ears and Eyes appear Normal, Conjunctivae clear, PER. Moist oral mucosa without erythema or exudates.  Neck:  Supple, No lymphadenopathy appreciated  Respiratory:  Symmetrical chest wall  movement, Good air movement bilaterally, CTAB. Room Air  Cardiac:  RRR, No Murmurs, no LE edema noted, no JVD, No carotid bruits, peripheral pulses palpable at 2+  Abdomen:  Positive bowel sounds, Soft, Non tender, Non distended,  No masses appreciated, no obvious hepatosplenomegaly  Skin:  No Cyanosis, Normal Skin Turgor, No Skin Rash or Bruise.  Extremities: Symmetrical without obvious trauma or injury,  no effusions.  Data Review  CBC  Recent Labs Lab 02/16/15 0907  WBC 7.9  HGB 14.1  HCT 40.5  PLT 201  MCV 92.7  MCH 32.3  MCHC 34.8  RDW 14.3    Chemistries   Recent Labs Lab 02/16/15 0907 02/17/15 0625  NA 141 139  K 3.6 3.9  CL 103 103  CO2 28 30  GLUCOSE 124* 126*  BUN 13 11  CREATININE 0.88 0.87  CALCIUM 10.8* 9.4    estimated creatinine clearance is 36.3 mL/min (by C-G formula based on Cr of 0.87).  No results for input(s): TSH, T4TOTAL, T3FREE, THYROIDAB in the last 72 hours.  Invalid input(s): FREET3  Coagulation profile No results for input(s): INR, PROTIME in the last 168 hours.  No results for input(s): DDIMER in the last 72 hours.  Cardiac Enzymes No results for input(s): CKMB, TROPONINI, MYOGLOBIN in the last 168 hours.  Invalid input(s): CK  Invalid input(s): POCBNP  Urinalysis    Component Value Date/Time   COLORURINE YELLOW 01/31/2015 [place] 01/31/2015 1230   LABSPEC 1.011 01/31/2015 1230   PHURINE 7.0 01/31/2015 1230   GLUCOSEU NEGATIVE 01/31/2015 1230   GLUCOSEU NEGATIVE 01/07/2010 0809   HGBUR NEGATIVE 01/31/2015 1230   BILIRUBINUR NEGATIVE 01/31/2015 1230   BILIRUBINUR n 06/27/2012 1508   KETONESUR 15* 01/31/2015 1230   PROTEINUR NEGATIVE 01/31/2015 1230   PROTEINUR n 06/27/2012 1508   UROBILINOGEN 1.0 01/31/2015 1230   UROBILINOGEN 0.2 06/27/2012 1508   NITRITE NEGATIVE 01/31/2015 1230   NITRITE n 06/27/2012 1508   LEUKOCYTESUR NEGATIVE 01/31/2015 1230    Imaging results:   Mr Brain Wo  Contrast  01/31/2015   CLINICAL DATA:  Right-sided numbness over the last month which has increased in  the last week.  EXAM: MRI HEAD WITHOUT CONTRAST  TECHNIQUE: Multiplanar, multiecho pulse sequences of the brain and surrounding structures were obtained without intravenous contrast.  COMPARISON:  MRI of the brain 01/23/2015 at [facility].  FINDINGS: No acute infarct, hemorrhage, or mass lesion is present. Moderate periventricular and subcortical white matter changes bilaterally are stable. The ventricles are proportionate to the degree of atrophy. No significant extraaxial fluid collection is present.  Flow is present in the major intracranial arteries. The patient is status post bilateral lens replacements. The globes and orbits are otherwise intact. The paranasal sinuses and mastoid air cells are clear.  Skullbase is unremarkable. Midline structures are within normal limits.  IMPRESSION: Or  1. No acute intracranial abnormality or significant interval change. 2. Stable atrophy and diffuse white matter disease. This likely reflects the sequelae of chronic microvascular ischemia.   Electronically Signed   By: San Morelle M.D.   On: 01/31/2015 14:14   Mr Brain Wo Contrast  01/23/2015   CLINICAL DATA:  Unsteady gait 2 weeks. Ataxia. Rule out cerebellar stroke. Labile blood pressure.  EXAM: MRI HEAD WITHOUT CONTRAST  TECHNIQUE: Multiplanar, multiecho pulse sequences of the brain and surrounding structures were obtained without intravenous contrast.  COMPARISON:  None.  FINDINGS: Moderate atrophy. Prominent ventricles and subarachnoid space diffusely compatible with atrophy.  Negative for acute infarct. Mild chronic microvascular ischemic change in the white matter. Brainstem and cerebellum are normal.  Negative for intracranial hemorrhage  Negative for mass or edema.  Mild mucosal edema in the paranasal sinuses.  IMPRESSION: Generalized atrophy with mild chronic microvascular ischemia. No acute  infarct or mass lesion   Electronically Signed   By: Franchot Gallo M.D.   On: 01/23/2015 14:29   Nm Parathyroid W/spect  01/26/2015   CLINICAL DATA:  Hyperparathyroidism. Primary hyperparathyroidism. PTH equal 71  EXAM: NM PARATHYROID SCINTIGRAPHY AND SPECT IMAGING  TECHNIQUE: Following intravenous administration of radiopharmaceutical, early and 2-hour delayed planar images were obtained in the anterior projection. Delayed triplanar SPECT images were also obtained at 2 hours.  RADIOPHARMACEUTICALS:  26.4 mCiTc-64m Sestamibi IV  COMPARISON:  None.  FINDINGS: There is a focus of residual activity in the right aspect of the thyroid bed on the planar imaging. SPECT imaging demonstrates a distinct focus of radiotracer accumulation localizing to the superior aspect of the right lobe of the thyroid gland.  IMPRESSION: Focal activity localizing to the superior aspect of the RIGHT lobe of thyroid gland is most consists with parathyroid adenoma.   Electronically Signed   By: Suzy Bouchard M.D.   On: 01/26/2015 14:19    Assessment & Plan  Active Problems:   Right sided weakness -Ongoing for at least 2 months and progressive -Have discussed with neurology and the recommendation is continue outpatient evaluation; recommend completing EMG and follow-up with outpatient neurologist to determine if lumbar puncture is indicated -Since has more prominent right upper extremity symptoms will check MRI of cervical spine -Stiffness and cogwheeling with right upper extremity concerning for early Parkinson's disease but will defer additional workup to this and discussion with the family regarding this possibility to her outpatient neurologist -Continue physical therapy and occupational therapy; since not a candidate for inpatient treatments for this have asked case manager to assist with arranging home health physical therapy -Last B12 level was checked July 2015 so we'll repeat an anemia panel to determine if B12  deficiency contributing to symptoms; zinc level was mildly diminished during outpatient evaluation and patient had been started  on supplementation prior to admission    Essential hypertension -Continue Norvasc, verapamil and Avapro; blood pressure currently controlled    Hyperparathyroidism/status post resection of Parathyroid adenoma -Per primary/surgical team -Calcium level currently remains stable    Family Communication:   Multiple family members at bedside with patient's permission   Condition:  Stable  Time spent in minutes : 60   [name],ALLISON L. ANP on 02/17/2015 at 9:45 AM  Between 7am to 7pm - Pager - [phone]  After 7pm go to [email or web address] - password [email or web address]  And look for the night coverage person covering me after hours  [facility]

## 2015-02-17 NOTE — Progress Notes (Signed)
Physical Therapy Treatment Patient Details Name: [name] MRN: [id number] DOB: 09-Oct-1923 Today's Date: 02/17/2015    History of Present Illness Patient is a 81 y/o female s/p right superior parathyroidectomy secondary to a parathyroid neoplasm. Pt with recent progressive decline in functional mobility in the last 2 months with impaired balance, gait, and cognitive impairments. Multiple MRI's of brain completed being unremarkable. Tests pending through outpatient neurology.      PT Comments    Patient progressing slowly with mobility. Able to increase step length, stride length and advance RLE with appropriate verbal cues demonstrating more fluid like gait. Continues to exhibit ataxia and myotonic jerking of RUE spontaneously. Pt demonstrates some symptoms consistent with PD? Highly motivated to improve functional mobility and maximize independence. Discharge recommendation updated to [facility] as pt observation status and does not quality for CIR. Family agreeable.   Follow Up Recommendations  SNF;Supervision/Assistance - 24 hour     Equipment Recommendations  None recommended by PT    Recommendations for Other Services       Precautions / Restrictions Precautions Precautions: Fall Restrictions Weight Bearing Restrictions: No    Mobility  Bed Mobility Overal bed mobility: Needs Assistance Bed Mobility: Rolling;Sidelying to Sit Rolling: Min assist Sidelying to sit: Min assist Supine to sit: Max assist Sit to supine: Max assist   General bed mobility comments: Rolling to right/left to change sheets/pad with Min A. Cues for log roll technique. Min A to elevate trunk and for hand placement. Posterior lean upon sitting EOB, however able to self correct with verbal/tactile cues.  Transfers Overall transfer level: Needs assistance Equipment used: Rolling walker (2 wheeled) Transfers: Sit to/from Stand Sit to Stand: Min assist Stand pivot transfers: Min assist;Mod assist       General transfer comment: Min A to rise from chair with step by step cues for hand placement, technique and anterior translation. Cues to widen BoS and for upright (pt leans left). Stood from chair x3.  Ambulation/Gait Ambulation/Gait assistance: Min assist Ambulation Distance (Feet): 40 Feet (x2 bouts) Assistive device: Rolling walker (2 wheeled) Gait Pattern/deviations: Step-through pattern;Narrow base of support;Ataxic;Trunk flexed;Drifts right/left   Gait velocity interpretation: Below normal speed for age/gender General Gait Details: Pt with ataxic like gait pattern, able to increase step length and advance RLE with verbal cues to count out loud "Right..left etc." Occasional veering right and MIn A to redirect RW.    Stairs            Wheelchair Mobility    Modified Rankin (Stroke Patients Only)       Balance Overall balance assessment: Needs assistance Sitting-balance support: Feet supported;No upper extremity supported Sitting balance-Leahy Scale: Poor Sitting balance - Comments: Min A to maintain sitting balance EOB due to posterior trunk lean without UE support progressing to Min guard for static sitting balance with UE support. Postural control: Posterior lean Standing balance support: During functional activity Standing balance-Leahy Scale: Poor                      Cognition Arousal/Alertness: Awake/alert Behavior During Therapy: Anxious Overall Cognitive Status:  (unsure of baseline) Area of Impairment: Problem solving;Memory     Memory: Decreased short-term memory       Problem Solving: Decreased initiation;Difficulty sequencing;Slow processing;Requires verbal cues      Exercises      General Comments        Pertinent Vitals/Pain Pain Assessment: No/denies pain    Home Living Family/patient expects to  be discharged to:: Unsure Living Arrangements: Spouse/significant other Available Help at Discharge: Family;Personal care  attendant;Available 24 hours/day Type of Home: House Home Access: Stairs to enter Entrance Stairs-Rails: None Home Layout: Two level;Able to live on main level with bedroom/bathroom Home Equipment: Toilet riser;Grab bars - toilet;Walker - 4 wheels;Bedside commode      Prior Function Level of Independence: Needs assistance  Gait / Transfers Assistance Needed: Assist for ambulation and transfers with use of rollator.  ADL's / Homemaking Assistance Needed: Assist for dressing/bathing. Comments: CNAs and spouse present with patient 24/7. Prior to 2 months ago, pt independent with all care, going to the gym and active. Recent progressive decline in function requiring use of rollator.   PT Goals (current goals can now be found in the care plan section) Acute Rehab PT Goals Patient Stated Goal: not stated Progress towards PT goals: Progressing toward goals    Frequency  Min 3X/week    PT Plan Current plan remains appropriate;Discharge plan needs to be updated;Frequency needs to be updated    Co-evaluation             End of Session Equipment Utilized During Treatment: Gait belt Activity Tolerance: Patient tolerated treatment well Patient left: in chair;with call bell/phone within reach;with family/visitor present     Time: 7903-8333 PT Time Calculation (min) (ACUTE ONLY): 38 min  Charges:  $Gait Training: 23-37 mins $Therapeutic Exercise: 8-22 mins                    G CodesCandy [name] A Mar 05, 2015, 4:24 PM  [name], [place], DPT [phone]

## 2015-02-17 NOTE — Progress Notes (Signed)
Thank you for consult on Ms. [name]. Note that patient is on observation status and current issues with mobility is being worked up by Neurology on outpatient basis. She does not have medical justification for inpatient rehab stay and will defer consult for now.

## 2015-02-17 NOTE — Progress Notes (Signed)
INITIAL NUTRITION ASSESSMENT  DOCUMENTATION CODES Per approved criteria  -Severe malnutrition in the context of acute illness or injury   Pt meets criteria for severe MALNUTRITION in the context of acute illness as evidenced by 12% wt loss x 1 month, <50% estimated energy intake <5 days.  INTERVENTION: -Continue Ensure Enlive po BID, each supplement provides 350 kcal and 20 grams of protein  NUTRITION DIAGNOSIS: Inadequate oral intake related to decreased appetite as evidenced by 12% wt loss x 1 month.   Goal: Pt will meet >90% of estimated nutritional needs  Monitor:  PO/supplement intake, labs, weight changes, I/O's  Reason for Assessment: MST=3  81 y.o. female  Admitting Dx: <principal problem not specified>  The patient is a 81 year old female who presents with a parathyroid neoplasm. Patient is referred by Dr. Delrae Rend for evaluation of primary hyperparathyroidism. Patient was diagnosed approximately 3 years ago. Calcium levels have ranged from 10.7-11.2. Intact PTH level is elevated at 84. 24-hour urine collection is normal at 117. Vitamin D level is normal at 31. Patient has bone density scan showing osteopenia. Patient underwent nuclear medicine parathyroid scan on January 26, 2015. This localizes a parathyroid adenoma to the right superior position.  ASSESSMENT: S/p Procedure on 02/16/15:  Right superior minimally invasive parathyroidectomy  Pt down for procedure and family unavailable at time of visit. Nutrition-focused physical exam deferred at this time.  Chart review indicates pt with general decline in health for the past 2 months PTA. Pt has had a poor appetite. Noted a 12% wt loss x 1 month, which is significant. Spoke with RN who reports that pt needed assistance with feeding and set-up today. Suspect poor intake given hx. Pt has Ensure already ordered- RD will continue order.  Labs reviewed. Glucose: 126.   Height: Ht Readings from Last 1  Encounters:  02/16/15 5\' 3"  (1.6 m)    Weight: Wt Readings from Last 1 Encounters:  02/16/15 131 lb (59.421 kg)    Ideal Body Weight: 115#  % Ideal Body Weight: 114%  Wt Readings from Last 10 Encounters:  02/16/15 131 lb (59.421 kg)  02/04/15 143 lb 2 oz (64.921 kg)  01/20/15 149 lb (67.586 kg)  12/23/14 152 lb (68.947 kg)  12/07/14 154 lb 12.8 oz (70.217 kg)  09/30/14 157 lb (71.215 kg)  06/03/14 151 lb (68.493 kg)  05/15/14 152 lb (68.947 kg)  04/13/14 152 lb (68.947 kg)  10/10/13 152 lb (68.947 kg)    Usual Body Weight: 152#  % Usual Body Weight: 86%  BMI:  Body mass index is 23.21 kg/(m^2). Normal weight ranges  Estimated Nutritional Needs: Kcal: 1500-1700 Protein: 70-80 grams Fluid: 1.5-1.7 L  Skin: closed surgical incision on rt neck  Diet Order: DIET SOFT  EDUCATION NEEDS: -Education not appropriate at this time   Intake/Output Summary (Last 24 hours) at 02/17/15 1409 Last data filed at 02/16/15 2200  Gross per 24 hour  Intake    302 ml  Output      0 ml  Net    302 ml    Last BM: 02/15/15  Labs:   Recent Labs Lab 02/16/15 0907 02/17/15 0625  NA 141 139  K 3.6 3.9  CL 103 103  CO2 28 30  BUN 13 11  CREATININE 0.88 0.87  CALCIUM 10.8* 9.4  GLUCOSE 124* 126*    CBG (last 3)  No results for input(s): GLUCAP in the last 72 hours.  Scheduled Meds: . amLODipine  2.5 mg Oral  BID  . docusate sodium  100 mg Oral BID  . dorzolamide-timolol  1 drop Both Eyes Q12H  . feeding supplement (ENSURE COMPLETE)  237 mL Oral BID BM  . irbesartan  150 mg Oral Daily  . travoprost (benzalkonium)  1 drop Both Eyes QHS  . verapamil  120 mg Oral QHS    Continuous Infusions: . dextrose 5 % and 0.45 % NaCl with KCl 20 mEq/L 50 mL/hr at 02/17/15 0900    Past Medical History  Diagnosis Date  . Anxiety   . Hypertension   . Glaucoma 12/31/2009  . ANXIETY 12/31/2009  . CHEST PAIN 12/08/2004  . FATIGUE 01/09/2011  . HYPERTENSION 12/31/2009  . LEFT BUNDLE  BRANCH BLOCK 01/03/2010  . MALIGNANT MELANOMA, HX OF 01/03/2010  . MITRAL VALVE PROLAPSE 12/31/2009  . Raynaud's phenomenon   . Anemia     Macrocystosis  . Depression   . Hyperparathyroidism, primary   . Melanoma     "back"  . Basal cell carcinoma     "nose"  . Squamous carcinoma     RLE; chest"    Past Surgical History  Procedure Laterality Date  . Melanoma excision      "off my back"  . Parathyroidectomy Right 02/16/2015    minimally invasive  . Tonsillectomy  1934  . Breast lumpectomy Left 1950  . Basal cell carcinoma excision    . Squamous cell carcinoma excision    . Eye surgery Right 1960    "rare retina problem"    Drayton Tieu A. Jimmye Norman, RD, LDN, CDE Pager: [phone] After hours Pager: [phone]

## 2015-02-17 NOTE — Evaluation (Addendum)
Occupational Therapy Evaluation Patient Details Name: [name] MRN: [id number] DOB: [birth year] Today's Date: 02/17/2015    History of Present Illness Patient is a 81 y/o female s/p right superior parathyroidectomy secondary to a parathyroid neoplasm. Pt with recent progressive decline in functional mobility in the last 2 months with impaired balance, gait, and cognitive impairments. Multiple MRI's of brain completed being unremarkable. Tests pending through outpatient neurology.     Clinical Impression   Pt admitted with above. Pt requiring assist for ADLs for last 2 months. Feel pt will benefit from acute OT to incrase independence and strength prior to d/c. Recommend pt go to CIR for rehab, but pt is observation and according to note she does not have medical justification for CIR, so recommend SNF for rehab. Discussed both these options with pt and her spouse.    Follow Up Recommendations  SNF;Supervision/Assistance - 24 hour    Equipment Recommendations  Other (comment) (defer to next venue)    Recommendations for Other Services       Precautions / Restrictions Precautions Precautions: Fall Restrictions Weight Bearing Restrictions: No      Mobility Bed Mobility Overal bed mobility: Needs Assistance Bed Mobility: Supine to Sit;Sit to Supine     Supine to sit: Max assist Sit to supine: Max assist   General bed mobility comments: Cues for technqiue and assist given at trunk and hips to go from supine to sit.  +2 assist to scoot HOB-cues for technique.  Transfers Overall transfer level: Needs assistance Equipment used: Rolling walker (2 wheeled) Transfers: Sit to/from [employer] Sit to Stand: Mod assist;Max assist Stand pivot transfers: Min assist;Mod assist       General transfer comment: cues for technique. assist to boost to come to standing.    Balance   Min-Mod A for ambulation/stand pivot transfers. Pt with posterior lean sitting EOB,  but then improved.                                           ADL Overall ADL's : Needs assistance/impaired                     Lower Body Dressing: Maximal assistance;Sit to/from stand   Toilet Transfer: Minimal assistance;Moderate assistance;Ambulation;Stand-pivot;RW;BSC   Toileting- Clothing Manipulation and Hygiene: Total assistance;Sit to/from stand       Functional mobility during ADLs: Minimal assistance;Moderate assistance;Rolling walker General ADL Comments: Discussed d/c options. Pt with decreased coordination with Rt side of body making mobility/ADLs difficult.     Vision     Perception     Praxis      Pertinent Vitals/Pain Pain Assessment: No/denies pain (no pain at end of session or at beginning, however when returning to supine seemed to have neck pain-OT repositioned her)     Hand Dominance Right   Extremity/Trunk Assessment Upper Extremity Assessment Upper Extremity Assessment: RUE deficits/detail;Generalized weakness RUE Deficits / Details: tremors in RUE; 3-/5 shoulder flexion RUE Coordination: decreased fine motor;decreased gross motor   Lower Extremity Assessment Lower Extremity Assessment: Defer to PT evaluation       Communication Communication Communication: Expressive difficulties   Cognition Arousal/Alertness: Awake/alert Behavior During Therapy: Anxious Overall Cognitive Status:  (unsure of baseline) Area of Impairment: Problem solving             Problem Solving: Slow processing;Requires verbal cues;Requires tactile cues  General Comments       Exercises       Shoulder Instructions      Home Living Family/patient expects to be discharged to:: Unsure Living Arrangements: Spouse/significant other Available Help at Discharge: Family;Personal care attendant;Available 24 hours/day Type of Home: House Home Access: Stairs to enter [employer] of Steps: 2 Entrance Stairs-Rails:  None Home Layout: Two level;Able to live on main level with bedroom/bathroom     Bathroom Shower/Tub: Walk-in shower;Door   Bathroom Toilet: Handicapped height Bathroom Accessibility: No   Home Equipment: Toilet riser;Grab bars - toilet;Walker - 4 wheels;Bedside commode          Prior Functioning/Environment Level of Independence: Needs assistance  Gait / Transfers Assistance Needed: Assist for ambulation and transfers with use of rollator.  ADL's / Homemaking Assistance Needed: Assist for dressing/bathing.   Comments: CNAs and spouse present with patient 24/7. Prior to 2 months ago, pt independent with all care, going to the gym and active. Recent progressive decline in function requiring use of rollator.    OT Diagnosis: Generalized weakness;Other (comment) (decreased coordination)   OT Problem List: Decreased strength;Decreased coordination;Decreased cognition;Decreased knowledge of use of DME or AE;Decreased knowledge of precautions;Pain;Decreased activity tolerance;Impaired balance (sitting and/or standing)   OT Treatment/Interventions: Self-care/ADL training;Therapeutic exercise;DME and/or AE instruction;Therapeutic activities;Patient/family education;Balance training;Cognitive remediation/compensation    OT Goals(Current goals can be found in the care plan section) Acute Rehab OT Goals Patient Stated Goal: not stated OT Goal Formulation: With patient Time For Goal Achievement: 02/24/15 Potential to Achieve Goals: Fair ADL Goals Pt Will Perform Grooming: sitting;with set-up;with supervision Pt Will Perform Upper Body Dressing: sitting;with set-up;with supervision Pt Will Transfer to Toilet: with min assist;ambulating (to bathroom) Pt Will Perform Toileting - Clothing Manipulation and hygiene: with min assist;sitting/lateral leans  OT Frequency: Min 2X/week   Barriers to D/C:            Co-evaluation              End of Session Equipment Utilized During  Treatment: Gait belt;Rolling walker  Activity Tolerance: Patient tolerated treatment well Patient left: in bed;with call bell/phone within reach;with family/visitor present;Other (comment) (with nursing tech)   Time: [phone] OT Time Calculation (min): 18 min Charges:  OT General Charges $OT Visit: 1 Procedure OT Evaluation $Initial OT Evaluation Tier I: 1 Procedure G-Codes: OT G-codes **NOT FOR INPATIENT CLASS** Functional Assessment Tool Used: clinical judgment Functional Limitation: Self care Self Care Current Status (P5300): At least 40 percent but less than 60 percent impaired, limited or restricted Self Care Goal Status (F1102): At least 20 percent but less than 40 percent impaired, limited or restricted  Benito Mccreedy OTR/L [phone] 02/17/2015, 2:21 PM

## 2015-02-18 DIAGNOSIS — E210 Primary hyperparathyroidism: Secondary | ICD-10-CM | POA: Diagnosis not present

## 2015-02-18 DIAGNOSIS — E43 Unspecified severe protein-calorie malnutrition: Secondary | ICD-10-CM | POA: Insufficient documentation

## 2015-02-18 DIAGNOSIS — K5900 Constipation, unspecified: Secondary | ICD-10-CM

## 2015-02-18 DIAGNOSIS — E876 Hypokalemia: Secondary | ICD-10-CM

## 2015-02-18 DIAGNOSIS — M6289 Other specified disorders of muscle: Secondary | ICD-10-CM

## 2015-02-18 LAB — RETICULOCYTES
RBC.: 4.53 MIL/uL (ref 3.87–5.11)
RETIC COUNT ABSOLUTE: 40.8 10*3/uL (ref 19.0–186.0)
Retic Ct Pct: 0.9 % (ref 0.4–3.1)

## 2015-02-18 LAB — BASIC METABOLIC PANEL
Anion gap: 9 (ref 5–15)
BUN: 9 mg/dL (ref 6–23)
CALCIUM: 9.2 mg/dL (ref 8.4–10.5)
CO2: 28 mmol/L (ref 19–32)
CREATININE: 0.7 mg/dL (ref 0.50–1.10)
Chloride: 102 mmol/L (ref 96–112)
GFR calc Af Amer: 86 mL/min — ABNORMAL LOW (ref >90)
GFR calc non Af Amer: 75 mL/min — ABNORMAL LOW (ref >90)
Glucose, Bld: 102 mg/dL — ABNORMAL HIGH (ref 70–99)
Potassium: 2.8 mmol/L — ABNORMAL LOW (ref 3.5–5.1)
Sodium: 139 mmol/L (ref 135–145)

## 2015-02-18 LAB — VITAMIN B12: VITAMIN B 12: 602 pg/mL (ref 211–911)

## 2015-02-18 LAB — IRON AND TIBC
IRON: 54 ug/dL (ref 42–145)
SATURATION RATIOS: 26 % (ref 20–55)
TIBC: 210 ug/dL — AB (ref 250–470)
UIBC: 156 ug/dL (ref 125–400)

## 2015-02-18 LAB — FOLATE: Folate: 15 ng/mL

## 2015-02-18 LAB — FERRITIN: FERRITIN: 440 ng/mL — AB (ref 10–291)

## 2015-02-18 MED ORDER — MEGESTROL ACETATE 40 MG PO TABS: 400.0000 mg | ORAL_TABLET | Freq: Two times a day (BID) | ORAL | Status: DC

## 2015-02-18 MED ORDER — POTASSIUM CHLORIDE CRYS ER 20 MEQ PO TBCR
40.0000 meq | EXTENDED_RELEASE_TABLET | Freq: Once | ORAL | Status: AC
  Administered 2015-02-18: 40 meq via ORAL
  Filled 2015-02-18: qty 2

## 2015-02-18 MED ORDER — SENNOSIDES-DOCUSATE SODIUM 8.6-50 MG PO TABS
2.0000 | ORAL_TABLET | Freq: Two times a day (BID) | ORAL | Status: DC
  Administered 2015-02-18: 2 via ORAL
  Filled 2015-02-18: qty 2

## 2015-02-18 MED ORDER — CLONAZEPAM 0.5 MG PO TABS
0.2500 mg | ORAL_TABLET | Freq: Once | ORAL | Status: AC
  Administered 2015-02-18: 0.25 mg via ORAL
  Filled 2015-02-18: qty 1

## 2015-02-18 MED ORDER — MEGESTROL ACETATE 40 MG PO TABS
400.0000 mg | ORAL_TABLET | Freq: Two times a day (BID) | ORAL | Status: DC
  Filled 2015-02-18 (×2): qty 10

## 2015-02-18 MED ORDER — MEGESTROL ACETATE 400 MG/10ML PO SUSP
400.0000 mg | Freq: Two times a day (BID) | ORAL | Status: DC
  Filled 2015-02-18: qty 10

## 2015-02-18 MED ORDER — POLYETHYLENE GLYCOL 3350 17 G PO PACK: 17.0000 g | PACK | Freq: Every day | ORAL | Status: DC

## 2015-02-18 MED ORDER — POLYETHYLENE GLYCOL 3350 17 G PO PACK
17.0000 g | PACK | Freq: Every day | ORAL | Status: DC
  Administered 2015-02-18: 17 g via ORAL
  Filled 2015-02-18: qty 1

## 2015-02-18 MED ORDER — SENNOSIDES-DOCUSATE SODIUM 8.6-50 MG PO TABS: 2.0000 | ORAL_TABLET | Freq: Two times a day (BID) | ORAL | Status: DC

## 2015-02-18 MED ORDER — MINERAL OIL RE ENEM
1.0000 | ENEMA | Freq: Once | RECTAL | Status: AC
  Administered 2015-02-18: 1 via RECTAL
  Filled 2015-02-18: qty 1

## 2015-02-18 MED ORDER — POTASSIUM CHLORIDE ER 10 MEQ PO TBCR: 20.0000 meq | EXTENDED_RELEASE_TABLET | Freq: Every day | ORAL | Status: DC

## 2015-02-18 NOTE — Discharge Summary (Signed)
Physician Discharge Summary [facility]  Patient ID: [name] MRN: [id number] DOB/AGE: May 25, 1923 81 y.o.  Admit date: 02/16/2015 Discharge date: 02/18/2015  Admission Diagnoses:  Primary hyperparathyroidism  Discharge Diagnoses:  Active Problems:   Essential hypertension   Hyperparathyroidism   Parathyroid adenoma   Right sided weakness   Right arm weakness   Discharged Condition: stable  Hospital Course: Patient was admitted for observation following thyroid surgery.  Post op course was uncomplicated.  Pain was well controlled.  Tolerated diet.  Post op calcium level on morning following surgery was 9.4 mg/dl.  Patient was prepared for discharge home on POD#2.  Patient with weakness, poor appetite.  Seen by medical service.  Ongoing neurologic workup.  Seen in consult by PT and OT and Rehab.  Patient to be admitted to [facility] for rehab today.  Consults: rehabilitation medicine, case management, PT, OT  Treatments: surgery: parathyroidectomy  Discharge Exam: Blood pressure 132/84, pulse 82, temperature 98.4 F (36.9 C), temperature source Oral, resp. rate 16, height 5\' 3"  (1.6 m), weight 59.421 kg (131 lb), SpO2 98 %. HEENT - clear Neck - wound dry and intact; mild ecchymosis and STS; voice normal Chest - clear bilaterally Cor - RRR  Disposition: Rehab  Discharge Instructions    Diet - low sodium heart healthy    Complete by:  As directed      Discharge instructions    Complete by:  As directed   [facility]  THYROID & PARATHYROID SURGERY:  POST-OP INSTRUCTIONS  Always review your discharge instruction sheet from the facility where your surgery was performed.  A prescription for pain medication may be given to you upon discharge.  Take your pain medication as prescribed.  If narcotic pain medicine is not needed, then you may take acetaminophen (Tylenol) or ibuprofen (Advil) as needed.  Take your usually prescribed  medications unless otherwise directed.  If you need a refill on your pain medication, please contact your pharmacy. They will contact our office to request authorization.  Prescriptions will not be processed after 5 pm or on weekends.  Start with a light diet upon arrival home, such as soup and crackers or toast.  Be sure to drink plenty of fluids daily.  Resume your normal diet the day after surgery.  Most patients will experience some swelling and bruising on the chest and neck area.  Ice packs will help.  Swelling and bruising can take several days to resolve.   It is common to experience some constipation after surgery.  Increasing fluid intake and taking a stool softener will usually help or prevent this problem.  A mild laxative (Milk of Magnesia or Miralax) should be taken according to package directions if there has been no bowel movement after 48 hours.  If you have steri-strips and a gauze dressing over your incision, you may remove the gauze bandage on the second day after surgery, and you may shower at that time.  Leave your steri-strips (small skin tapes) in place directly over the incision.  These strips should remain on the skin for 7-10 days and then be removed.  You may get them wet in the shower and pat them dry.  If you have Dermabond (topical glue) over your incision, you may shower at any time following your procedure.  Leave the glue in place for 10-14 days.  When it begins to flake off around the edges, you may remove it.  You may resume regular (light) daily  activities beginning the next day-such as daily self-care, walking, climbing stairs-gradually increasing activities as tolerated.  You may have sexual intercourse when it is comfortable.  Refrain from any heavy lifting or straining until approved by your doctor.  You may drive when you no longer are taking prescription pain medication, you can comfortably wear a seatbelt, and you can safely maneuver your car and apply  brakes.  You should see your doctor in the office for a follow-up appointment approximately two to three weeks after your surgery.  Make sure that you call for this appointment within a day or two after you arrive home to insure a convenient appointment time.  WHEN TO CALL YOUR DOCTOR: -- Fever greater than 101.5 -- Inability to urinate -- Nausea and/or vomiting - persistent -- Extreme swelling or bruising -- Continued bleeding from incision -- Increased pain, redness, or drainage from the incision -- Difficulty swallowing or breathing -- Muscle cramping or spasms -- Numbness or tingling in hands or around lips  The clinic staff is available to answer your questions during regular business hours.  Please don't hesitate to call and ask to speak to one of the nurses if you have concerns.  Earnstine Regal, MD, [facility] Office: [phone]  Website: [email or web address]     Increase activity slowly    Complete by:  As directed      No dressing needed    Complete by:  As directed             Medication List    TAKE these medications        ALIGN 4 MG Caps  Take 1 capsule by mouth daily.     ALPRAZolam 0.25 MG tablet  Commonly known as:  XANAX  Take 1 tablet (0.25 mg total) by mouth 2 (two) times daily as needed for anxiety.     amLODipine 5 MG tablet  Commonly known as:  NORVASC  Take 0.5 tablets (2.5 mg total) by mouth daily.     dorzolamide-timolol 22.3-6.8 MG/ML ophthalmic solution  Commonly known as:  COSOPT  Place 1 drop into both eyes every 12 (twelve) hours.     ipratropium 0.06 % nasal spray  Commonly known as:  ATROVENT  USE 2 SPRAYS INTO EACH NOSTRIL ONCE DAILY AS NEEDED FOR RUNNY NOSE SYMPTOMS     potassium chloride 10 MEQ tablet  Commonly known as:  K-DUR  TAKE ONE TABLET BY MOUTH ONCE DAILY     travoprost (benzalkonium) 0.004 % ophthalmic solution  Commonly known as:  TRAVATAN  Place  1 drop into both eyes at bedtime.     valsartan 320 MG tablet  Commonly known as:  DIOVAN  Take 320 mg by mouth daily. Take once daily.     verapamil 120 MG CR tablet  Commonly known as:  CALAN-SR  TAKE ONE TABLET AT BEDTIME     vitamin B-12 250 MCG tablet  Commonly known as:  CYANOCOBALAMIN  Take 1 tablet (250 mcg total) by mouth daily.     Vitamin D3 3000 UNITS Tabs  Take 1 capsule by mouth daily.     zoledronic acid 5 MG/100ML Soln injection  Commonly known as:  RECLAST  Inject 5 mg into the vein once.         Earnstine Regal, MD, [facility] Office: [phone]   Signed: Earnstine Regal 02/18/2015, 11:42 AM

## 2015-02-18 NOTE — Progress Notes (Signed)
CONSULT PROGRESS NOTE  [name] IFO:[id number] DOB: July 01, 1923 DOA: 02/16/2015 PCP: Drema Pry, DO  HPI/Recap of past 24 hours: Frail elderly female, laying in bed, NAD, husband at bedside.  Assessment/Plan: Active Problems:   Essential hypertension   Hyperparathyroidism   Parathyroid adenoma   Right sided weakness   Right arm weakness  Right sided weakness -Ongoing for at least 2 months and progressive -Have discussed with neurology and the recommendation is continue outpatient evaluation; recommend completing EMG and follow-up with outpatient neurologist to determine if lumbar puncture is indicated -MRI of cervical spine -Continue physical therapy and occupational therapy; SNF -outpatient follow up with neurology Dr. Narda Amber in one week.   Essential hypertension -Continue Norvasc, verapamil and Avapro; blood pressure currently controlled   Hyperparathyroidism/status post resection of Parathyroid adenoma -Per primary/surgical team -Calcium level currently remains stable   Constipation, stool softener  Hypokalemia, s/p k supplement, continue k supplement , PMD to repeat bmp in one week.  Poor appetite, started megace.  Code Status: full  Family Communication: patient and husband  Disposition Plan: SNF    Objective: BP 132/84 mmHg  Pulse 82  Temp(Src) 98.4 F (36.9 C) (Oral)  Resp 16  Ht 5\' 3"  (1.6 m)  Wt 59.421 kg (131 lb)  BMI 23.21 kg/m2  SpO2 98%  Intake/Output Summary (Last 24 hours) at 02/18/15 1314 Last data filed at 02/18/15 1000  Gross per 24 hour  Intake   1334 ml  Output    450 ml  Net    884 ml   Filed Weights   02/16/15 0814 02/16/15 1330  Weight: 64.864 kg (143 lb) 59.421 kg (131 lb)    Exam:   General:  AAox3, frail  Neck: s/p parathyroidectomy, wound dry and intact.  Cardiovascular: RRR  Respiratory: CTABL  Abdomen: soft/NT/ND, positive bowel sounds  Musculoskeletal: right sided weakness. Chronic. No edema.     Data Reviewed: Basic Metabolic Panel:  Recent Labs Lab 02/16/15 0907 02/17/15 0625 02/18/15 0618  NA 141 139 139  K 3.6 3.9 2.8*  CL 103 103 102  CO2 28 30 28   GLUCOSE 124* 126* 102*  BUN 13 11 9   CREATININE 0.88 0.87 0.70  CALCIUM 10.8* 9.4 9.2   Liver Function Tests: No results for input(s): AST, ALT, ALKPHOS, BILITOT, PROT, ALBUMIN in the last 168 hours. No results for input(s): LIPASE, AMYLASE in the last 168 hours. No results for input(s): AMMONIA in the last 168 hours. CBC:  Recent Labs Lab 02/16/15 0907  WBC 7.9  HGB 14.1  HCT 40.5  MCV 92.7  PLT 201   Cardiac Enzymes:   No results for input(s): CKTOTAL, CKMB, CKMBINDEX, TROPONINI in the last 168 hours. BNP (last 3 results) No results for input(s): BNP in the last 8760 hours.  ProBNP (last 3 results) No results for input(s): PROBNP in the last 8760 hours.  CBG: No results for input(s): GLUCAP in the last 168 hours.  No results found for this or any previous visit (from the past 240 hour(s)).   Studies: Mr Cervical Spine Wo Contrast  02/17/2015   CLINICAL DATA:  Right arm weakness following recent parathyroidectomy.  EXAM: MRI CERVICAL SPINE WITHOUT CONTRAST  TECHNIQUE: Multiplanar, multisequence MR imaging of the cervical spine was performed. No intravenous contrast was administered.  COMPARISON:  None.  FINDINGS: Images are mildly to moderately degraded by motion artifact, particularly axial images. This limits detailed evaluation of degenerative changes as well as of the soft tissue changes in  the lower right neck.  There is slight reversal of the normal cervical lordosis. There is no listhesis. Vertebral body heights are preserved. Vertebral bone marrow signal is mildly to moderately heterogeneous diffusely. No vertebral marrow edema is identified. A 1 cm hemangioma is noted in the C7 vertebral body. 8 mm lesion partially visualized in the T3 vertebral body also likely represents a hemangioma.  There is  mild-to-moderate disc space narrowing at C4-5 and C5-6. Craniocervical junction is unremarkable. Cervical spinal cord is normal in caliber without definite signal abnormality identified. Anterior neck soft tissue edema and more focal signal changes about/posterior to the right thyroid lobe likely reflect postoperative changes from recent parathyroidectomy, possibly with a small underlying hematoma.  C2-3: Mild bilateral facet arthrosis without disc herniation or stenosis.  C3-4: Shallow disc bulging and mild uncovertebral spurring may result in minimal left neural foraminal narrowing without spinal stenosis.  C4-5: Broad-based posterior disc osteophyte complex results in mild-to-moderate bilateral neural foraminal stenosis without spinal stenosis.  C5-6: Broad-based posterior disc osteophyte complex results in likely mild right and moderate left neural foraminal stenosis without spinal stenosis.  C6-7:  No disc herniation or significant stenosis identified.  C7-T1:  Negative.  IMPRESSION: 1. Motion degraded examination delete that. Multilevel cervical disc degeneration with mild-to-moderate neural foraminal stenosis as above. No spinal stenosis. 2. Soft tissue changes in the lower neck consistent with recent parathyroidectomy.   Electronically Signed   By: Logan Bores   On: 02/17/2015 15:19    Scheduled Meds: . amLODipine  2.5 mg Oral BID  . dorzolamide-timolol  1 drop Both Eyes Q12H  . feeding supplement (ENSURE COMPLETE)  237 mL Oral BID BM  . irbesartan  150 mg Oral Daily  . megestrol  400 mg Oral BID  . polyethylene glycol  17 g Oral Daily  . potassium chloride  40 mEq Oral Once  . senna-docusate  2 tablet Oral BID  . travoprost (benzalkonium)  1 drop Both Eyes QHS  . verapamil  120 mg Oral QHS    Continuous Infusions: . dextrose 5 % and 0.45 % NaCl with KCl 20 mEq/L 50 mL/hr at 02/17/15 0900       [name]  [facility] Pager [phone]. If 7PM-7AM, please contact night-coverage at  [email or web address], password [email or web address] 02/18/2015, 1:14 PM

## 2015-02-18 NOTE — Progress Notes (Signed)
Report was given to [place] at [facility].

## 2015-02-18 NOTE — Progress Notes (Signed)
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 Pt with recent progressive decline in functional mobility in the last 2 months with impaired balance, gait, and cognitive impairments. Multiple MRI's of brain completed being unremarkable. Tests pending through outpatient neurology.      PT Comments    Patient progressing slowly with mobility. Continues to exhibit ataxic like gait with impaired initiation and some freezing episodes. Requires increased repetition and cueing to perform tasks. Easily distracted with too much stimulation during tx. Pt demonstrates symptoms consistent with PD however per family, this has been ruled out by MD. Will continue to follow per current POC. Pt to be d/ced to SNF today.   Follow Up Recommendations  SNF;Supervision/Assistance - 24 hour     Equipment Recommendations  None recommended by PT    Recommendations for Other Services       Precautions / Restrictions Precautions Precautions: Fall Restrictions Weight Bearing Restrictions: No    Mobility  Bed Mobility               General bed mobility comments: Sitting in recliner upon PT arrival.   Transfers Overall transfer level: Needs assistance Equipment used: Rolling walker (2 wheeled) Transfers: Sit to/from Stand Sit to Stand: Min assist         General transfer comment: Min A to rise from chair with step by step cues for hand placement, technique and anterior translation. Cues to widen BoS and for upright (pt leans left). Stood from chair x3.  Ambulation/Gait Ambulation/Gait assistance: Min assist Ambulation Distance (Feet): 50 Feet (x2 bouts) Assistive device: Rolling walker (2 wheeled) Gait Pattern/deviations: Step-through pattern;Ataxic;Narrow base of support;Trunk flexed;Drifts  right/left   Gait velocity interpretation: Below normal speed for age/gender General Gait Details: Pt with ataxic like gait pattern, able to increase step length and advance RLE with verbal cues to count out loud "Right..left etc." Occasional veering right and MIn A to redirect RW. Freezing episodes noted today. Impaired initiation.   Stairs            Wheelchair Mobility    Modified Rankin (Stroke Patients Only)       Balance Overall balance assessment: Needs assistance Sitting-balance support: Feet supported;No upper extremity supported Sitting balance-Leahy Scale: Poor Sitting balance - Comments: Requires UE support to maintain sitting balance without back support.   Standing balance support: During functional activity Standing balance-Leahy Scale: Poor                      Cognition Arousal/Alertness: Awake/alert Behavior During Therapy: Anxious Overall Cognitive Status: Impaired/Different from baseline Area of Impairment: Problem solving;Memory     Memory: Decreased recall of precautions       Problem Solving: Slow processing;Decreased initiation;Difficulty sequencing;Requires verbal cues;Requires tactile cues      Exercises      General Comments        Pertinent Vitals/Pain Pain Assessment: No/denies pain    Home Living                      Prior Function            PT Goals (current goals can now be found in the care plan section) Progress towards PT goals: Progressing toward goals    Frequency  Min 3X/week    PT Plan  Current plan remains appropriate    Co-evaluation             End of Session Equipment Utilized During Treatment: Gait belt Activity Tolerance: Patient tolerated treatment well Patient left: in chair;with call bell/phone within reach;with family/visitor present     Time: 7195-9747 PT Time Calculation (min) (ACUTE ONLY): 36 min  Charges:  $Gait Training: 8-22 mins $Therapeutic Activity: 8-22  mins                    G CodesCandy [name] A 03-13-15, 1:08 PM  [name], [place], DPT [phone]

## 2015-02-18 NOTE — Clinical Social Work Note (Signed)
Patient to be d/c'ed today to [facility].  Patient and family agreeable to plans will transport via ems RN to call report.  Evette Cristal, MSW, [place]

## 2015-02-19 ENCOUNTER — Encounter: Payer: Self-pay | Admitting: Internal Medicine

## 2015-02-19 DIAGNOSIS — R270 Ataxia, unspecified: Secondary | ICD-10-CM

## 2015-02-22 ENCOUNTER — Inpatient Hospital Stay (HOSPITAL_COMMUNITY)
Admission: EM | Admit: 2015-02-22 | Discharge: 2015-02-26 | DRG: 071 | Disposition: A | Discharge status: Hospice | Payer: PPO | Attending: Internal Medicine | Admitting: Internal Medicine

## 2015-02-22 ENCOUNTER — Ambulatory Visit: Payer: PPO | Admitting: Neurology

## 2015-02-22 ENCOUNTER — Emergency Department (HOSPITAL_COMMUNITY): Payer: PPO

## 2015-02-22 ENCOUNTER — Encounter (HOSPITAL_COMMUNITY): Payer: Self-pay | Admitting: Family Medicine

## 2015-02-22 DIAGNOSIS — G8191 Hemiplegia, unspecified affecting right dominant side: Secondary | ICD-10-CM | POA: Diagnosis present

## 2015-02-22 DIAGNOSIS — G609 Hereditary and idiopathic neuropathy, unspecified: Secondary | ICD-10-CM

## 2015-02-22 DIAGNOSIS — R627 Adult failure to thrive: Secondary | ICD-10-CM | POA: Diagnosis present

## 2015-02-22 DIAGNOSIS — R001 Bradycardia, unspecified: Secondary | ICD-10-CM | POA: Insufficient documentation

## 2015-02-22 DIAGNOSIS — F329 Major depressive disorder, single episode, unspecified: Secondary | ICD-10-CM | POA: Diagnosis present

## 2015-02-22 DIAGNOSIS — I2510 Atherosclerotic heart disease of native coronary artery without angina pectoris: Secondary | ICD-10-CM | POA: Diagnosis present

## 2015-02-22 DIAGNOSIS — R251 Tremor, unspecified: Secondary | ICD-10-CM | POA: Diagnosis present

## 2015-02-22 DIAGNOSIS — E860 Dehydration: Secondary | ICD-10-CM | POA: Diagnosis present

## 2015-02-22 DIAGNOSIS — R0600 Dyspnea, unspecified: Secondary | ICD-10-CM | POA: Insufficient documentation

## 2015-02-22 DIAGNOSIS — Z7401 Bed confinement status: Secondary | ICD-10-CM

## 2015-02-22 DIAGNOSIS — R4182 Altered mental status, unspecified: Secondary | ICD-10-CM | POA: Diagnosis present

## 2015-02-22 DIAGNOSIS — Z79899 Other long term (current) drug therapy: Secondary | ICD-10-CM

## 2015-02-22 DIAGNOSIS — R1310 Dysphagia, unspecified: Secondary | ICD-10-CM | POA: Diagnosis present

## 2015-02-22 DIAGNOSIS — R52 Pain, unspecified: Secondary | ICD-10-CM | POA: Diagnosis not present

## 2015-02-22 DIAGNOSIS — I248 Other forms of acute ischemic heart disease: Secondary | ICD-10-CM | POA: Diagnosis present

## 2015-02-22 DIAGNOSIS — I447 Left bundle-branch block, unspecified: Secondary | ICD-10-CM | POA: Diagnosis present

## 2015-02-22 DIAGNOSIS — Z515 Encounter for palliative care: Secondary | ICD-10-CM | POA: Diagnosis not present

## 2015-02-22 DIAGNOSIS — G9340 Encephalopathy, unspecified: Principal | ICD-10-CM | POA: Diagnosis present

## 2015-02-22 DIAGNOSIS — R451 Restlessness and agitation: Secondary | ICD-10-CM | POA: Insufficient documentation

## 2015-02-22 DIAGNOSIS — R7989 Other specified abnormal findings of blood chemistry: Secondary | ICD-10-CM | POA: Diagnosis not present

## 2015-02-22 DIAGNOSIS — R54 Age-related physical debility: Secondary | ICD-10-CM | POA: Diagnosis present

## 2015-02-22 DIAGNOSIS — I10 Essential (primary) hypertension: Secondary | ICD-10-CM | POA: Diagnosis present

## 2015-02-22 DIAGNOSIS — R278 Other lack of coordination: Secondary | ICD-10-CM

## 2015-02-22 DIAGNOSIS — R531 Weakness: Secondary | ICD-10-CM

## 2015-02-22 DIAGNOSIS — I639 Cerebral infarction, unspecified: Secondary | ICD-10-CM | POA: Diagnosis not present

## 2015-02-22 DIAGNOSIS — I480 Paroxysmal atrial fibrillation: Secondary | ICD-10-CM | POA: Diagnosis present

## 2015-02-22 DIAGNOSIS — M6289 Other specified disorders of muscle: Secondary | ICD-10-CM | POA: Diagnosis not present

## 2015-02-22 DIAGNOSIS — Z66 Do not resuscitate: Secondary | ICD-10-CM | POA: Diagnosis not present

## 2015-02-22 DIAGNOSIS — H409 Unspecified glaucoma: Secondary | ICD-10-CM | POA: Diagnosis present

## 2015-02-22 DIAGNOSIS — Z85820 Personal history of malignant melanoma of skin: Secondary | ICD-10-CM | POA: Diagnosis not present

## 2015-02-22 DIAGNOSIS — I4891 Unspecified atrial fibrillation: Secondary | ICD-10-CM | POA: Diagnosis not present

## 2015-02-22 DIAGNOSIS — F419 Anxiety disorder, unspecified: Secondary | ICD-10-CM | POA: Diagnosis present

## 2015-02-22 HISTORY — DX: Atherosclerotic heart disease of native coronary artery without angina pectoris: I25.10

## 2015-02-22 LAB — I-STAT CG4 LACTIC ACID, ED: Lactic Acid, Venous: 1.71 mmol/L (ref 0.5–2.0)

## 2015-02-22 LAB — COMPREHENSIVE METABOLIC PANEL
ALBUMIN: 3.6 g/dL (ref 3.5–5.2)
ALT: 20 U/L (ref 0–35)
ANION GAP: 12 (ref 5–15)
AST: 27 U/L (ref 0–37)
Alkaline Phosphatase: 57 U/L (ref 39–117)
BILIRUBIN TOTAL: 1.5 mg/dL — AB (ref 0.3–1.2)
BUN: 32 mg/dL — ABNORMAL HIGH (ref 6–23)
CHLORIDE: 105 mmol/L (ref 96–112)
CO2: 23 mmol/L (ref 19–32)
Calcium: 10.3 mg/dL (ref 8.4–10.5)
Creatinine, Ser: 1.14 mg/dL — ABNORMAL HIGH (ref 0.50–1.10)
GFR calc non Af Amer: 41 mL/min — ABNORMAL LOW (ref >90)
GFR, EST AFRICAN AMERICAN: 48 mL/min — AB (ref >90)
Glucose, Bld: 116 mg/dL — ABNORMAL HIGH (ref 70–99)
POTASSIUM: 3.8 mmol/L (ref 3.5–5.1)
SODIUM: 140 mmol/L (ref 135–145)
TOTAL PROTEIN: 7.3 g/dL (ref 6.0–8.3)

## 2015-02-22 LAB — CBC WITH DIFFERENTIAL/PLATELET
Basophils Absolute: 0 10*3/uL (ref 0.0–0.1)
Basophils Relative: 0 % (ref 0–1)
EOS PCT: 1 % (ref 0–5)
Eosinophils Absolute: 0.1 10*3/uL (ref 0.0–0.7)
HCT: 40.5 % (ref 36.0–46.0)
Hemoglobin: 14.1 g/dL (ref 12.0–15.0)
LYMPHS PCT: 12 % (ref 12–46)
Lymphs Abs: 1.4 10*3/uL (ref 0.7–4.0)
MCH: 33.3 pg (ref 26.0–34.0)
MCHC: 34.8 g/dL (ref 30.0–36.0)
MCV: 95.7 fL (ref 78.0–100.0)
Monocytes Absolute: 1.1 10*3/uL — ABNORMAL HIGH (ref 0.1–1.0)
Monocytes Relative: 9 % (ref 3–12)
NEUTROS ABS: 9.2 10*3/uL — AB (ref 1.7–7.7)
Neutrophils Relative %: 78 % — ABNORMAL HIGH (ref 43–77)
PLATELETS: 198 10*3/uL (ref 150–400)
RBC: 4.23 MIL/uL (ref 3.87–5.11)
RDW: 14.3 % (ref 11.5–15.5)
WBC: 11.8 10*3/uL — AB (ref 4.0–10.5)

## 2015-02-22 LAB — URINALYSIS, ROUTINE W REFLEX MICROSCOPIC
Glucose, UA: NEGATIVE mg/dL
HGB URINE DIPSTICK: NEGATIVE
Ketones, ur: 15 mg/dL — AB
LEUKOCYTES UA: NEGATIVE
NITRITE: NEGATIVE
PROTEIN: 30 mg/dL — AB
Specific Gravity, Urine: 1.023 (ref 1.005–1.030)
UROBILINOGEN UA: 0.2 mg/dL (ref 0.0–1.0)
pH: 5.5 (ref 5.0–8.0)

## 2015-02-22 LAB — URINE MICROSCOPIC-ADD ON

## 2015-02-22 MED ORDER — TRAVOPROST (BAK FREE) 0.004 % OP SOLN
1.0000 [drp] | Freq: Every day | OPHTHALMIC | Status: DC
Start: 2015-02-22 — End: 2015-02-26
  Administered 2015-02-23 – 2015-02-25 (×3): 1 [drp] via OPHTHALMIC
  Filled 2015-02-22 (×2): qty 2.5

## 2015-02-22 MED ORDER — SODIUM CHLORIDE 0.9 % IV BOLUS (SEPSIS)
500.0000 mL | Freq: Once | INTRAVENOUS | Status: AC
  Administered 2015-02-22: 500 mL via INTRAVENOUS

## 2015-02-22 MED ORDER — POTASSIUM CHLORIDE ER 10 MEQ PO TBCR
10.0000 meq | EXTENDED_RELEASE_TABLET | Freq: Every day | ORAL | Status: DC
  Administered 2015-02-23: 10 meq via ORAL
  Filled 2015-02-22 (×5): qty 1

## 2015-02-22 MED ORDER — VITAMIN B-12 100 MCG PO TABS
250.0000 ug | ORAL_TABLET | Freq: Every day | ORAL | Status: DC
  Filled 2015-02-22: qty 3

## 2015-02-22 MED ORDER — ACYCLOVIR SODIUM 50 MG/ML IV SOLN
10.0000 mg/kg | Freq: Once | INTRAVENOUS | Status: AC
  Administered 2015-02-23: 595 mg via INTRAVENOUS
  Filled 2015-02-22: qty 11.9

## 2015-02-22 MED ORDER — VERAPAMIL HCL ER 180 MG PO TBCR
180.0000 mg | EXTENDED_RELEASE_TABLET | Freq: Two times a day (BID) | ORAL | Status: DC
  Administered 2015-02-23: 180 mg via ORAL
  Filled 2015-02-22 (×3): qty 1

## 2015-02-22 MED ORDER — DEXTROSE 5 % IV SOLN
600.0000 mg | Freq: Three times a day (TID) | INTRAVENOUS | Status: DC
  Administered 2015-02-23 – 2015-02-25 (×7): 600 mg via INTRAVENOUS
  Filled 2015-02-22 (×9): qty 12

## 2015-02-22 MED ORDER — DORZOLAMIDE HCL-TIMOLOL MAL 2-0.5 % OP SOLN
1.0000 [drp] | Freq: Two times a day (BID) | OPHTHALMIC | Status: DC
  Administered 2015-02-23 – 2015-02-26 (×7): 1 [drp] via OPHTHALMIC
  Filled 2015-02-22: qty 10

## 2015-02-22 NOTE — ED Notes (Signed)
Pt presents from Neurologist (Dr. Posey Pronto with [facility]) office with c/o declining mental status x1 week. Pt's husband at bedside and reports patient had parathyroid surgery approx 1 week ago and has been "declining" since then.  Pt is normally alert, sitting up, and able to converse. Pt is currently slow to respond verbally, difficult to understand, but does follow commands.  Pt also has a stiffened arms and husband notes that patient was able to ambulate with walking two weeks ago and has not been able to do so in the last week.  Husband also notes lack of appetite.

## 2015-02-22 NOTE — Progress Notes (Signed)
Patient arrived to 4N15 from procedure in IR. RN instructed from transporter that patient is to lay flat for 4 hours. VSS. Patient confused but oriented to room and unit. Droplet precautions in place. Will continue to monitor patient closely. Burnell Blanks, RN

## 2015-02-22 NOTE — ED Notes (Signed)
Pt. Up in IR

## 2015-02-22 NOTE — ED Notes (Signed)
Neurology at bedside.

## 2015-02-22 NOTE — H&P (Signed)
[facility]                                                                                    Patient Demographics  [name], is a 81 y.o. female  CSN: [id number]  MRN: [id number]  DOB - 1923-05-03  Admit Date - 02/22/2015  Outpatient Primary MD for the patient is Drema Pry, DO   With History of -  Past Medical History  Diagnosis Date  . Anxiety   . Hypertension   . Glaucoma 12/31/2009  . ANXIETY 12/31/2009  . CHEST PAIN 12/08/2004  . FATIGUE 01/09/2011  . HYPERTENSION 12/31/2009  . LEFT BUNDLE BRANCH BLOCK 01/03/2010  . MALIGNANT MELANOMA, HX OF 01/03/2010  . MITRAL VALVE PROLAPSE 12/31/2009  . Raynaud's phenomenon   . Anemia     Macrocystosis  . Depression   . Hyperparathyroidism, primary   . Melanoma     "back"  . Basal cell carcinoma     "nose"  . Squamous carcinoma     RLE; chest"      Past Surgical History  Procedure Laterality Date  . Melanoma excision      "off my back"  . Parathyroidectomy Right 02/16/2015    minimally invasive  . Tonsillectomy  1934  . Breast lumpectomy Left 1950  . Basal cell carcinoma excision    . Squamous cell carcinoma excision    . Eye surgery Right 1960    "rare retina problem"  . Parathyroidectomy N/A 02/16/2015    Procedure: RIGHT SUPERIOR PARATHYROIDECTOMY;  Surgeon: Armandina Gemma, MD;  Location: [facility];  Service: General;  Laterality: N/A;    in for   Chief Complaint  Patient presents with  . Altered Mental Status     HPI  [name]  is a 81 y.o. female, with past medical history significant for hypertension, left bundle branch block and recent parathyroidectomy on 02/16/2015, presenting today with worsening mental status while at the nursing home. The patient was admitted a last month with right-sided weakness and change in mental status, seen by neurology with negative workup. She had a history of depression and was started on Zoloft then taken off. Patient saw Dr. Posey Pronto today who noted worsening  mental status in the last few days with the right side neglect, nystagmus and intermittent clonus. There was concern about viral meningitis and patient was sent to IR for LP today and CT of the head was negative. Patient could not give any significant history . Patient had 2 MRIs in 2 /20 and 2/28 that did not show any acute changes.    Review of Systems    Difficult to obtain    Social History History  Substance Use Topics  . Smoking status: Never Smoker   . Smokeless tobacco: Never Used  . Alcohol Use: 4.2 oz/week    0 Standard drinks or equivalent, 7 Glasses of wine per week     Family History Family History  Problem Relation Age of Onset  . Heart disease Mother   . Kidney disease Father   . Heart attack Sister   . Cancer Brother     lung  . Hypertension Other   .  Hyperlipidemia Other   . Healthy Daughter     x2     Prior to Admission medications   Medication Sig Start Date End Date Taking? Authorizing Provider  cholecalciferol (VITAMIN D) 1000 UNITS tablet Take 3,000 Units by mouth daily.   Yes Historical Provider, MD  dorzolamide-timolol (COSOPT) 22.3-6.8 MG/ML ophthalmic solution Place 1 drop into both eyes every 12 (twelve) hours.     Yes Historical Provider, MD  ALPRAZolam (XANAX) 0.25 MG tablet Take 1 tablet (0.25 mg total) by mouth 2 (two) times daily as needed for anxiety. 02/01/15   Doe-Hyun R Shawna Orleans, DO  amLODipine (NORVASC) 5 MG tablet Take 0.5 tablets (2.5 mg total) by mouth daily. Patient taking differently: Take 2.5 mg by mouth 2 (two) times daily.  01/22/15   Doe-Hyun R Shawna Orleans, DO  ipratropium (ATROVENT) 0.06 % nasal spray USE 2 SPRAYS INTO EACH NOSTRIL ONCE DAILY AS NEEDED FOR RUNNY NOSE SYMPTOMS    Doe-Hyun R Yoo, DO  megestrol (MEGACE) 40 MG tablet Take 10 tablets (400 mg total) by mouth 2 (two) times daily. Patient not taking: Reported on 02/22/2015 02/18/15   Florencia Reasons, MD  polyethylene glycol [facility] / Floria Raveling) packet Take 17 g by mouth daily. Patient not  taking: Reported on 02/22/2015 02/18/15   Florencia Reasons, MD  potassium chloride (K-DUR) 10 MEQ tablet TAKE ONE TABLET BY MOUTH ONCE DAILY 12/02/14   Doe-Hyun Kyra Searles, DO  Probiotic Product (ALIGN) 4 MG CAPS Take 1 capsule by mouth daily.    Historical Provider, MD  senna-docusate (SENOKOT-S) 8.6-50 MG per tablet Take 2 tablets by mouth 2 (two) times daily. Patient not taking: Reported on 02/22/2015 02/18/15   Florencia Reasons, MD  travoprost, benzalkonium, (TRAVATAN) 0.004 % ophthalmic solution Place 1 drop into both eyes at bedtime.      Historical Provider, MD  valsartan (DIOVAN) 320 MG tablet Take 320 mg by mouth daily. Take once daily.    Historical Provider, MD  verapamil (CALAN-SR) 120 MG CR tablet TAKE ONE TABLET AT BEDTIME 12/23/14   Doe-Hyun R Shawna Orleans, DO  vitamin B-12 (CYANOCOBALAMIN) 250 MCG tablet Take 1 tablet (250 mcg total) by mouth daily. 05/15/14   Doe-Hyun Kyra Searles, DO    Allergies  Allergen Reactions  . Codeine Other (See Comments)    Unk    Physical Exam  Vitals  Blood pressure 153/91, pulse 107, temperature 97.3 F (36.3 C), temperature source Oral, resp. rate 18, SpO2 99 %.   1. General well-developed female looks confused  2. Flat affect and confusion.  3.. Right arm weakness noted  4. Ears and Eyes appear Normal, Conjunctivae clear, with dry Oral Mucosa.  5. Supple Neck, No JVD, No cervical lymphadenopathy appriciated, No Carotid Bruits.  6. Symmetrical Chest wall movement, Good air movement bilaterally, CTAB.  7. Irregular, No Gallops, Rubs or Murmurs, No Parasternal Heave.  8. Positive Bowel Sounds, Abdomen Soft, Non tender, No organomegaly appriciated,No rebound -guarding or rigidity.  9.  No Cyanosis, Normal Skin Turgor, facial rosacea.  10. Normal range of motion with increased tone,.    Data Review  CBC  Recent Labs Lab 02/16/15 0907 02/22/15 1658  WBC 7.9 11.8*  HGB 14.1 14.1  HCT 40.5 40.5  PLT 201 198  MCV 92.7 95.7  MCH 32.3 33.3  MCHC 34.8 34.8  RDW  14.3 14.3  LYMPHSABS  --  1.4  MONOABS  --  1.1*  EOSABS  --  0.1  BASOSABS  --  0.0   ------------------------------------------------------------------------------------------------------------------  Chemistries   Recent Labs Lab 02/16/15 0907 02/17/15 0625 02/18/15 0618 02/22/15 1658  NA 141 139 139 140  K 3.6 3.9 2.8* 3.8  CL 103 103 102 105  CO2 28 30 28 23   GLUCOSE 124* 126* 102* 116*  BUN 13 11 9  32*  CREATININE 0.88 0.87 0.70 1.14*  CALCIUM 10.8* 9.4 9.2 10.3  AST  --   --   --  27  ALT  --   --   --  20  ALKPHOS  --   --   --  57  BILITOT  --   --   --  1.5*   ------------------------------------------------------------------------------------------------------------------ estimated creatinine clearance is 27.7 mL/min (by C-G formula based on Cr of 1.14). ------------------------------------------------------------------------------------------------------------------ No results for input(s): TSH, T4TOTAL, T3FREE, THYROIDAB in the last 72 hours.  Invalid input(s): FREET3   Coagulation profile No results for input(s): INR, PROTIME in the last 168 hours. ------------------------------------------------------------------------------------------------------------------- No results for input(s): DDIMER in the last 72 hours. -------------------------------------------------------------------------------------------------------------------  Cardiac Enzymes No results for input(s): CKMB, TROPONINI, MYOGLOBIN in the last 168 hours.  Invalid input(s): CK ------------------------------------------------------------------------------------------------------------------ Invalid input(s): POCBNP   ---------------------------------------------------------------------------------------------------------------  Urinalysis    Component Value Date/Time   COLORURINE AMBER* 02/22/2015 1910   APPEARANCEUR CLEAR 02/22/2015 1910   LABSPEC 1.023 02/22/2015 1910   PHURINE  5.5 02/22/2015 1910   GLUCOSEU NEGATIVE 02/22/2015 1910   GLUCOSEU NEGATIVE 01/07/2010 0809   HGBUR NEGATIVE 02/22/2015 1910   BILIRUBINUR SMALL* 02/22/2015 1910   BILIRUBINUR n 06/27/2012 1508   KETONESUR 15* 02/22/2015 1910   PROTEINUR 30* 02/22/2015 1910   PROTEINUR n 06/27/2012 1508   UROBILINOGEN 0.2 02/22/2015 1910   UROBILINOGEN 0.2 06/27/2012 1508   NITRITE NEGATIVE 02/22/2015 1910   NITRITE n 06/27/2012 1508   LEUKOCYTESUR NEGATIVE 02/22/2015 1910    ----------------------------------------------------------------------------------------------------------------     Imaging results:   Dg Chest 2 View  02/22/2015   CLINICAL DATA:  Altered mental status, recent parathyroid surgery  EXAM: CHEST  2 VIEW  COMPARISON:  01/04/2009  FINDINGS: Lungs are clear.  No pleural effusion or pneumothorax.  The heart is normal in size.  Degenerative changes of the visualized thoracolumbar spine.  IMPRESSION: No evidence of acute cardiopulmonary disease.   Electronically Signed   By: Julian Hy M.D.   On: 02/22/2015 17:59   Ct Head Wo Contrast  02/22/2015   CLINICAL DATA:  Altered mental status, recent parathyroid surgery  EXAM: CT HEAD WITHOUT CONTRAST  TECHNIQUE: Contiguous axial images were obtained from the base of the skull through the vertex without intravenous contrast.  COMPARISON:  MRI brain dated 01/23/2015  FINDINGS: No evidence of parenchymal hemorrhage or extra-axial fluid collection. No mass lesion, mass effect, or midline shift.  No CT evidence of acute infarction.  Subcortical white matter and periventricular small vessel ischemic changes.  Cerebral volume is within normal limits.  No ventriculomegaly.  The visualized paranasal sinuses are essentially clear. The mastoid air cells are unopacified.  No evidence of calvarial fracture.  IMPRESSION: No evidence of acute intracranial abnormality.  Atrophy with small vessel ischemic changes.   Electronically Signed   By: Julian Hy M.D.   On: 02/22/2015 18:21   Mr Brain Wo Contrast  01/31/2015   CLINICAL DATA:  Right-sided numbness over the last month which has increased in the last week.  EXAM: MRI HEAD WITHOUT CONTRAST  TECHNIQUE: Multiplanar, multiecho pulse sequences of the brain and surrounding structures were obtained without intravenous contrast.  COMPARISON:  MRI  of the brain 01/23/2015 at [facility].  FINDINGS: No acute infarct, hemorrhage, or mass lesion is present. Moderate periventricular and subcortical white matter changes bilaterally are stable. The ventricles are proportionate to the degree of atrophy. No significant extraaxial fluid collection is present.  Flow is present in the major intracranial arteries. The patient is status post bilateral lens replacements. The globes and orbits are otherwise intact. The paranasal sinuses and mastoid air cells are clear.  Skullbase is unremarkable. Midline structures are within normal limits.  IMPRESSION: Or  1. No acute intracranial abnormality or significant interval change. 2. Stable atrophy and diffuse white matter disease. This likely reflects the sequelae of chronic microvascular ischemia.   Electronically Signed   By: San Morelle M.D.   On: 01/31/2015 14:14   Mr Cervical Spine Wo Contrast  02/17/2015   CLINICAL DATA:  Right arm weakness following recent parathyroidectomy.  EXAM: MRI CERVICAL SPINE WITHOUT CONTRAST  TECHNIQUE: Multiplanar, multisequence MR imaging of the cervical spine was performed. No intravenous contrast was administered.  COMPARISON:  None.  FINDINGS: Images are mildly to moderately degraded by motion artifact, particularly axial images. This limits detailed evaluation of degenerative changes as well as of the soft tissue changes in the lower right neck.  There is slight reversal of the normal cervical lordosis. There is no listhesis. Vertebral body heights are preserved. Vertebral bone marrow signal is mildly to moderately  heterogeneous diffusely. No vertebral marrow edema is identified. A 1 cm hemangioma is noted in the C7 vertebral body. 8 mm lesion partially visualized in the T3 vertebral body also likely represents a hemangioma.  There is mild-to-moderate disc space narrowing at C4-5 and C5-6. Craniocervical junction is unremarkable. Cervical spinal cord is normal in caliber without definite signal abnormality identified. Anterior neck soft tissue edema and more focal signal changes about/posterior to the right thyroid lobe likely reflect postoperative changes from recent parathyroidectomy, possibly with a small underlying hematoma.  C2-3: Mild bilateral facet arthrosis without disc herniation or stenosis.  C3-4: Shallow disc bulging and mild uncovertebral spurring may result in minimal left neural foraminal narrowing without spinal stenosis.  C4-5: Broad-based posterior disc osteophyte complex results in mild-to-moderate bilateral neural foraminal stenosis without spinal stenosis.  C5-6: Broad-based posterior disc osteophyte complex results in likely mild right and moderate left neural foraminal stenosis without spinal stenosis.  C6-7:  No disc herniation or significant stenosis identified.  C7-T1:  Negative.  IMPRESSION: 1. Motion degraded examination delete that. Multilevel cervical disc degeneration with mild-to-moderate neural foraminal stenosis as above. No spinal stenosis. 2. Soft tissue changes in the lower neck consistent with recent parathyroidectomy.   Electronically Signed   By: Logan Bores   On: 02/17/2015 15:19   Nm Parathyroid W/spect  01/26/2015   CLINICAL DATA:  Hyperparathyroidism. Primary hyperparathyroidism. PTH equal 71  EXAM: NM PARATHYROID SCINTIGRAPHY AND SPECT IMAGING  TECHNIQUE: Following intravenous administration of radiopharmaceutical, early and 2-hour delayed planar images were obtained in the anterior projection. Delayed triplanar SPECT images were also obtained at 2 hours.  RADIOPHARMACEUTICALS:   26.4 mCiTc-27m Sestamibi IV  COMPARISON:  None.  FINDINGS: There is a focus of residual activity in the right aspect of the thyroid bed on the planar imaging. SPECT imaging demonstrates a distinct focus of radiotracer accumulation localizing to the superior aspect of the right lobe of the thyroid gland.  IMPRESSION: Focal activity localizing to the superior aspect of the RIGHT lobe of thyroid gland is most consists with parathyroid adenoma.   Electronically  Signed   By: Suzy Bouchard M.D.   On: 01/26/2015 14:19        Assessment & Plan   1. Altered mental status ? Rule out viral meningitis 2. Right-sided weakness with increased tone of muscles and negative workup including CT and MRI . Unclear neurologic etiology 3. Atrial fibrillation with rapid ventricular rate 4. Dehydration 5. Status post parathyroidectomy  Plan  Check LP studies start Zovirax Start IV fluids Neurology consult Increase Calan SR to twice a day         AM Labs Ordered, also please review Full Orders  Family Communication: Admission, patients condition and plan of care including tests being ordered have been discussed with the patient and daughters who indicate understanding and agree with the plan and Code Status.  Code Status full   Disposition Plan: Nursing home  Time spent in minutes : 35 minutes  Condition critical   @SIGNATURE @

## 2015-02-22 NOTE — ED Provider Notes (Signed)
CSN: [id number]     Arrival date & time 02/22/15  1621 History   First MD Initiated Contact with Patient 02/22/15 1628     Chief Complaint  Patient presents with  . Altered Mental Status     (Consider location/radiation/quality/duration/timing/severity/associated sxs/prior Treatment) HPI Comments: Patient presents with altered mental status. She's had a progressive decline over the last couple months. Her husband states that in January she was essentially normal. Over the last month she has had weakness on the right side of this progress. She had a bump in her calcium and the etiology was felt to be a parathyroid tumor which was removed on March 15. She was recently discharged from the hospital to [facility]. Over the last week she's had a marked decline to the point where she's not ambulating. She has tremors on the right side. She's not as alert as she normally is. She's had a decrease in appetite. Her husband says over the last few weeks she's had a very significant decline in her mental status. She was seen by her neurologist today who felt that her change in mental status was fairly significant and felt that she might have a viral type meningitis. It was felt that she needs to be admitted for further evaluation and likely lumbar puncture. She's had 2 recent MRIs within the right side weakness started which were normal.   Past Medical History  Diagnosis Date  . Anxiety   . Hypertension   . Glaucoma 12/31/2009  . ANXIETY 12/31/2009  . CHEST PAIN 12/08/2004  . FATIGUE 01/09/2011  . HYPERTENSION 12/31/2009  . LEFT BUNDLE BRANCH BLOCK 01/03/2010  . MALIGNANT MELANOMA, HX OF 01/03/2010  . MITRAL VALVE PROLAPSE 12/31/2009  . Raynaud's phenomenon   . Anemia     Macrocystosis  . Depression   . Hyperparathyroidism, primary   . Melanoma     "back"  . Basal cell carcinoma     "nose"  . Squamous carcinoma     RLE; chest"   Past Surgical History  Procedure Laterality Date  .  Melanoma excision      "off my back"  . Parathyroidectomy Right 02/16/2015    minimally invasive  . Tonsillectomy  1934  . Breast lumpectomy Left 1950  . Basal cell carcinoma excision    . Squamous cell carcinoma excision    . Eye surgery Right 1960    "rare retina problem"  . Parathyroidectomy N/A 02/16/2015    Procedure: RIGHT SUPERIOR PARATHYROIDECTOMY;  Surgeon: Armandina Gemma, MD;  Location: [facility] OR;  Service: General;  Laterality: N/A;   Family History  Problem Relation Age of Onset  . Heart disease Mother   . Kidney disease Father   . Heart attack Sister   . Cancer Brother     lung  . Hypertension Other   . Hyperlipidemia Other   . Healthy Daughter     x2   History  Substance Use Topics  . Smoking status: Never Smoker   . Smokeless tobacco: Never Used  . Alcohol Use: 4.2 oz/week    0 Standard drinks or equivalent, 7 Glasses of wine per week   OB History    No data available     Review of Systems  Constitutional: Positive for activity change, appetite change and fatigue. Negative for fever, chills and diaphoresis.  HENT: Negative for congestion, rhinorrhea and sneezing.   Eyes: Negative.   Respiratory: Negative for cough, chest tightness and shortness of breath.   Cardiovascular: Negative for  chest pain and leg swelling.  Gastrointestinal: Negative for nausea, vomiting, abdominal pain, diarrhea and blood in stool.  Genitourinary: Negative for frequency, hematuria, flank pain and difficulty urinating.  Musculoskeletal: Negative for back pain and arthralgias.  Skin: Negative for rash.  Neurological: Positive for tremors, speech difficulty and weakness. Negative for dizziness, numbness and headaches.  Psychiatric/Behavioral: Positive for confusion.      Allergies  Codeine  Home Medications   Prior to Admission medications   Medication Sig Start Date End Date Taking? Authorizing Provider  cholecalciferol (VITAMIN D) 1000 UNITS tablet Take 3,000 Units by mouth  daily.   Yes Historical Provider, MD  dorzolamide-timolol (COSOPT) 22.3-6.8 MG/ML ophthalmic solution Place 1 drop into both eyes every 12 (twelve) hours.     Yes Historical Provider, MD  ALPRAZolam (XANAX) 0.25 MG tablet Take 1 tablet (0.25 mg total) by mouth 2 (two) times daily as needed for anxiety. 02/01/15   Doe-Hyun R Shawna Orleans, DO  amLODipine (NORVASC) 5 MG tablet Take 0.5 tablets (2.5 mg total) by mouth daily. Patient taking differently: Take 2.5 mg by mouth 2 (two) times daily.  01/22/15   Doe-Hyun R Shawna Orleans, DO  ipratropium (ATROVENT) 0.06 % nasal spray USE 2 SPRAYS INTO EACH NOSTRIL ONCE DAILY AS NEEDED FOR RUNNY NOSE SYMPTOMS    Doe-Hyun R Yoo, DO  megestrol (MEGACE) 40 MG tablet Take 10 tablets (400 mg total) by mouth 2 (two) times daily. Patient not taking: Reported on 02/22/2015 02/18/15   Florencia Reasons, MD  polyethylene glycol [facility] / Floria Raveling) packet Take 17 g by mouth daily. Patient not taking: Reported on 02/22/2015 02/18/15   Florencia Reasons, MD  potassium chloride (K-DUR) 10 MEQ tablet TAKE ONE TABLET BY MOUTH ONCE DAILY 12/02/14   Doe-Hyun Kyra Searles, DO  Probiotic Product (ALIGN) 4 MG CAPS Take 1 capsule by mouth daily.    Historical Provider, MD  senna-docusate (SENOKOT-S) 8.6-50 MG per tablet Take 2 tablets by mouth 2 (two) times daily. Patient not taking: Reported on 02/22/2015 02/18/15   Florencia Reasons, MD  travoprost, benzalkonium, (TRAVATAN) 0.004 % ophthalmic solution Place 1 drop into both eyes at bedtime.      Historical Provider, MD  valsartan (DIOVAN) 320 MG tablet Take 320 mg by mouth daily. Take once daily.    Historical Provider, MD  verapamil (CALAN-SR) 120 MG CR tablet TAKE ONE TABLET AT BEDTIME 12/23/14   Doe-Hyun R Shawna Orleans, DO  vitamin B-12 (CYANOCOBALAMIN) 250 MCG tablet Take 1 tablet (250 mcg total) by mouth daily. 05/15/14   Doe-Hyun R Shawna Orleans, DO   BP 153/91 mmHg  Pulse 107  Temp(Src) 97.3 F (36.3 C) (Oral)  Resp 18  SpO2 99% Physical Exam  Constitutional: She appears well-developed and  well-nourished.  Ill-appearing female  HENT:  Head: Normocephalic and atraumatic.  Eyes: Pupils are equal, round, and reactive to light.  Neck: Normal range of motion. Neck supple.  Cardiovascular: Normal heart sounds.  An irregularly irregular rhythm present. Tachycardia present.   Pulmonary/Chest: Effort normal and breath sounds normal. No respiratory distress. She has no wheezes. She has no rales. She exhibits no tenderness.  Abdominal: Soft. Bowel sounds are normal. There is no tenderness. There is no rebound and no guarding.  Musculoskeletal: Normal range of motion. She exhibits no edema.  Lymphadenopathy:    She has no cervical adenopathy.  Neurological: She is alert.  Oriented to person and place. She knows the month but not the year or the date, she has no facial drooping or speech  deficit. She has weakness on the right side as compared to left. Her right arm is much weaker than the left. She's unable to lift her right leg against gravity. When she tries to move her right arm she has a significant tremor in her right arm.  Skin: Skin is warm and dry. No rash noted.  Psychiatric: She has a normal mood and affect.    ED Course  Procedures (including critical care time) Labs Review Labs Reviewed  COMPREHENSIVE METABOLIC PANEL - Abnormal; Notable for the following:    Glucose, Bld 116 (*)    BUN 32 (*)    Creatinine, Ser 1.14 (*)    Total Bilirubin 1.5 (*)    GFR calc non Af Amer 41 (*)    GFR calc Af Amer 48 (*)    All other components within normal limits  URINALYSIS, ROUTINE W REFLEX MICROSCOPIC - Abnormal; Notable for the following:    Color, Urine AMBER (*)    Bilirubin Urine SMALL (*)    Ketones, ur 15 (*)    Protein, ur 30 (*)    All other components within normal limits  CBC WITH DIFFERENTIAL/PLATELET - Abnormal; Notable for the following:    WBC 11.8 (*)    Neutrophils Relative % 78 (*)    Neutro Abs 9.2 (*)    Monocytes Absolute 1.1 (*)    All other components  within normal limits  URINE MICROSCOPIC-ADD ON - Abnormal; Notable for the following:    Casts HYALINE CASTS (*)    Crystals CA OXALATE CRYSTALS (*)    All other components within normal limits  URINE CULTURE  CULTURE, BLOOD (ROUTINE X 2)  CULTURE, BLOOD (ROUTINE X 2)  CSF CULTURE  GRAM STAIN  FUNGUS CULTURE W SMEAR  GRAM STAIN  CSF CULTURE  CSF CELL COUNT WITH DIFFERENTIAL  CSF CELL COUNT WITH DIFFERENTIAL  GLUCOSE, CSF  PROTEIN, CSF  CRYPTOCOCCAL ANTIGEN, CSF  VDRL, CSF  HERPES SIMPLEX VIRUS(HSV) DNA BY PCR  BETA ISOFORM(CREUZFELDT-JAKOB DISEASE)  GLUCOSE, CSF  PROTEIN, CSF  VDRL, CSF  CSF CELL COUNT WITH DIFFERENTIAL  CRYPTOCOCCAL ANTIGEN, CSF  HERPES SIMPLEX VIRUS(HSV) DNA BY PCR  CSF CELL COUNT WITH DIFFERENTIAL  I-STAT CG4 LACTIC ACID, ED    Imaging Review Dg Chest 2 View  02/22/2015   CLINICAL DATA:  Altered mental status, recent parathyroid surgery  EXAM: CHEST  2 VIEW  COMPARISON:  01/04/2009  FINDINGS: Lungs are clear.  No pleural effusion or pneumothorax.  The heart is normal in size.  Degenerative changes of the visualized thoracolumbar spine.  IMPRESSION: No evidence of acute cardiopulmonary disease.   Electronically Signed   By: Julian Hy M.D.   On: 02/22/2015 17:59   Ct Head Wo Contrast  02/22/2015   CLINICAL DATA:  Altered mental status, recent parathyroid surgery  EXAM: CT HEAD WITHOUT CONTRAST  TECHNIQUE: Contiguous axial images were obtained from the base of the skull through the vertex without intravenous contrast.  COMPARISON:  MRI brain dated 01/23/2015  FINDINGS: No evidence of parenchymal hemorrhage or extra-axial fluid collection. No mass lesion, mass effect, or midline shift.  No CT evidence of acute infarction.  Subcortical white matter and periventricular small vessel ischemic changes.  Cerebral volume is within normal limits.  No ventriculomegaly.  The visualized paranasal sinuses are essentially clear. The mastoid air cells are unopacified.   No evidence of calvarial fracture.  IMPRESSION: No evidence of acute intracranial abnormality.  Atrophy with small vessel ischemic changes.  Electronically Signed   By: Julian Hy M.D.   On: 02/22/2015 18:21   Dg Lumbar Puncture Fluoro Guide  02/22/2015   CLINICAL DATA:  Acute onset altered mental status for 1 week. Initial encounter.  EXAM: DIAGNOSTIC LUMBAR PUNCTURE UNDER FLUOROSCOPIC GUIDANCE  FLUOROSCOPY TIME:  Fluoroscopy Time (in minutes and seconds): 18 seconds  Number of Acquired Images:  One saved image  PROCEDURE: Informed consent was obtained from the patient's daughter prior to the procedure, including potential complications of headache, allergy, and pain. With the patient prone, the lower back was prepped with Betadine. 1% Lidocaine was used for local anesthesia. Lumbar puncture was performed at the L4-L5 level using a 20 gauge needle with return of mildly bloody and slightly hazy CSF with a very low non-measurable opening pressure. 8 mL of CSF were obtained for laboratory studies. The patient tolerated the procedure well and there were no apparent complications.  IMPRESSION: Successful lumbar puncture. Collected CSF slightly hazy and mildly serosanguineous in nature.   Electronically Signed   By: Garald Balding M.D.   On: 02/22/2015 23:14     EKG Interpretation   Date/Time:  Monday February 22 2015 16:30:39 EDT Ventricular Rate:  116 PR Interval:    QRS Duration: 127 QT Interval:  358 QTC Calculation: 497 R Axis:   -19 Text Interpretation:  Atrial flutter Left bundle branch block changed from  prior EKG Confirmed by Arieal Cuoco  MD, Jayr Lupercio ([id number]) on 02/22/2015 4:40:19 PM      MDM   Final diagnoses:  Altered mental status    Patient presents with a significant decline in her mental status over the last few weeks. I did attempt an LP but this was unsuccessful. I contacted radiology was able to do it under fluoroscopy. This will be sent for CSF studies. I consult the  hospitalist to admit the patient for further evaluation. I also discussed the patient with Dr. Leonel Ramsay who will see her.    Malvin Johns, MD 02/23/15 0001

## 2015-02-22 NOTE — Progress Notes (Addendum)
Patient here for EMG testing which I cancelled based on the drastic change in her clinical status since when I saw her on March 3 in the clinic. On that visit, patient was awake, alert, had spontaneous speech, cranial nerves were intact, motor strength is 5 out of 5 in all extremities with normal tone, and reflexes within normal limits throughout except absent Achilles bilaterally. The major deficit on exam was sensory ataxia and inability to stand independently.  At that visit, the concern of a subacute sensory ataxia and gait instability was being investigated with laboratory testing and imaging, which essentially returned unremarkable. Outpatient spinal fluid testing and EMG was pending.    In the interim, patient underwent surgery for hyperparathyroidism on 02/16/2015 and was discharged to rehabilitation facility on 02/18/2015. Per notes, patient was able to walk with a walker upon discharge. Over the past week, family reports there has been progressive neurological and functional decline. She has been unable to swallow solids and liquids by mouth and has been unable to ambulate even with a walker.  Today, she appears very ill and emaciated.  She is leaning towards the right side when sitting in her wheelchair. She is awake and is slow to follow simple commands, however when asked to show her thumb on her right hand there is evidence of neglect.   Pupils are round and reactive to light, gaze is conjugate however there is intermittent rhythmical movement of horizontal eye movements. Right visual field neglect.  Face appears symmetric, she tends to let her jaw hangs open. Smile is symmetric.  There is marked rigidity of the right upper and lower extremity. There is evidence of nuchal rigidity. Tone on the left side is mildly increased.  There is a lack of spontaneous movements of the right upper extremity and there is evidence of intermittent clonus. There is spontaneous movements of the left upper  extremity.  Bilateral lower extremities withdraws to pain.  There is evidence of hyperreflexia on the right side with frontal release signs present. Negative startle response.  Given the dramatic deterioration in her clinical presentation, patient needs STAT imaging (right side focal findings) and CSF testing for meningitis/encephalitis. High clinical suspicion for viral process, prion disease would have more of indolent course but also needs to be considered. I would expect the patient to be much sicker with bacterial meningitis, however elderly can manifest disease in a very atypical manner.  I discussed my thoughts with the family and contacted the emergency room physician and neurohospitalist.  Amira Podolak K. Posey Pronto, DO

## 2015-02-22 NOTE — ED Notes (Signed)
Report attempted 

## 2015-02-23 ENCOUNTER — Ambulatory Visit (HOSPITAL_COMMUNITY): Admission: RE | Admit: 2015-02-23 | Payer: PPO | Source: Ambulatory Visit

## 2015-02-23 ENCOUNTER — Encounter (HOSPITAL_COMMUNITY): Payer: Self-pay | Admitting: General Practice

## 2015-02-23 ENCOUNTER — Inpatient Hospital Stay (HOSPITAL_COMMUNITY): Payer: PPO

## 2015-02-23 ENCOUNTER — Telehealth: Payer: Self-pay | Admitting: *Deleted

## 2015-02-23 DIAGNOSIS — E860 Dehydration: Secondary | ICD-10-CM

## 2015-02-23 DIAGNOSIS — I4891 Unspecified atrial fibrillation: Secondary | ICD-10-CM

## 2015-02-23 DIAGNOSIS — G9340 Encephalopathy, unspecified: Principal | ICD-10-CM

## 2015-02-23 DIAGNOSIS — I480 Paroxysmal atrial fibrillation: Secondary | ICD-10-CM | POA: Insufficient documentation

## 2015-02-23 DIAGNOSIS — M6289 Other specified disorders of muscle: Secondary | ICD-10-CM

## 2015-02-23 DIAGNOSIS — I639 Cerebral infarction, unspecified: Secondary | ICD-10-CM | POA: Diagnosis present

## 2015-02-23 DIAGNOSIS — R001 Bradycardia, unspecified: Secondary | ICD-10-CM | POA: Insufficient documentation

## 2015-02-23 DIAGNOSIS — R7989 Other specified abnormal findings of blood chemistry: Secondary | ICD-10-CM

## 2015-02-23 LAB — SEDIMENTATION RATE: Sed Rate: 19 mm/hr (ref 0–22)

## 2015-02-23 LAB — CSF CELL COUNT WITH DIFFERENTIAL
RBC COUNT CSF: 4360 /mm3 — AB
RBC Count, CSF: 6135 /mm3 — ABNORMAL HIGH
TUBE #: 1
Tube #: 4
WBC, CSF: 2 /mm3 (ref 0–5)
WBC, CSF: 2 /mm3 (ref 0–5)

## 2015-02-23 LAB — URINE CULTURE
CULTURE: NO GROWTH
Colony Count: NO GROWTH
SPECIAL REQUESTS: NORMAL

## 2015-02-23 LAB — TROPONIN I
Troponin I: 0.05 ng/mL — ABNORMAL HIGH (ref <0.031)
Troponin I: 0.05 ng/mL — ABNORMAL HIGH (ref <0.031)
Troponin I: 0.05 ng/mL — ABNORMAL HIGH (ref <0.031)

## 2015-02-23 LAB — TSH: TSH: 0.259 u[IU]/mL — ABNORMAL LOW (ref 0.350–4.500)

## 2015-02-23 LAB — GRAM STAIN

## 2015-02-23 LAB — CRYPTOCOCCAL ANTIGEN, CSF: CRYPTO AG: NEGATIVE

## 2015-02-23 LAB — VITAMIN B12: Vitamin B-12: 828 pg/mL (ref 211–911)

## 2015-02-23 LAB — PROTEIN, CSF: Total  Protein, CSF: 127 mg/dL — ABNORMAL HIGH (ref 15–45)

## 2015-02-23 LAB — MAGNESIUM: Magnesium: 1.5 mg/dL (ref 1.5–2.5)

## 2015-02-23 LAB — GLUCOSE, CSF: Glucose, CSF: 67 mg/dL (ref 43–76)

## 2015-02-23 MED ORDER — METOPROLOL TARTRATE 1 MG/ML IV SOLN
5.0000 mg | Freq: Four times a day (QID) | INTRAVENOUS | Status: DC | PRN
  Administered 2015-02-24: 5 mg via INTRAVENOUS
  Filled 2015-02-23: qty 5

## 2015-02-23 MED ORDER — BOOST PLUS PO LIQD
237.0000 mL | ORAL | Status: DC
  Administered 2015-02-23: 237 mL via ORAL
  Filled 2015-02-23 (×6): qty 237

## 2015-02-23 MED ORDER — GADOBENATE DIMEGLUMINE 529 MG/ML IV SOLN
10.0000 mL | Freq: Once | INTRAVENOUS | Status: AC | PRN
  Administered 2015-02-23: 10 mL via INTRAVENOUS

## 2015-02-23 MED ORDER — ONDANSETRON HCL 4 MG PO TABS: 4.0000 mg | ORAL_TABLET | Freq: Four times a day (QID) | ORAL | Status: DC | PRN

## 2015-02-23 MED ORDER — ALPRAZOLAM 0.25 MG PO TABS
0.2500 mg | ORAL_TABLET | Freq: Once | ORAL | Status: AC
  Administered 2015-02-23: 0.25 mg via ORAL
  Filled 2015-02-23: qty 1

## 2015-02-23 MED ORDER — ONDANSETRON HCL 4 MG/2ML IJ SOLN: 4.0000 mg | Freq: Four times a day (QID) | INTRAMUSCULAR | Status: DC | PRN

## 2015-02-23 MED ORDER — SODIUM CHLORIDE 0.9 % IV SOLN
INTRAVENOUS | Status: DC
  Administered 2015-02-23: 01:00:00 via INTRAVENOUS

## 2015-02-23 MED ORDER — DILTIAZEM HCL 25 MG/5ML IV SOLN
5.0000 mg | Freq: Once | INTRAVENOUS | Status: AC
  Administered 2015-02-23: 5 mg via INTRAVENOUS
  Filled 2015-02-23: qty 5

## 2015-02-23 MED ORDER — SODIUM CHLORIDE 0.9 % IV SOLN
INTRAVENOUS | Status: AC
  Administered 2015-02-23: 19:00:00 via INTRAVENOUS

## 2015-02-23 MED ORDER — ASPIRIN 325 MG PO TABS
325.0000 mg | ORAL_TABLET | Freq: Once | ORAL | Status: DC
  Filled 2015-02-23: qty 1

## 2015-02-23 MED ORDER — SODIUM CHLORIDE 0.9 % IJ SOLN
3.0000 mL | Freq: Two times a day (BID) | INTRAMUSCULAR | Status: DC
  Administered 2015-02-24: 3 mL via INTRAVENOUS

## 2015-02-23 MED ORDER — SODIUM CHLORIDE 0.9 % IV SOLN: INTRAVENOUS | Status: DC

## 2015-02-23 MED ORDER — METOPROLOL TARTRATE 12.5 MG HALF TABLET
12.5000 mg | ORAL_TABLET | Freq: Two times a day (BID) | ORAL | Status: DC
  Administered 2015-02-23: 12.5 mg via ORAL
  Filled 2015-02-23 (×2): qty 1

## 2015-02-23 MED ORDER — ENSURE ENLIVE PO LIQD
237.0000 mL | Freq: Two times a day (BID) | ORAL | Status: DC
  Administered 2015-02-23: 237 mL via ORAL

## 2015-02-23 MED ORDER — BOOST / RESOURCE BREEZE PO LIQD
1.0000 | Freq: Three times a day (TID) | ORAL | Status: DC
  Administered 2015-02-23: 1 via ORAL

## 2015-02-23 MED ORDER — ADULT MULTIVITAMIN W/MINERALS CH
1.0000 | ORAL_TABLET | Freq: Every day | ORAL | Status: DC
  Administered 2015-02-23: 1 via ORAL
  Filled 2015-02-23: qty 1

## 2015-02-23 MED ORDER — ENOXAPARIN SODIUM 30 MG/0.3ML ~~LOC~~ SOLN
30.0000 mg | SUBCUTANEOUS | Status: DC
  Administered 2015-02-23 – 2015-02-24 (×2): 30 mg via SUBCUTANEOUS
  Filled 2015-02-23 (×2): qty 0.3

## 2015-02-23 MED ORDER — ACETAMINOPHEN 325 MG PO TABS: 650.0000 mg | ORAL_TABLET | Freq: Four times a day (QID) | ORAL | Status: DC | PRN

## 2015-02-23 MED ORDER — ENSURE PUDDING PO PUDG
1.0000 | Freq: Two times a day (BID) | ORAL | Status: DC
  Administered 2015-02-23 – 2015-02-26 (×3): 1 via ORAL

## 2015-02-23 NOTE — Evaluation (Signed)
Clinical/Bedside Swallow Evaluation Patient Details  Name: [name] MRN: [id number] Date of Birth: [birth year]  Today's Date: 02/23/2015 Time: SLP Start Time (ACUTE ONLY): 1057 SLP Stop Time (ACUTE ONLY): 1129 SLP Time Calculation (min) (ACUTE ONLY): 32 min  Past Medical History:  Past Medical History  Diagnosis Date  . Anxiety   . Hypertension   . Glaucoma 12/31/2009  . ANXIETY 12/31/2009  . CHEST PAIN 12/08/2004  . FATIGUE 01/09/2011  . HYPERTENSION 12/31/2009  . LEFT BUNDLE BRANCH BLOCK 01/03/2010  . MALIGNANT MELANOMA, HX OF 01/03/2010  . MITRAL VALVE PROLAPSE 12/31/2009  . Raynaud's phenomenon   . Anemia     Macrocystosis  . Depression   . Hyperparathyroidism, primary   . Melanoma     "back"  . Basal cell carcinoma     "nose"  . Squamous carcinoma     RLE; chest"   Past Surgical History:  Past Surgical History  Procedure Laterality Date  . Melanoma excision      "off my back"  . Parathyroidectomy Right 02/16/2015    minimally invasive  . Tonsillectomy  1934  . Breast lumpectomy Left 1950  . Basal cell carcinoma excision    . Squamous cell carcinoma excision    . Eye surgery Right 1960    "rare retina problem"  . Parathyroidectomy N/A 02/16/2015    Procedure: RIGHT SUPERIOR PARATHYROIDECTOMY;  Surgeon: Kari Gemma, MD;  Location: [place];  Service: General;  Laterality: N/A;   HPI:  81 yr old admitted from Neurologist (Dr. Posey [name] with [facility]) office with c/o declining physical and mental status x1 week. She underwent parathyroid surgery 3/15 and has been "declining" since then.Per notes tt is normally alert, sitting up, and able to converse. MRI no acute intracranial abnormality noted on this motion degraded exam. CXR no evidence of acute cardiopulmonary disease.   Assessment / Plan / Recommendation Clinical Impression  Pt seen for swallow assessment requiring moderate cueing due to delayed processing of information. No cough, throat clear or wet vocal cavity, however  facial grimace present following 80% of swallows across trials. She denied odnophagia or pharyngeal globus sensation; suspect grimace may be secondary to taste during 2 eposides. Confusion and recent thyroid surgery increase aspiration risk. Recommend Dys 2 (moister texture) and thin liquids, no straws, crush pills. ST will continue treatment.         Aspiration Risk  Moderate    Diet Recommendation Dysphagia 2 (Fine chop);Thin liquid   Liquid Administration via: Cup;No straw Medication Administration: Crushed with puree Supervision: Patient able to self feed;Full supervision/cueing for compensatory strategies Compensations: Slow rate;Small sips/bites Postural Changes and/or Swallow Maneuvers: Seated upright 90 degrees    Other  Recommendations Oral Care Recommendations: Oral care BID   Follow Up Recommendations   (TBD)    Frequency and Duration min 2x/week  2 weeks   Pertinent Vitals/Pain denies      Swallow Study           Oral/Motor/Sensory Function Overall Oral Motor/Sensory Function: Appears within functional limits for tasks assessed   Ice Chips Ice chips: Impaired Presentation: Spoon Oral Phase Impairments: Reduced lingual movement/coordination (swallowed whole ice in 5 seconds)   Thin Liquid Thin Liquid: Impaired Presentation: Cup;Spoon;Straw Oral Phase Impairments:  (suspect decreased control of larger volume with straw) Pharyngeal  Phase Impairments: Suspected delayed Swallow (grimace, suspect increased volume and velocity)    Nectar Thick Nectar Thick Liquid: Not tested   Honey Thick Honey Thick Liquid: Not tested   Puree  Puree: Impaired Presentation: Spoon Pharyngeal Phase Impairments: Suspected delayed Swallow (grimace)   Solid   GO    Solid: Impaired Oral Phase Impairments: Reduced lingual movement/coordination       [name] 02/23/2015,1:25 PM  [name] [name].Ed [employer] [phone]

## 2015-02-23 NOTE — Consult Note (Signed)
[name]   CARDIOLOGY CONSULT NOTE   Patient ID: [name] MRN: [id number], DOB/AGE: [id number]   Admit date: 02/22/2015 Date of Consult: 02/23/2015   Primary Physician: Drema Pry, DO Primary Cardiologist: New (previously seen by Dr. Rex Kras)  Pt. Profile  81 year old female with history of hypertension, left bundle branch block, mitral valve prolapse, primary hyperparathyroidism and history of Raynaud's present with drastic neurological decline over a span of 6 wks. On arrival, she was noted to be in a-fib with RVR  Problem List  Past Medical History  Diagnosis Date  . Anxiety   . Hypertension   . Glaucoma 12/31/2009  . ANXIETY 12/31/2009  . CHEST PAIN 12/08/2004  . FATIGUE 01/09/2011  . HYPERTENSION 12/31/2009  . LEFT BUNDLE BRANCH BLOCK 01/03/2010  . MALIGNANT MELANOMA, HX OF 01/03/2010  . MITRAL VALVE PROLAPSE 12/31/2009  . Raynaud's phenomenon   . Anemia     Macrocystosis  . Depression   . Hyperparathyroidism, primary   . Melanoma     "back"  . Basal cell carcinoma     "nose"  . Squamous carcinoma     RLE; chest"    Past Surgical History  Procedure Laterality Date  . Melanoma excision      "off my back"  . Parathyroidectomy Right 02/16/2015    minimally invasive  . Tonsillectomy  1934  . Breast lumpectomy Left 1950  . Basal cell carcinoma excision    . Squamous cell carcinoma excision    . Eye surgery Right 1960    "rare retina problem"  . Parathyroidectomy N/A 02/16/2015    Procedure: RIGHT SUPERIOR PARATHYROIDECTOMY;  Surgeon: Armandina Gemma, MD;  Location: [facility];  Service: General;  Laterality: N/A;     Allergies  Allergies  Allergen Reactions  . Codeine Other (See Comments)    Unk    HPI   The patient is a unfortunate 81 year old female with history of hypertension, left bundle branch block, mitral valve prolapse, primary hyperparathyroidism and history of Raynaud's. According to the daughter, she was seen by a cardiologist over 10 years ago and felt her  atypical chest pain at the time was caused by anxiety. Per record, she did have a cardiac catheterization by Dr. Rex Kras in January 2006 which showed 50% narrowing in mid LAD, normal EF, mitral valve prolapse without mitral regurgitation. The lesion in the LAD was felt to be not critical, and she was placed on medical management. She has not seen a cardiologist since. She has been living with her husband and has been able to take care of himself. According to the patient's daughter, she has been doing very well up until 6 weeks ago. Prior to that, she was able to do her own laundry, cooking and cleaning. She was also active prior to that. Within the last 6 weeks, patient had drastic decline in her mental, neurological and functional capacity. She is currently bed bound and has significant right-sided weakness and tremor. She was seen by outpatient neurology in the beginning of March for sensory ataxia and inability to stand independently. On her follow-up on March 21, she was noted to have progressive neurological and functional decline and had inability to swallow solid or liquid by mouth. She has been evaluated extensively as outpatient with previously negative MRI, thiamine, heavy metal, ESR, RPR, ethenol, TSH and B-12. He was referred for an EMG study on 2/21, at which time she was noted to have significant weakness and referred to the ED for further evaluation. Of note,  patient had parathyroidectomy on 02/16/2015.  During this admission, she was noted to have new onset of atrial fibrillation with RVR. Patient has been on verapamil at home. On arrival to the floor, she was in atrial fibrillation with RVR, she was given Cardizem 5 mg 1. Serial troponin has been minimally elevated. Although patient denies any chest discomfort or shortness of breath. Patient is undergoing further diagnosis by neurology for potential viral encephalitis, autoimmune encephalitis, seizure, CJD (Creutzfeldt-Jakob disease). She did come  out of a-fib briefly on 3/21 with severe sinus brady in high 30s to low 40s, however quickly went back into a-fib. Cardiology has been consulted for new onset a-fib  Patient is a poor historian given her neurological status, a portion of the history of present illness has been obtained from the patient herself, the other portion has been obtained from the record and her daughter.  Inpatient Medications  . acyclovir  600 mg Intravenous 3 times per day  . aspirin  325 mg Oral Once  . dorzolamide-timolol  1 drop Both Eyes BID  . enoxaparin (LOVENOX) injection  30 mg Subcutaneous Q24H  . feeding supplement (ENSURE ENLIVE)  237 mL Oral BID BM  . feeding supplement (RESOURCE BREEZE)  1 Container Oral TID BM  . potassium chloride  10 mEq Oral Daily  . sodium chloride  3 mL Intravenous Q12H  . Travoprost (BAK Free)  1 drop Both Eyes QHS  . verapamil  180 mg Oral BID  . vitamin B-12  250 mcg Oral Daily    Family History Family History  Problem Relation Age of Onset  . Heart disease Mother   . Kidney disease Father   . Heart attack Sister   . Cancer Brother     lung  . Hypertension Other   . Hyperlipidemia Other   . Healthy Daughter     x2     Social History History   Social History  . Marital Status: Married    Spouse Name: N/A  . Number of Children: N/A  . Years of Education: N/A   Occupational History  . Not on file.   Social History Main Topics  . Smoking status: Never Smoker   . Smokeless tobacco: Never Used  . Alcohol Use: 4.2 oz/week    0 Standard drinks or equivalent, 7 Glasses of wine per week  . Drug Use: No  . Sexual Activity: Not on file   Other Topics Concern  . Not on file   Social History Narrative   Lives with spouse (2nd marriage in 2009, 1st widowed in 1979) husband-- Elodie Panameno   Enjoys golf, not much aerobic activity--spends time in [place] part of the year.     Lives in a 2 story home.     Retired from Medical sales representative work           Review of  Systems  General:  No chills, fever Cardiovascular:  No chest pain, dyspnea on exertion, edema, orthopnea, palpitations, paroxysmal nocturnal dyspnea. Dermatological: No rash, lesions/masses Respiratory: No cough, dyspnea Urologic: No hematuria, dysuria Abdominal:   No nausea, vomiting, diarrhea, bright red blood per rectum, melena, or hematemesis Neurologic:  Significant neurological decline in last 6 weeks. Weakness noted on R upper and lower extremity. R upper extremity shaking.  All other systems reviewed and are otherwise negative except as noted above.  Physical Exam  Blood pressure 120/54, pulse 78, temperature 98.6 F (37 C), temperature source Oral, resp. rate 16, SpO2 98 %.  General: Bedbound,  answer question and followup command.  Psych: Normal affect. Neuro: Alert and oriented. Moves all extremities spontaneously. Notable R sided weakness in upper and lower extremity. RUE also shaking when talking.  HEENT: Normal  Neck: Supple without bruits or JVD. Lungs:  Resp regular and unlabored, anterior exam CTA. Heart: RRR no s3, s4, or murmurs. Abdomen: Soft, non-tender, non-distended, BS + x 4.  Extremities: No clubbing, cyanosis or edema. DP/PT/Radials 2+ and equal bilaterally.  Labs   Recent Labs  02/23/15 0720 02/23/15 1210  TROPONINI 0.05* 0.05*   Lab Results  Component Value Date   WBC 11.8* 02/22/2015   HGB 14.1 02/22/2015   HCT 40.5 02/22/2015   MCV 95.7 02/22/2015   PLT 198 02/22/2015    Recent Labs Lab 02/22/15 1658  NA 140  K 3.8  CL 105  CO2 23  BUN 32*  CREATININE 1.14*  CALCIUM 10.3  PROT 7.3  BILITOT 1.5*  ALKPHOS 57  ALT 20  AST 27  GLUCOSE 116*   Lab Results  Component Value Date   CHOL 177 04/06/2014   HDL 43.20 04/06/2014   LDLCALC 116* 04/06/2014   TRIG 91.0 04/06/2014   No results found for: DDIMER  Radiology/Studies  Dg Chest 2 View  02/22/2015   CLINICAL DATA:  Altered mental status, recent parathyroid surgery  EXAM:  CHEST  2 VIEW  COMPARISON:  01/04/2009  FINDINGS: Lungs are clear.  No pleural effusion or pneumothorax.  The heart is normal in size.  Degenerative changes of the visualized thoracolumbar spine.  IMPRESSION: No evidence of acute cardiopulmonary disease.   Electronically Signed   By: Julian Hy M.D.   On: 02/22/2015 17:59   Ct Head Wo Contrast  02/22/2015   CLINICAL DATA:  Altered mental status, recent parathyroid surgery  EXAM: CT HEAD WITHOUT CONTRAST  TECHNIQUE: Contiguous axial images were obtained from the base of the skull through the vertex without intravenous contrast.  COMPARISON:  MRI brain dated 01/23/2015  FINDINGS: No evidence of parenchymal hemorrhage or extra-axial fluid collection. No mass lesion, mass effect, or midline shift.  No CT evidence of acute infarction.  Subcortical white matter and periventricular small vessel ischemic changes.  Cerebral volume is within normal limits.  No ventriculomegaly.  The visualized paranasal sinuses are essentially clear. The mastoid air cells are unopacified.  No evidence of calvarial fracture.  IMPRESSION: No evidence of acute intracranial abnormality.  Atrophy with small vessel ischemic changes.   Electronically Signed   By: Julian Hy M.D.   On: 02/22/2015 18:21   Mr Brain Wo Contrast  01/31/2015   CLINICAL DATA:  Right-sided numbness over the last month which has increased in the last week.  EXAM: MRI HEAD WITHOUT CONTRAST  TECHNIQUE: Multiplanar, multiecho pulse sequences of the brain and surrounding structures were obtained without intravenous contrast.  COMPARISON:  MRI of the brain 01/23/2015 at [facility].  FINDINGS: No acute infarct, hemorrhage, or mass lesion is present. Moderate periventricular and subcortical white matter changes bilaterally are stable. The ventricles are proportionate to the degree of atrophy. No significant extraaxial fluid collection is present.  Flow is present in the major intracranial arteries. The  patient is status post bilateral lens replacements. The globes and orbits are otherwise intact. The paranasal sinuses and mastoid air cells are clear.  Skullbase is unremarkable. Midline structures are within normal limits.  IMPRESSION: Or  1. No acute intracranial abnormality or significant interval change. 2. Stable atrophy and diffuse white matter disease. This likely  reflects the sequelae of chronic microvascular ischemia.   Electronically Signed   By: San Morelle M.D.   On: 01/31/2015 14:14   Mr Jeri Cos JZ Contrast  02/23/2015   CLINICAL DATA:  81 year old female presenting with declining mental status for the past week. Parathyroid surgery 1 week ago. Difficulty ambulating. Loss of appetite. History of hypertension and melanoma. Subsequent encounter.  EXAM: MRI HEAD WITHOUT AND WITH CONTRAST  TECHNIQUE: Multiplanar, multiecho pulse sequences of the brain and surrounding structures were obtained without and with intravenous contrast.  CONTRAST:  63mL MULTIHANCE GADOBENATE DIMEGLUMINE 529 MG/ML IV SOLN  COMPARISON:  02/22/2015 head CT. 01/31/2015 and 01/23/2015 brain MR.  FINDINGS: Exam is motion degraded.  No acute infarct. Areas of altered signal intensity on diffusion sequence felt to be related to artifact rather than infarct or result of posterior reversible encephalopathy syndrome.  Moderate small vessel disease type changes.  No intracranial hemorrhage.  No intracranial mass or abnormal enhancement.  Global atrophy without hydrocephalus.  Minimal paranasal sinus mucosal thickening.  Orbital structures grossly within normal limits.  IMPRESSION: No acute intracranial abnormality noted on this motion degraded exam.  Moderate small vessel disease type changes.   Electronically Signed   By: Genia Del M.D.   On: 02/23/2015 09:25   Mr Cervical Spine Wo Contrast  02/17/2015   CLINICAL DATA:  Right arm weakness following recent parathyroidectomy.  EXAM: MRI CERVICAL SPINE WITHOUT CONTRAST   TECHNIQUE: Multiplanar, multisequence MR imaging of the cervical spine was performed. No intravenous contrast was administered.  COMPARISON:  None.  FINDINGS: Images are mildly to moderately degraded by motion artifact, particularly axial images. This limits detailed evaluation of degenerative changes as well as of the soft tissue changes in the lower right neck.  There is slight reversal of the normal cervical lordosis. There is no listhesis. Vertebral body heights are preserved. Vertebral bone marrow signal is mildly to moderately heterogeneous diffusely. No vertebral marrow edema is identified. A 1 cm hemangioma is noted in the C7 vertebral body. 8 mm lesion partially visualized in the T3 vertebral body also likely represents a hemangioma.  There is mild-to-moderate disc space narrowing at C4-5 and C5-6. Craniocervical junction is unremarkable. Cervical spinal cord is normal in caliber without definite signal abnormality identified. Anterior neck soft tissue edema and more focal signal changes about/posterior to the right thyroid lobe likely reflect postoperative changes from recent parathyroidectomy, possibly with a small underlying hematoma.  C2-3: Mild bilateral facet arthrosis without disc herniation or stenosis.  C3-4: Shallow disc bulging and mild uncovertebral spurring may result in minimal left neural foraminal narrowing without spinal stenosis.  C4-5: Broad-based posterior disc osteophyte complex results in mild-to-moderate bilateral neural foraminal stenosis without spinal stenosis.  C5-6: Broad-based posterior disc osteophyte complex results in likely mild right and moderate left neural foraminal stenosis without spinal stenosis.  C6-7:  No disc herniation or significant stenosis identified.  C7-T1:  Negative.  IMPRESSION: 1. Motion degraded examination delete that. Multilevel cervical disc degeneration with mild-to-moderate neural foraminal stenosis as above. No spinal stenosis. 2. Soft tissue changes  in the lower neck consistent with recent parathyroidectomy.   Electronically Signed   By: Logan Bores   On: 02/17/2015 15:19   Nm Parathyroid W/spect  01/26/2015   CLINICAL DATA:  Hyperparathyroidism. Primary hyperparathyroidism. PTH equal 71  EXAM: NM PARATHYROID SCINTIGRAPHY AND SPECT IMAGING  TECHNIQUE: Following intravenous administration of radiopharmaceutical, early and 2-hour delayed planar images were obtained in the anterior projection. Delayed triplanar SPECT  images were also obtained at 2 hours.  RADIOPHARMACEUTICALS:  26.4 mCiTc-27m Sestamibi IV  COMPARISON:  None.  FINDINGS: There is a focus of residual activity in the right aspect of the thyroid bed on the planar imaging. SPECT imaging demonstrates a distinct focus of radiotracer accumulation localizing to the superior aspect of the right lobe of the thyroid gland.  IMPRESSION: Focal activity localizing to the superior aspect of the RIGHT lobe of thyroid gland is most consists with parathyroid adenoma.   Electronically Signed   By: Suzy Bouchard M.D.   On: 01/26/2015 14:19   Dg Lumbar Puncture Fluoro Guide  02/22/2015   CLINICAL DATA:  Acute onset altered mental status for 1 week. Initial encounter.  EXAM: DIAGNOSTIC LUMBAR PUNCTURE UNDER FLUOROSCOPIC GUIDANCE  FLUOROSCOPY TIME:  Fluoroscopy Time (in minutes and seconds): 18 seconds  Number of Acquired Images:  One saved image  PROCEDURE: Informed consent was obtained from the patient's daughter prior to the procedure, including potential complications of headache, allergy, and pain. With the patient prone, the lower back was prepped with Betadine. 1% Lidocaine was used for local anesthesia. Lumbar puncture was performed at the L4-L5 level using a 20 gauge needle with return of mildly bloody and slightly hazy CSF with a very low non-measurable opening pressure. 8 mL of CSF were obtained for laboratory studies. The patient tolerated the procedure well and there were no apparent complications.   IMPRESSION: Successful lumbar puncture. Collected CSF slightly hazy and mildly serosanguineous in nature.   Electronically Signed   By: Garald Balding M.D.   On: 02/22/2015 23:14    ECG  A-fib with RVR  ASSESSMENT AND PLAN  1. Neurological decline with R sided weakness  - unclear cause, possible DD viral encephalitis, autoimmune encephalitis, seizure, CJD (Creutzfeldt-Jakob disease)  - MRI of brain negative. Unlikely caused by a-fib as MRI has been negative for stroke and no obvious sign of recovery to be diagnosed with TIA  - further workup per neurology team  2. Newly diagnosed A-fib with RVR - unkown duration given no cardiac awareness  - CHA2DS2-Vasc 43 (age, female, HTN)  - did convert out of a-fib on 3/21 briefly with severe bradycardia before going back into a-fib with RVR shortly after. despite having severe bradycardia after converting out of a-fib, patient is a poor candidate for PPM given her mental status. Would focus on rate control for now.   - currently on varapamil, will consider switch to metoprolol 12.$RemoveBeforeDE'5mg'[id number]$  for rate control  - pending echocardiogram.   - does not appear to be a good candidate for systemic anticoagulation either given poor mental status. No sign of stroke.   3. Mildly elevated trop: flat, likely demand ischemia. Poor candidate for any invasive workup.   4. HTN: if BP continue to be elevated despite metoprolol, will add amlodipine 2.$RemoveBeforeDE'5mg'[id number]$  daily  5. Hyperparathyroidism s/p parathyroidectomy  - TSH low, check free T4 6. Raynauds   Signed, Almyra Deforest, PA-C 02/23/2015, 2:11 PM  I have examined the patient and reviewed assessment and plan and discussed with patient.  Agree with above as stated. Tachybrady syndrome.  Try metoprolol for rate control to see if this prevents some of the extreme HR readings.  Watch BP. Controlled most recently.  Could add amlodipine at low dose if BP is high. Unclear reason for her rapid decline.  Add free T4 as well.   Findings and  rationale of med change  explained to family.   Jeslin Bazinet S.

## 2015-02-23 NOTE — Progress Notes (Addendum)
CCMD notified RN that patient was showing up A.Fib on the monitor. No history of A.Fib in patient chart. RN checked on patient and she is asymptomatic.  Patient did not take PM dose of verapamil due to NPO status so her heart rate has been increased. On-call NP paged, no new orders given at the moment. Telemetry strip from CCMD saved in chart. 12 lead EKG obtained and in patient chart. Burnell Blanks, RN

## 2015-02-23 NOTE — Progress Notes (Signed)
MD paged for Aspirin suppository. Patient stated she "could not swallow the pill at the moment". When patient thought she was ready, she spit the pill on the floor and did not sip any water afterwards. Burnell Blanks, RN

## 2015-02-23 NOTE — Progress Notes (Signed)
UR complete.  Jeydi Klingel RN, MSN 

## 2015-02-23 NOTE — Care Management Note (Unsigned)
    Page 1 of 1   02/25/2015     10:55:35 AM CARE MANAGEMENT NOTE 02/25/2015  Patient:  [name],[name]   Account Number:  [id number]  Date Initiated:  02/23/2015  Documentation initiated by:  Lorne Skeens  Subjective/Objective Assessment:   Patient was admitted with altered level of consciousness, possible meningitis/encephelitis. Lives at home with family.     Action/Plan:   Will follow for discharge needs pending PT/OT evals and physician orders.   Anticipated DC Date:     Anticipated DC Plan:  [place]         Choice offered to / List presented to:             Status of service:   Medicare Important Message given?  YES (If response is "NO", the following Medicare IM given date fields will be blank) Date Medicare IM given:  02/25/2015 Medicare IM given by:  Carles Collet Date Additional Medicare IM given:   Additional Medicare IM given by:    Discharge Disposition:    Per UR Regulation:  Reviewed for med. necessity/level of care/duration of stay  If discussed at [place] of Stay Meetings, dates discussed:    Comments:  02/25/15 10:15 Medicare IM letter provided. Carles Collet RN BSN CM

## 2015-02-23 NOTE — Progress Notes (Signed)
PT Cancellation Note  Patient Details Name: [name] MRN: [id number] DOB: 1923/06/10   Cancelled Treatment:    Reason Eval/Treat Not Completed: Patient not medically ready   Noted Troponin I ordered this morning at 0612 and results not yet available. Will attempt to see later today if medically appropriate (noted several other tests pending including CSF culture)   Bear Osten 02/23/2015, 8:24 AM Pager [phone]

## 2015-02-23 NOTE — Progress Notes (Signed)
RN paged this NP secondary to pt being in Afib on tele. Pt was admitted last evening with progressive functional decline over past 2 months. Hx same symptoms on admission here in earlier March as well. Neuro has seen and workup is in progress. CT head neg on admission. MRI brain this am.  Per RN, pt came to floor about 1am and had tachycardia. Later, tele alarmed Afib. 12 lead confirmed Afib with RVR 120 BPM. Pt has not had any chest pain or SOB. EKG with LBBB. Review of old EKG shows LBBB last month.  No noted hx of Afib on pt's chart here in [facility]. Is on Verapamil at home. Family denies Afib hx.  Will give Cardizem 5mg  IV x 1 as BP is stable. Continue tele. Cycle troponins. ASA now. No active chest pain.  May need echo. Clance Boll, NP [facility]

## 2015-02-23 NOTE — Evaluation (Signed)
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 Pt with recent progressive decline in functional mobility in the last 2 months with impaired balance, gait, and cognitive impairments. MRI negative; CSF cultures pending (negative for meningitis); multiple OP tests have been negative.  Clinical Impression  Pt admitted with above symptoms of unknown etiology. Pt currently with functional limitations due to the deficits listed below (see PT Problem List).  Pt may benefit from skilled PT to increase their safety with mobility and to allow discharge to the venue listed below.       Follow Up Recommendations SNF;Supervision/Assistance - 24 hour    Equipment Recommendations  None recommended by PT    Recommendations for Other Services OT consult     Precautions / Restrictions Precautions Precautions: Fall      Mobility  Bed Mobility Overal bed mobility: Needs Assistance Bed Mobility: Rolling Rolling: Max assist Sidelying to sit:  (pt refused)       General bed mobility comments: roll to pt's Rt with max assist; required assist to turn and raise head to look for the rail; assist to reach across body with LUE; pt assisted by pulling on rail  Transfers                    Ambulation/Gait                Stairs            Wheelchair Mobility    Modified Rankin (Stroke Patients Only)       Balance                                             Pertinent Vitals/Pain Pain Assessment: No/denies pain    Home Living Family/patient expects to be discharged to:: Skilled nursing facility Living Arrangements: Spouse/significant other Available Help at Discharge: Family;Personal care attendant;Available 24  hours/day Type of Home: House (at [employer]) Home Access: Stairs to enter Entrance Stairs-Rails: None Entrance Stairs-Number of Steps: 2 Home Layout: Two level;Able to live on main level with bedroom/bathroom Home Equipment: Toilet riser;Grab bars - toilet;Walker - 4 wheels;Bedside commode Additional Comments: pt recently discharged to SNF for therapies    Prior Function Level of Independence: Needs assistance   Gait / Transfers Assistance Needed: prior to decline over 6-8 weeks she was  ambulatory/independent/driving; last admission assist for ambulation and transfers with use of rollator. recently unable to walk and using wheelchair           Hand Dominance   Dominant Hand: Right    Extremity/Trunk Assessment   Upper Extremity Assessment: RUE deficits/detail;Generalized weakness RUE Deficits / Details: resting tremors (and at times look more like spasms); AAROM WFL; shoulder 3-/5, grip 3/5, but could not release grip; once palm empty pt could not actively extend Rt finger     LUE Deficits / Details: 3+ to 4/5 general strength; AROM WFL   Lower Extremity Assessment: Generalized weakness;RLE deficits/detail RLE Deficits / Details: incr extensor tone RLE; PROM able to achieve 90 hip flexion and 120 knee flexion (in sidelying)       Communication   Communication: Expressive difficulties (sometimes garbled)  Cognition Arousal/Alertness: Lethargic (  vs delayed responses) Behavior During Therapy: Flat affect Overall Cognitive Status: Impaired/Different from baseline Area of Impairment: Following commands;Problem solving       Following Commands: Follows one step commands with increased time     Problem Solving: Slow processing;Decreased initiation;Difficulty sequencing;Requires verbal cues;Requires tactile cues General Comments: Pt with delayed response time to answer questions requiring repetition and increased time. Expressive difficulties.     General Comments  General comments (skin integrity, edema, etc.): Daughter present. Discussed role of PT    Exercises Other Exercises Other Exercises: PROM-AAROM x 4 extremities      Assessment/Plan    PT Assessment Patient needs continued PT services  PT Diagnosis Generalized weakness;Altered mental status;Other (comment) (?neurodegenerative)   PT Problem List Decreased strength;Decreased activity tolerance;Decreased mobility;Decreased cognition;Decreased knowledge of use of DME;Impaired tone  PT Treatment Interventions DME instruction;Functional mobility training;Therapeutic activities;Therapeutic exercise;Balance training;Neuromuscular re-education;Cognitive remediation;Patient/family education   PT Goals (Current goals can be found in the Care Plan section) Acute Rehab PT Goals Patient Stated Goal: pt not stated; agrees to attempt PT for mobility PT Goal Formulation: With patient/family Time For Goal Achievement: 03/09/15 Potential to Achieve Goals: Poor    Frequency Min 2X/week   Barriers to discharge        Co-evaluation               End of Session   Activity Tolerance: Patient limited by fatigue Patient left: in bed;with nursing/sitter in room;with family/visitor present Nurse Communication: Other (comment) (Rt sidelying seems to ease tremors)         Time: 5465-0354 PT Time Calculation (min) (ACUTE ONLY): 26 min   Charges:   PT Evaluation $Initial PT Evaluation Tier I: 1 Procedure PT Treatments $Therapeutic Exercise: 8-22 mins   PT G Codes:        Kennedi Lizardo 03-23-15, 2:39 PM Pager [phone]

## 2015-02-23 NOTE — Progress Notes (Signed)
EEG completed; results pending.    

## 2015-02-23 NOTE — Progress Notes (Addendum)
PROGRESS NOTE    [name] HYW:[id number] DOB: 09/23/1923 DOA: 02/22/2015 PCP: Drema Pry, DO  Primary Neurologist: Dr. Narda Amber  HPI/Brief narrative 81 year old female patient with history of HTN, LBBB, MVP, anxiety & depression, primary hyperparathyroidism s/p parathyroidectomy 02/16/15, discharged from rehabilitation facility on 02/18/15, seen by OP neurology on 02/04/15 for sensory ataxia and inability to stand independently, returned to her neurologist for evaluation on 3/21 with history of progressive neurological and functional decline over the last 3 weeks (more so in the last week), inability to swallow solids or liquids by mouth, inability to ambulate even with a walker. Neurologist noticed right sided weakness, right visual field neglect, right sided nystagmus, neck rigidity, rigidity of right extremities. Due to her dramatic deterioration, she was sent to the ED for further evaluation including concerns for viral meningitis/encephalitis versus other etiology. She was evaluated extensively as outpatient with MRI brain, thiamine, heavy metals, ESR, RPR, ethanol, TSH and B-12 which were reportedly normal. She was supposed to have EMG studies on 3/21 which were obviously canceled. Hospitalized for further evaluation. Neurology is consulting.  Assessment/Plan:  Acute encephalopathy/neurological decline/right hemiparesis - DD: Viral encephalitis, CJD, autoimmune encephalitis, seizures versus other etiology. - CT head and MRI brain without acute changes. - CSF shows large amount of red blood cells-? Traumatic versus real. Not indicative of bacterial meningitis. - CSF VDRL, HSV PCR, CJ studies, VZV PCR, bacterial and fungal culture & cytology: Pending. CSF Gram stain did not show organisms and no yeast or fungal elements seen. - Continue empiric acyclovir. - Follow EEG results. - B-12 and TSH results pending. - Serum RPR negative on 01/31/15. - Cryptococcal antigen: Negative. -  Neurology consultation appreciated. - PRN Xanax temporarily held secondary to lethargy. - check ESR  Atrial fibrillation with RVR - New onset as per discussion with family. - Check 2-D echo and TSH - Continue verapamil (on for HTN) and monitor. If not controlled, may need to start something IV or add another agent by mouth. - CHA2DS2-Vasc score: 4. Anticoagulation indicated. Will discuss with family and consider cardiology consultation. [facility] consult cardiology.  Dehydration - Secondary to poor oral intake. - Brief IV fluid hydration and monitor clinically.  Mildly elevated troponin - No reported chest pain - EKG with A. fib with RVR and chronic LBBB. - Likely secondary to demand ischemia from A. fib with RVR. - Follow 2-D echo results. Continue aspirin.  Dysphagia - Speech therapy consultation and diet per ST  Essential hypertension - Mildly uncontrolled - Continue verapamil. Temporarily hold amlodipine and Diovan unless blood pressure starts to increase.  S/P parathyroidectomy for primary hyperparathyroidism - Serum calcium within normal limits. - Check TSH.  Anxiety & depression  LBBB  Failure to thrive - Secondary to neurological decline complicating advanced age and fraility - PT & OT eval      Code Status: Full Family Communication: Discussed with oldest daughter at bedside Disposition Plan: Probably SNF when medically stable   Consultants:  Neurology  Procedures:  LP 3/21  Antibiotics:  IV Acyclovir   Subjective: Pleasantly confused. No clear complaints. Denied chest pain.   Objective: Filed Vitals:   02/22/15 2000 02/23/15 0120 02/23/15 0438 02/23/15 1024  BP: 153/91 150/77 158/68 136/72  Pulse: 107   122  Temp:  97.9 F (36.6 C) 96.6 F (35.9 C) 97.7 F (36.5 C)  TempSrc:  Axillary Axillary Oral  Resp: $Remo'18 18 18 16  'zvACH$ SpO2: 99% 99% 100% 97%   No intake or output  data in the 24 hours ending 02/23/15 1203 There were no vitals filed  for this visit.   Exam:  General exam: Moderately built and frail, chronically ill-looking elderly female lying comfortably supine in bed. Dry oral mucosa. Decreased skin turgor.  Respiratory system: Clear. No increased work of breathing. Cardiovascular system: S1 & S2 heard,irregularly irregular and mildly tachycardic.[name] Kitchen No JVD, murmurs, gallops, clicks or pedal edema.Telemetry: A. fib with RVR in the 120s.  Gastrointestinal system: Abdomen is nondistended, soft and nontender. Normal bowel sounds heard. Central nervous system: Alert and oriented only to self and partly to place. No obvious cranial nerve deficits. No nystagmus observed today.  Extremities: Grade 4 x 5 power at least in left limbs, grade 3 x 5 power in right lower extremity, grade 4 x 5 power in right upper extremity with pronator drift.  Data Reviewed: Basic Metabolic Panel:  Recent Labs Lab 02/17/15 0625 02/18/15 0618 02/22/15 1658 02/23/15 0720  NA 139 139 140  --   K 3.9 2.8* 3.8  --   CL 103 102 105  --   CO2 $Re'30 28 23  'LoG$ --   GLUCOSE 126* 102* 116*  --   BUN 11 9 32*  --   CREATININE 0.87 0.70 1.14*  --   CALCIUM 9.4 9.2 10.3  --   MG  --   --   --  1.5   Liver Function Tests:  Recent Labs Lab 02/22/15 1658  AST 27  ALT 20  ALKPHOS 57  BILITOT 1.5*  PROT 7.3  ALBUMIN 3.6   No results for input(s): LIPASE, AMYLASE in the last 168 hours. No results for input(s): AMMONIA in the last 168 hours. CBC:  Recent Labs Lab 02/22/15 1658  WBC 11.8*  NEUTROABS 9.2*  HGB 14.1  HCT 40.5  MCV 95.7  PLT 198   Cardiac Enzymes:  Recent Labs Lab 02/23/15 0720  TROPONINI 0.05*   BNP (last 3 results) No results for input(s): PROBNP in the last 8760 hours. CBG: No results for input(s): GLUCAP in the last 168 hours.  Recent Results (from the past 240 hour(s))  Gram stain     Status: None   Collection Time: 02/22/15 11:09 PM  Result Value Ref Range Status   Specimen Description CSF  Final   Special  Requests TUBE 2  Final   Gram Stain   Final    CYTOSPIN SLIDE WBC PRESENT,BOTH PMN AND MONONUCLEAR NO ORGANISMS SEEN    Report Status 02/23/2015 FINAL  Final  CSF culture     Status: None (Preliminary result)   Collection Time: 02/22/15 11:10 PM  Result Value Ref Range Status   Specimen Description CSF  Final   Special Requests TUBE 2  Final   Gram Stain   Final    CYTOSPIN SLIDE WBC PRESENT,BOTH PMN AND MONONUCLEAR NO ORGANISMS SEEN Performed at [facility] Performed at [place]    Culture NO GROWTH Performed at [facility]   Final   Report Status PENDING  Incomplete  Fungus Culture with Smear     Status: None (Preliminary result)   Collection Time: 02/22/15 11:27 PM  Result Value Ref Range Status   Specimen Description CSF  Final   Special Requests NONE  Final   Fungal Smear   Final    NO YEAST OR FUNGAL ELEMENTS SEEN Performed at [facility]    Culture   Final    CULTURE IN PROGRESS FOR FOUR WEEKS Performed at [facility]    Report Status PENDING  Incomplete         Studies: Dg Chest 2 View  02/22/2015   CLINICAL DATA:  Altered mental status, recent parathyroid surgery  EXAM: CHEST  2 VIEW  COMPARISON:  01/04/2009  FINDINGS: Lungs are clear.  No pleural effusion or pneumothorax.  The heart is normal in size.  Degenerative changes of the visualized thoracolumbar spine.  IMPRESSION: No evidence of acute cardiopulmonary disease.   Electronically Signed   By: Julian Hy M.D.   On: 02/22/2015 17:59   Ct Head Wo Contrast  02/22/2015   CLINICAL DATA:  Altered mental status, recent parathyroid surgery  EXAM: CT HEAD WITHOUT CONTRAST  TECHNIQUE: Contiguous axial images were obtained from the base of the skull through the vertex without intravenous contrast.  COMPARISON:  MRI brain dated 01/23/2015  FINDINGS: No evidence of parenchymal hemorrhage or extra-axial fluid collection. No mass lesion, mass effect, or midline  shift.  No CT evidence of acute infarction.  Subcortical white matter and periventricular small vessel ischemic changes.  Cerebral volume is within normal limits.  No ventriculomegaly.  The visualized paranasal sinuses are essentially clear. The mastoid air cells are unopacified.  No evidence of calvarial fracture.  IMPRESSION: No evidence of acute intracranial abnormality.  Atrophy with small vessel ischemic changes.   Electronically Signed   By: Julian Hy M.D.   On: 02/22/2015 18:21   Mr Jeri Cos EH Contrast  02/23/2015   CLINICAL DATA:  81 year old female presenting with declining mental status for the past week. Parathyroid surgery 1 week ago. Difficulty ambulating. Loss of appetite. History of hypertension and melanoma. Subsequent encounter.  EXAM: MRI HEAD WITHOUT AND WITH CONTRAST  TECHNIQUE: Multiplanar, multiecho pulse sequences of the brain and surrounding structures were obtained without and with intravenous contrast.  CONTRAST:  46mL MULTIHANCE GADOBENATE DIMEGLUMINE 529 MG/ML IV SOLN  COMPARISON:  02/22/2015 head CT. 01/31/2015 and 01/23/2015 brain MR.  FINDINGS: Exam is motion degraded.  No acute infarct. Areas of altered signal intensity on diffusion sequence felt to be related to artifact rather than infarct or result of posterior reversible encephalopathy syndrome.  Moderate small vessel disease type changes.  No intracranial hemorrhage.  No intracranial mass or abnormal enhancement.  Global atrophy without hydrocephalus.  Minimal paranasal sinus mucosal thickening.  Orbital structures grossly within normal limits.  IMPRESSION: No acute intracranial abnormality noted on this motion degraded exam.  Moderate small vessel disease type changes.   Electronically Signed   By: Genia Del M.D.   On: 02/23/2015 09:25   Dg Lumbar Puncture Fluoro Guide  02/22/2015   CLINICAL DATA:  Acute onset altered mental status for 1 week. Initial encounter.  EXAM: DIAGNOSTIC LUMBAR PUNCTURE UNDER  FLUOROSCOPIC GUIDANCE  FLUOROSCOPY TIME:  Fluoroscopy Time (in minutes and seconds): 18 seconds  Number of Acquired Images:  One saved image  PROCEDURE: Informed consent was obtained from the patient's daughter prior to the procedure, including potential complications of headache, allergy, and pain. With the patient prone, the lower back was prepped with Betadine. 1% Lidocaine was used for local anesthesia. Lumbar puncture was performed at the L4-L5 level using a 20 gauge needle with return of mildly bloody and slightly hazy CSF with a very low non-measurable opening pressure. 8 mL of CSF were obtained for laboratory studies. The patient tolerated the procedure well and there were no apparent complications.  IMPRESSION: Successful lumbar puncture. Collected CSF slightly hazy and mildly serosanguineous in nature.  Electronically Signed   By: Garald Balding M.D.   On: 02/22/2015 23:14        Scheduled Meds: . acyclovir  600 mg Intravenous 3 times per day  . aspirin  325 mg Oral Once  . dorzolamide-timolol  1 drop Both Eyes BID  . enoxaparin (LOVENOX) injection  30 mg Subcutaneous Q24H  . feeding supplement (ENSURE ENLIVE)  237 mL Oral BID BM  . feeding supplement (RESOURCE BREEZE)  1 Container Oral TID BM  . potassium chloride  10 mEq Oral Daily  . sodium chloride  3 mL Intravenous Q12H  . Travoprost (BAK Free)  1 drop Both Eyes QHS  . verapamil  180 mg Oral BID  . vitamin B-12  250 mcg Oral Daily   Continuous Infusions: . sodium chloride      Active Problems:   Altered mental status   CVA (cerebral infarction)    Time spent: 50 minutes.    Vernell Leep, MD, FACP, FHM. [facility] Pager [phone]  If 7PM-7AM, please contact night-coverage [email or web address] Password [email or web address] 02/23/2015, 12:03 PM    LOS: 1 day

## 2015-02-23 NOTE — Progress Notes (Signed)
New orders regarding patient heart rhythm received from on-call NP.  Burnell Blanks, RN

## 2015-02-23 NOTE — Progress Notes (Signed)
INITIAL NUTRITION ASSESSMENT  DOCUMENTATION CODES Per approved criteria  -Severe malnutrition in the context of acute illness or injury  Pt meets nutrition-related criteria for SEVERE MALNUTRITION in the context of ACUTE ILLNESS as evidenced by 14% weight loss in less than 3 months and estimated energy intake <50% of estimated energy needs for > 5 days.  INTERVENTION: Provide Ensure Pudding BID after meals, each supplement provides 170 kcal and 4 grams of protein Provide Boost Plus once daily, provides 360 kcal and 14 grams of protein Provide Magic Cup ice cream with lunch/dinner, each supplement provides 290 kcal and 9 grams of protein Offer patient meals/snacks/supplements every 2 hours Provide Multivitamin with minerals daily  NUTRITION DIAGNOSIS: Inadequate oral intake related to poor appetite as evidenced by family report.   Goal: Pt to meet >/= 90% of their estimated nutrition needs    Monitor:  PO intake, supplement acceptance, weight trend, labs  Reason for Assessment: Malnutrition Screening Tool, score of 2  81 y.o. female  Admitting Dx: <principal problem not specified>  ASSESSMENT: 81 year old female patient with history of HTN, LBBB, MVP, anxiety & depression, primary hyperparathyroidism s/p parathyroidectomy 02/16/15, discharged from rehabilitation facility on 02/18/15. Returned to her neurologist for evaluation on 3/21 with history of progressive neurological and functional decline over the last 3 weeks. Due to her dramatic deterioration, she was sent to the ED for further evaluation .  Pt familiar to nutrition team from previous admission at which time patient met criteria for severe malnutrition in the context of acute illness. Pt very lethargic at time of visit and fell asleep during assessment- family assisted in providing information. Pt reports ongoing poor appetite. Family estimates that pt has consumed approximately 200 calories daily for the past week; pt only  eats a couple bites of food at each meal and takes a few sips of Boost supplements some days. Pt denies nausea, abdominal pain, and nausea. Pt's diet advanced from clear liquids to Dysphagia 2 this morning.  Most recent weight from previous admission. Per weight history, pt has lost 14% of her body weight in less than 3 months.   Labs reviewed.   Height: Ht Readings from Last 1 Encounters:  02/16/15 _0  (1.6 m)    Weight: Wt Readings from Last 1 Encounters:  02/16/15 131 lb (59.421 kg)    Ideal Body Weight: 115 lbs  % Ideal Body Weight: unknown  Wt Readings from Last 10 Encounters:  02/16/15 131 lb (59.421 kg)  02/04/15 143 lb 2 oz (64.921 kg)  01/20/15 149 lb (67.586 kg)  12/23/14 152 lb (68.947 kg)  12/07/14 154 lb 12.8 oz (70.217 kg)  09/30/14 157 lb (71.215 kg)  06/03/14 151 lb (68.493 kg)  05/15/14 152 lb (68.947 kg)  04/13/14 152 lb (68.947 kg)  10/10/13 152 lb (68.947 kg)    Usual Body Weight: 152 lbs  % Usual Body Weight: 86%  BMI:  There is no weight on file to calculate BMI.  Estimated Nutritional Needs: Kcal: 1450-1650 Protein: 70-80 grams Fluid: 1.5-1.7 L/day  Skin: closed incision on right neck  Diet Order: DIET DYS 2, thin liquids  EDUCATION NEEDS: -No education needs identified at this time  No intake or output data in the 24 hours ending 02/23/15 1354  Last BM: PTA   Labs:   Recent Labs Lab 02/17/15 0625 02/18/15 0618 02/22/15 1658 02/23/15 0720  NA 139 139 140  --   K 3.9 2.8* 3.8  --   CL 103 102 105  --  CO2 _0 --   BUN 11 9 32*  --   CREATININE 0.87 0.70 1.14*  --   CALCIUM 9.4 9.2 10.3  --   MG  --   --   --  1.5  GLUCOSE 126* 102* 116*  --     CBG (last 3)  No results for input(s): GLUCAP in the last 72 hours.  Scheduled Meds: . acyclovir  600 mg Intravenous 3 times per day  . aspirin  325 mg Oral Once  . dorzolamide-timolol  1 drop Both Eyes BID  . enoxaparin (LOVENOX) injection  30 mg Subcutaneous Q24H   . feeding supplement (ENSURE ENLIVE)  237 mL Oral BID BM  . feeding supplement (RESOURCE BREEZE)  1 Container Oral TID BM  . potassium chloride  10 mEq Oral Daily  . sodium chloride  3 mL Intravenous Q12H  . Travoprost (BAK Free)  1 drop Both Eyes QHS  . verapamil  180 mg Oral BID  . vitamin B-12  250 mcg Oral Daily    Continuous Infusions: . sodium chloride      Past Medical History  Diagnosis Date  . Anxiety   . Hypertension   . Glaucoma 12/31/2009  . ANXIETY 12/31/2009  . CHEST PAIN 12/08/2004  . FATIGUE 01/09/2011  . HYPERTENSION 12/31/2009  . LEFT BUNDLE BRANCH BLOCK 01/03/2010  . MALIGNANT MELANOMA, HX OF 01/03/2010  . MITRAL VALVE PROLAPSE 12/31/2009  . Raynaud's phenomenon   . Anemia     Macrocystosis  . Depression   . Hyperparathyroidism, primary   . Melanoma     "back"  . Basal cell carcinoma     "nose"  . Squamous carcinoma     RLE; chest"    Past Surgical History  Procedure Laterality Date  . Melanoma excision      "off my back"  . Parathyroidectomy Right 02/16/2015    minimally invasive  . Tonsillectomy  1934  . Breast lumpectomy Left 1950  . Basal cell carcinoma excision    . Squamous cell carcinoma excision    . Eye surgery Right 1960    "rare retina problem"  . Parathyroidectomy N/A 02/16/2015    Procedure: RIGHT SUPERIOR PARATHYROIDECTOMY;  Surgeon: Armandina Gemma, MD;  Location: [facility];  Service: General;  Laterality: N/A;    Pryor Ochoa RD, LDN Inpatient Clinical Dietitian Pager: [phone] After Hours Pager: [phone]

## 2015-02-23 NOTE — Consult Note (Signed)
Neurology Consultation Reason for Consult: Neurological decline Referring Physician: Hijazi, A  CC: Neurological decline  History is obtained from: Family  HPI: [name] is a 81 y.o. female with a history of 2 months of decline in mental status and function. They state that the initial thing that she started having trouble with was right-sided numbness and weakness for which she was admitted in late February. It initially involved the right leg and then subsequently the right arm as well. She had an MRI which was essentially negative. She did have a thyroid nodule which was removed. She had an evaluation by Dr. Posey Pronto on March 3. At that time, she was unable to walk without a rolling walker. She had extensive laboratory testing including thiamine, heavy metals, ESR, RPR, ethanol, TSH, B12 which were all reportedly normal. She did not have any clear motor weakness at that time. She did appear to have some sensory ataxia and EMG was scheduled to be performed. She was also scheduled for outpatient CSF evaluation, however before this was performed she presented today for her EMG and given a marked decline in the interim 3 weeks was referred for admission.  The patient is able to answer some questions, but does have signs of an aphasia.    ROS:  Unable to obtain due to altered mental status.   Past Medical History  Diagnosis Date  . Anxiety   . Hypertension   . Glaucoma 12/31/2009  . ANXIETY 12/31/2009  . CHEST PAIN 12/08/2004  . FATIGUE 01/09/2011  . HYPERTENSION 12/31/2009  . LEFT BUNDLE BRANCH BLOCK 01/03/2010  . MALIGNANT MELANOMA, HX OF 01/03/2010  . MITRAL VALVE PROLAPSE 12/31/2009  . Raynaud's phenomenon   . Anemia     Macrocystosis  . Depression   . Hyperparathyroidism, primary   . Melanoma     "back"  . Basal cell carcinoma     "nose"  . Squamous carcinoma     RLE; chest"    Family History: No history of autoimmune disease  Social History: Tob: Denies  Exam: Current  vital signs: BP 153/91 mmHg  Pulse 107  Temp(Src) 97.3 F (36.3 C) (Oral)  Resp 18  SpO2 99% Vital signs in last 24 hours: Temp:  [97.3 F (36.3 C)] 97.3 F (36.3 C) (03/21 1709) Pulse Rate:  [98-131] 107 (03/21 2000) Resp:  [15-22] 18 (03/21 2000) BP: (117-164)/(60-91) 153/91 mmHg (03/21 2000) SpO2:  [99 %-100 %] 99 % (03/21 2000)   Physical Exam  Constitutional: Appears elderly Psych: Affect appropriate to situation Eyes: No scleral injection HENT: No OP obstrucion Head: Normocephalic. Mild erythema bilateral cheeks Cardiovascular: Normal rate and regular rhythm.  Respiratory: Effort normal  GI: Soft.  No distension. There is no tenderness.  Skin: WDI  Neuro: Mental Status: Patient is awake, alert, she consistently looks to the right, but is able to look to the left of prompted to. She is able to answer questions, but often makes word substitutions and appears to have word finding difficulty as well. Cranial Nerves: II: Visual Fields appear full, though patient is not consistent in the right field which could be consistent with neglect.. Pupils are equal, round, and reactive to light.   III,IV, VI: EOMI though with right gaze preference V: Facial sensation is decreased on the right VII: Facial movement is symmetric.  VIII: hearing is intact to voice X: Uvula elevates symmetrically XI: Shoulder shrug is symmetric. XII: tongue is midline without atrophy or fasciculations.  Motor: Tone is increased, though  it is possible this is paratonia. Bulk is normal. She moves the left arm and leg spontaneously. She creeps her right arm flexed and has intermittent abnormal movements of flexion, but is able to suppress these voluntarily or I am able to suppress them. Sensory: Sensation is diminished on the right Deep Tendon Reflexes: 2+  in the biceps and patellae and ankles on the right, trace on the left Cerebellar: Does not perform   I have reviewed labs in epic and the  results pertinent to this consultation are:  Mild leukocytosis CMP-unremarkable  I have reviewed the images obtained:MRI brain 2/28- On cut 25 of DWI, I suspect that there may be some cortical ribboning in the left parieto-temporal region, also possible in the high left occipital region. This could be artifact, but may be a true finding.   Impression: 81 yo F with marked neurological decline over the past 6 weeks. Possibilities include viral encephalitis(though I would expect a faster course), CJD(can present more quickly in the elderly than younger patients), autoimmune encephalitis(such as paraneoplastic). She will also need to be evaluated for seizure.   Recommendations: 1) MRI brain w/wo contrast 2) Agree with CSF, will add fungal, vzv, viral cultures, protein 1-4-3-3/tau 3) EEG 4)  further testing pending the results of the above studies.     Roland Rack, MD [facility] [phone]  If 7pm- 7am, please page neurology on call as listed in [place].

## 2015-02-23 NOTE — Telephone Encounter (Signed)
Called to let them know that patient would not be back and that she was being admitted to the hospital.

## 2015-02-23 NOTE — Procedures (Signed)
EEG report.  Brief clinical history: : 81 yo F with marked neurological decline over the past 6 weeks.   Technique: this is a 17 channel routine scalp EEG performed at the bedside with bipolar and monopolar montages arranged in accordance to the international 10/20 system of electrode placement. One channel was dedicated to EKG recording.  The study was performed during wakefulness, drowsiness, and stage 2 sleep. No activating procedures performed.  Description: Patient spent a very limited of time in the wakeful state, with a best background that consisted of a medium amplitude, posterior dominant, well sustained, symmetric and reactive 8 Hz rhythm. Drowsiness demonstrated dropout of the alpha rhythm. Stage 2 sleep showed symmetric and synchronous sleep spindles without intermixed epileptiform discharges.  No focal or generalized epileptiform discharges noted.  No pathologic areas of slowing seen.  EKG showed sinus rhythm.  Impression: this is a normal awake and asleep EEG. Please, be aware that a normal EEG does not exclude the possibility of epilepsy.  Clinical correlation is advised.   Dorian Pod, MD

## 2015-02-24 DIAGNOSIS — R4182 Altered mental status, unspecified: Secondary | ICD-10-CM

## 2015-02-24 DIAGNOSIS — R531 Weakness: Secondary | ICD-10-CM

## 2015-02-24 DIAGNOSIS — I480 Paroxysmal atrial fibrillation: Secondary | ICD-10-CM

## 2015-02-24 DIAGNOSIS — Z515 Encounter for palliative care: Secondary | ICD-10-CM

## 2015-02-24 DIAGNOSIS — Z66 Do not resuscitate: Secondary | ICD-10-CM

## 2015-02-24 LAB — VDRL, CSF: VDRL Quant, CSF: NONREACTIVE

## 2015-02-24 LAB — HERPES SIMPLEX VIRUS(HSV) DNA BY PCR
HSV 1 DNA: NEGATIVE
HSV 2 DNA: NEGATIVE

## 2015-02-24 LAB — TROPONIN I: Troponin I: 0.06 ng/mL — ABNORMAL HIGH (ref <0.031)

## 2015-02-24 LAB — T4, FREE: Free T4: 1.97 ng/dL — ABNORMAL HIGH (ref 0.80–1.80)

## 2015-02-24 MED ORDER — LORAZEPAM 1 MG PO TABS: 1.0000 mg | ORAL_TABLET | Freq: Four times a day (QID) | ORAL | Status: DC | PRN

## 2015-02-24 MED ORDER — METOPROLOL TARTRATE 25 MG PO TABS: 25.0000 mg | ORAL_TABLET | Freq: Two times a day (BID) | ORAL | Status: DC

## 2015-02-24 MED ORDER — MORPHINE SULFATE 2 MG/ML IJ SOLN
1.0000 mg | INTRAMUSCULAR | Status: DC | PRN
  Administered 2015-02-24 – 2015-02-25 (×4): 1 mg via INTRAVENOUS
  Filled 2015-02-24 (×5): qty 1

## 2015-02-24 MED ORDER — LORAZEPAM 2 MG/ML PO CONC: 1.0000 mg | Freq: Four times a day (QID) | ORAL | Status: DC | PRN

## 2015-02-24 MED ORDER — MORPHINE SULFATE (CONCENTRATE) 10 MG/0.5ML PO SOLN
5.0000 mg | ORAL | Status: DC | PRN
  Administered 2015-02-24: 5 mg via ORAL
  Filled 2015-02-24: qty 0.5

## 2015-02-24 NOTE — Progress Notes (Signed)
Subjective: Patient awake and following minimal commands. Agitated over night but at present time is calm.   Objective: Current vital signs: BP 142/97 mmHg  Pulse 95  Temp(Src) 97 F (36.1 C) (Axillary)  Resp 20  SpO2 94% Vital signs in last 24 hours: Temp:  [97 F (36.1 C)-98.6 F (37 C)] 97 F (36.1 C) (03/23 6301) Pulse Rate:  [78-138] 95 (03/23 0611) Resp:  [16-20] 20 (03/23 0611) BP: (112-164)/(54-97) 142/97 mmHg (03/23 0611) SpO2:  [94 %-100 %] 94 % (03/23 0611)  Intake/Output from previous day: 03/22 0701 - 03/23 0700 In: 100 [P.O.:100] Out: -  Intake/Output this shift:   Nutritional status: DIET DYS 2  Neurologic Exam: General: NAD Mental Status: Alert, tract movements in the room.  Speech fluent with word finding difficulty.  Able to follow simple commands only. Cranial Nerves: II: blinks to threat bilaterally, pupils equal, round, reactive to light and accommodation III,IV, VI: ptosis not present, extra-ocular motions intact bilaterally V,VII: smile symmetric, facial light touch sensation normal bilaterally VIII: hearing normal bilaterally IX,X: uvula rises symmetrically XI: bilateral shoulder shrug XII: midline tongue extension without atrophy or fasciculations  Motor: Increased tone throughout, left arm and leg moving spontaneously with good strength.  Right arm and leg able to move antigravity but showing 3/5 strength.  Sensory: Pinprick and light touch decreased on the right Deep Tendon Reflexes:  Right: Upper Extremity   Left: Upper extremity   biceps (C-5 to C-6) 2/4   biceps (C-5 to C-6) 2/4 tricep (C7) 2/4    triceps (C7) 2/4 Brachioradialis (C6) 2/4  Brachioradialis (C6) 2/4  Lower Extremity Lower Extremity  quadriceps (L-2 to L-4) 2/4   quadriceps (L-2 to L-4) 1/4 Achilles (S1) 0/4   Achilles (S1) 0/4  t    Lab Results: Basic Metabolic Panel:  Recent Labs Lab 02/18/15 0618 02/22/15 1658 02/23/15 0720  NA 139 140  --   K 2.8* 3.8   --   CL 102 105  --   CO2 28 23  --   GLUCOSE 102* 116*  --   BUN 9 32*  --   CREATININE 0.70 1.14*  --   CALCIUM 9.2 10.3  --   MG  --   --  1.5    Liver Function Tests:  Recent Labs Lab 02/22/15 1658  AST 27  ALT 20  ALKPHOS 57  BILITOT 1.5*  PROT 7.3  ALBUMIN 3.6   No results for input(s): LIPASE, AMYLASE in the last 168 hours. No results for input(s): AMMONIA in the last 168 hours.  CBC:  Recent Labs Lab 02/22/15 1658  WBC 11.8*  NEUTROABS 9.2*  HGB 14.1  HCT 40.5  MCV 95.7  PLT 198    Cardiac Enzymes:  Recent Labs Lab 02/23/15 0720 02/23/15 1210 02/23/15 1948 02/24/15 0009  TROPONINI 0.05* 0.05* 0.05* 0.06*    Lipid Panel: No results for input(s): CHOL, TRIG, HDL, CHOLHDL, VLDL, LDLCALC in the last 168 hours.  CBG: No results for input(s): GLUCAP in the last 168 hours.  Microbiology: Results for orders placed or performed during the hospital encounter of 02/22/15  Culture, blood (routine x 2)     Status: None (Preliminary result)   Collection Time: 02/22/15  4:40 PM  Result Value Ref Range Status   Specimen Description BLOOD RIGHT WRIST  Final   Special Requests BOTTLES DRAWN AEROBIC AND ANAEROBIC 5CCS  Final   Culture   Final  BLOOD CULTURE RECEIVED NO GROWTH TO DATE CULTURE WILL BE HELD FOR 5 DAYS BEFORE ISSUING A FINAL NEGATIVE REPORT Performed at [facility]    Report Status PENDING  Incomplete  Culture, blood (routine x 2)     Status: None (Preliminary result)   Collection Time: 02/22/15  4:50 PM  Result Value Ref Range Status   Specimen Description BLOOD ARM LEFT  Final   Special Requests BOTTLES DRAWN AEROBIC AND ANAEROBIC 5CCS  Final   Culture   Final           BLOOD CULTURE RECEIVED NO GROWTH TO DATE CULTURE WILL BE HELD FOR 5 DAYS BEFORE ISSUING A FINAL NEGATIVE REPORT Performed at [facility]    Report Status PENDING  Incomplete  Urine culture     Status: None   Collection Time: 02/22/15  7:10 PM   Result Value Ref Range Status   Specimen Description URINE, CLEAN CATCH  Final   Special Requests Normal  Final   Colony Count NO GROWTH Performed at [facility]   Final   Culture NO GROWTH Performed at [facility]   Final   Report Status 02/23/2015 FINAL  Final  Gram stain     Status: None   Collection Time: 02/22/15 11:09 PM  Result Value Ref Range Status   Specimen Description CSF  Final   Special Requests TUBE 2  Final   Gram Stain   Final    CYTOSPIN SLIDE WBC PRESENT,BOTH PMN AND MONONUCLEAR NO ORGANISMS SEEN    Report Status 02/23/2015 FINAL  Final  CSF culture     Status: None (Preliminary result)   Collection Time: 02/22/15 11:10 PM  Result Value Ref Range Status   Specimen Description CSF  Final   Special Requests TUBE 2  Final   Gram Stain   Final    CYTOSPIN SLIDE WBC PRESENT,BOTH PMN AND MONONUCLEAR NO ORGANISMS SEEN Performed at [facility] Performed at [place]    Culture NO GROWTH Performed at [facility]   Final   Report Status PENDING  Incomplete  Fungus Culture with Smear     Status: None (Preliminary result)   Collection Time: 02/22/15 11:27 PM  Result Value Ref Range Status   Specimen Description CSF  Final   Special Requests NONE  Final   Fungal Smear   Final    NO YEAST OR FUNGAL ELEMENTS SEEN Performed at [facility]    Culture   Final    CULTURE IN PROGRESS FOR FOUR WEEKS Performed at [facility]    Report Status PENDING  Incomplete    Coagulation Studies: No results for input(s): LABPROT, INR in the last 72 hours.  Imaging: Dg Chest 2 View  02/22/2015   CLINICAL DATA:  Altered mental status, recent parathyroid surgery  EXAM: CHEST  2 VIEW  COMPARISON:  01/04/2009  FINDINGS: Lungs are clear.  No pleural effusion or pneumothorax.  The heart is normal in size.  Degenerative changes of the visualized thoracolumbar spine.  IMPRESSION: No evidence of acute cardiopulmonary  disease.   Electronically Signed   By: Julian Hy M.D.   On: 02/22/2015 17:59   Ct Head Wo Contrast  02/22/2015   CLINICAL DATA:  Altered mental status, recent parathyroid surgery  EXAM: CT HEAD WITHOUT CONTRAST  TECHNIQUE: Contiguous axial images were obtained from the base of the skull through the vertex without intravenous contrast.  COMPARISON:  MRI brain dated 01/23/2015  FINDINGS: No evidence of parenchymal hemorrhage or extra-axial fluid collection. No mass lesion, mass effect, or midline shift.  No CT evidence of acute infarction.  Subcortical white matter and periventricular small vessel ischemic changes.  Cerebral volume is within normal limits.  No ventriculomegaly.  The visualized paranasal sinuses are essentially clear. The mastoid air cells are unopacified.  No evidence of calvarial fracture.  IMPRESSION: No evidence of acute intracranial abnormality.  Atrophy with small vessel ischemic changes.   Electronically Signed   By: Julian Hy M.D.   On: 02/22/2015 18:21   Mr Jeri Cos RC Contrast  02/23/2015   CLINICAL DATA:  81 year old female presenting with declining mental status for the past week. Parathyroid surgery 1 week ago. Difficulty ambulating. Loss of appetite. History of hypertension and melanoma. Subsequent encounter.  EXAM: MRI HEAD WITHOUT AND WITH CONTRAST  TECHNIQUE: Multiplanar, multiecho pulse sequences of the brain and surrounding structures were obtained without and with intravenous contrast.  CONTRAST:  34mL MULTIHANCE GADOBENATE DIMEGLUMINE 529 MG/ML IV SOLN  COMPARISON:  02/22/2015 head CT. 01/31/2015 and 01/23/2015 brain MR.  FINDINGS: Exam is motion degraded.  No acute infarct. Areas of altered signal intensity on diffusion sequence felt to be related to artifact rather than infarct or result of posterior reversible encephalopathy syndrome.  Moderate small vessel disease type changes.  No intracranial hemorrhage.  No intracranial mass or abnormal enhancement.   Global atrophy without hydrocephalus.  Minimal paranasal sinus mucosal thickening.  Orbital structures grossly within normal limits.  IMPRESSION: No acute intracranial abnormality noted on this motion degraded exam.  Moderate small vessel disease type changes.   Electronically Signed   By: Genia Del M.D.   On: 02/23/2015 09:25   Dg Lumbar Puncture Fluoro Guide  02/22/2015   CLINICAL DATA:  Acute onset altered mental status for 1 week. Initial encounter.  EXAM: DIAGNOSTIC LUMBAR PUNCTURE UNDER FLUOROSCOPIC GUIDANCE  FLUOROSCOPY TIME:  Fluoroscopy Time (in minutes and seconds): 18 seconds  Number of Acquired Images:  One saved image  PROCEDURE: Informed consent was obtained from the patient's daughter prior to the procedure, including potential complications of headache, allergy, and pain. With the patient prone, the lower back was prepped with Betadine. 1% Lidocaine was used for local anesthesia. Lumbar puncture was performed at the L4-L5 level using a 20 gauge needle with return of mildly bloody and slightly hazy CSF with a very low non-measurable opening pressure. 8 mL of CSF were obtained for laboratory studies. The patient tolerated the procedure well and there were no apparent complications.  IMPRESSION: Successful lumbar puncture. Collected CSF slightly hazy and mildly serosanguineous in nature.   Electronically Signed   By: Garald Balding M.D.   On: 02/22/2015 23:14    Medications:  Scheduled: . acyclovir  600 mg Intravenous 3 times per day  . aspirin  325 mg Oral Once  . dorzolamide-timolol  1 drop Both Eyes BID  . enoxaparin (LOVENOX) injection  30 mg Subcutaneous Q24H  . feeding supplement (ENSURE)  1 Container Oral BID PC  . lactose free nutrition  237 mL Oral Q24H  . metoprolol tartrate  12.5 mg Oral BID  . multivitamin with minerals  1 tablet Oral Daily  . potassium chloride  10 mEq Oral Daily  . sodium chloride  3 mL Intravenous Q12H  . Travoprost (BAK Free)  1 drop Both Eyes QHS   . vitamin B-12  250 mcg Oral Daily    Assessment/Plan:  81 yo F with marked neurological decline  over the past 6 weeks. MRI brain shows no abnormalities, EEG showed no epileptogenic activity. Patient has no indications on imaging study nor on EEG and CSF of an acute CNS infection or seizure disorder. At present time etiology of rapid cognitive decline is unclear.  Family has noted some respiratory decline and has made patient DNR. Palliative care meeting to be arranged.   Recommend: 1) may DC acyclovir, as likelihood of recent having herpes simplex encephalitis is very low, with only 2 WBCs seen in CSF 2) palliative care meeting 3) will continue to follow.    Etta Quill PA-C [facility] [phone]  I personally participated in this patient's evaluation and management, including formulating the above clinical impression and management recommendations. We will continue to follow this patient with you.  Rush Farmer M.D. [facility] [phone]  02/24/2015, 9:56 AM

## 2015-02-24 NOTE — Progress Notes (Signed)
PATIENT DETAILS Name: [name] Age: 81 y.o. Sex: female Date of Birth: 01/29/1923 Admit Date: 02/22/2015 Admitting Physician Merton Border, MD [id number] Shawna Orleans, DO  Subjective: Very weak, and labored breathing this morning.  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 Unfortunately patient continues to deteriorate, extensive investigations including multiple MRIs, heavy metal screen, TSH, vitamin B12, RPR, cryptococcal antigen continues to be negative. EEG was negative as well. CSF shows elevated protein, Gram stain/fungal stain negative. HSV PCR from CSF currently pending. Workup for Creuzfeldt-Jakob disease pending. Irrespective of the etiology, patient has had a remarkable decline in her functional status, and is clearly deteriorating. She appears to be developing some respiratory muscle fatigue and has some labored breathing. This M.D. spoke  with multiple family members including spouse, son, daughter-in-law and daughter at bedside-since overall very poor prognosis-etiology of underlying neurological disorder remains unknown-and after reviewing patient's living will which patient's husband brought at bedside-we have decided to make her DO NOT RESUSCITATE. I have also consulted palliative care medicine for further assistance. In the meantime, we will continue with acyclovir until HSV PCR was negative. I will check HIV.   Atrial fibrillation with RVR: New onset, appreciate cardiology evaluation-rate still not optimal, continue with beta blocker but will increase dose to 25 mg twice a day. Not a candidate for anticoagulation with rapid neurological decline-and poor oral intake    Mildly elevated troponin: Likely secondary to demand ischemia. No further workup necessary as patient has rapid neurological decline. Continue aspirin for now.    Dysphagia: Secondary to underlying unknown  neurological disorder-continue dysphagia 2 diet. Family aware of risks of aspiration. We briefly discussed feeding tube-family does not want to pursue any sort of feeding tube including NG or PEG tube at this time.    History of primary hyperparathyroidism-status post parathyroidectomy: Serum calcium remains within normal limits.    Essential hypertension: Blood pressure with moderate control, continue metoprolol  Disposition: Remain inpatient  Antibiotics:  See below   Anti-infectives    Start     Dose/Rate Route Frequency Ordered Stop   02/23/15 0600  acyclovir (ZOVIRAX) 600 mg in dextrose 5 % 100 mL IVPB     600 mg 112 mL/hr over 60 Minutes Intravenous 3 times per day 02/22/15 2234     02/22/15 2115  acyclovir (ZOVIRAX) 595 mg in dextrose 5 % 100 mL IVPB     10 mg/kg  59.4 kg 111.9 mL/hr over 60 Minutes Intravenous  Once 02/22/15 2026 02/23/15 0158      DVT Prophylaxis: Prophylactic Lovenox   Code Status: Full code or DNR  Family Communication Multiple family members at bedside  Procedures:  Lumbar puncture-3/21  CONSULTS:  neurology and Palliative care medicine   Time spent 40 minutes-which includes 50% of the time with face-to-face with patient/ family and coordinating care related to the above assessment and plan.  MEDICATIONS: Scheduled Meds: . acyclovir  600 mg Intravenous 3 times per day  . aspirin  325 mg Oral Once  . dorzolamide-timolol  1 drop Both Eyes BID  . enoxaparin (LOVENOX) injection  30 mg Subcutaneous Q24H  . feeding supplement (ENSURE)  1 Container Oral BID PC  . lactose free nutrition  237 mL Oral Q24H  . metoprolol tartrate  12.5 mg Oral BID  . multivitamin with minerals  1 tablet Oral Daily  . potassium chloride  10 mEq Oral Daily  . sodium chloride  3 mL Intravenous Q12H  . Travoprost (BAK Free)  1 drop Both Eyes QHS  . vitamin B-12  250 mcg Oral Daily   Continuous Infusions:  PRN Meds:.acetaminophen, LORazepam, metoprolol,  morphine injection, morphine CONCENTRATE, ondansetron **OR** ondansetron (ZOFRAN) IV    PHYSICAL EXAM: Vital signs in last 24 hours: Filed Vitals:   02/24/15 0221 02/24/15 0611 02/24/15 1130 02/24/15 1417  BP: 123/84 142/97 141/75 155/77  Pulse: 108 95 110 110  Temp: 97.3 F (36.3 C) 97 F (36.1 C)  98.8 F (37.1 C)  TempSrc: Oral Axillary  Oral  Resp: 20 20  18   SpO2: 100% 94%  98%    Weight change:  There were no vitals filed for this visit. There is no weight on file to calculate BMI.   Gen Exam: Awake and mostly alert-but very weak. Labored breathing.  Neck: Supple, No JVD.   Chest: B/L Clear.   CVS: S1 S2 Regular, no murmurs.  Abdomen: soft, BS +, non tender, non distended.  Extremities: no edema, lower extremities warm to touch. Neurologic: Generalized weakness-suspect approximately 2-3/5 all over Skin: No Rash.   Wounds: N/A.    Intake/Output from previous day:  Intake/Output Summary (Last 24 hours) at 02/24/15 1603 Last data filed at 02/23/15 1900  Gross per 24 hour  Intake     50 ml  Output      0 ml  Net     50 ml     LAB RESULTS: CBC  Recent Labs Lab 02/22/15 1658  WBC 11.8*  HGB 14.1  HCT 40.5  PLT 198  MCV 95.7  MCH 33.3  MCHC 34.8  RDW 14.3  LYMPHSABS 1.4  MONOABS 1.1*  EOSABS 0.1  BASOSABS 0.0    Chemistries   Recent Labs Lab 02/18/15 0618 02/22/15 1658 02/23/15 0720  NA 139 140  --   K 2.8* 3.8  --   CL 102 105  --   CO2 28 23  --   GLUCOSE 102* 116*  --   BUN 9 32*  --   CREATININE 0.70 1.14*  --   CALCIUM 9.2 10.3  --   MG  --   --  1.5    CBG: No results for input(s): GLUCAP in the last 168 hours.  GFR Estimated Creatinine Clearance: 27.7 mL/min (by C-G formula based on Cr of 1.14).  Coagulation profile No results for input(s): INR, PROTIME in the last 168 hours.  Cardiac Enzymes  Recent Labs Lab 02/23/15 1210 02/23/15 1948 02/24/15 0009  TROPONINI 0.05* 0.05* 0.06*    Invalid input(s): POCBNP No  results for input(s): DDIMER in the last 72 hours. No results for input(s): HGBA1C in the last 72 hours. No results for input(s): CHOL, HDL, LDLCALC, TRIG, CHOLHDL, LDLDIRECT in the last 72 hours.  Recent Labs  02/23/15 1038  TSH 0.259*    Recent Labs  02/23/15 1038  VITAMINB12 828   No results for input(s): LIPASE, AMYLASE in the last 72 hours.  Urine Studies No results for input(s): UHGB, CRYS in the last 72 hours.  Invalid input(s): UACOL, UAPR, USPG, UPH, UTP, UGL, UKET, UBIL, UNIT, UROB, ULEU, UEPI, UWBC, URBC, UBAC, CAST, UCOM, BILUA  MICROBIOLOGY: Recent Results (from the past 240 hour(s))  Culture, blood (routine x 2)     Status: None (Preliminary result)   Collection Time: 02/22/15  4:40 PM  Result Value Ref Range Status   Specimen Description BLOOD RIGHT WRIST  Final   Special Requests BOTTLES DRAWN  AEROBIC AND ANAEROBIC 5CCS  Final   Culture   Final           BLOOD CULTURE RECEIVED NO GROWTH TO DATE CULTURE WILL BE HELD FOR 5 DAYS BEFORE ISSUING A FINAL NEGATIVE REPORT Performed at [facility]    Report Status PENDING  Incomplete  Culture, blood (routine x 2)     Status: None (Preliminary result)   Collection Time: 02/22/15  4:50 PM  Result Value Ref Range Status   Specimen Description BLOOD ARM LEFT  Final   Special Requests BOTTLES DRAWN AEROBIC AND ANAEROBIC 5CCS  Final   Culture   Final           BLOOD CULTURE RECEIVED NO GROWTH TO DATE CULTURE WILL BE HELD FOR 5 DAYS BEFORE ISSUING A FINAL NEGATIVE REPORT Performed at [facility]    Report Status PENDING  Incomplete  Urine culture     Status: None   Collection Time: 02/22/15  7:10 PM  Result Value Ref Range Status   Specimen Description URINE, CLEAN CATCH  Final   Special Requests Normal  Final   Colony Count NO GROWTH Performed at [facility]   Final   Culture NO GROWTH Performed at [facility]   Final   Report Status 02/23/2015 FINAL  Final  Gram stain      Status: None   Collection Time: 02/22/15 11:09 PM  Result Value Ref Range Status   Specimen Description CSF  Final   Special Requests TUBE 2  Final   Gram Stain   Final    CYTOSPIN SLIDE WBC PRESENT,BOTH PMN AND MONONUCLEAR NO ORGANISMS SEEN    Report Status 02/23/2015 FINAL  Final  CSF culture     Status: None (Preliminary result)   Collection Time: 02/22/15 11:10 PM  Result Value Ref Range Status   Specimen Description CSF  Final   Special Requests TUBE 2  Final   Gram Stain   Final    CYTOSPIN SLIDE WBC PRESENT,BOTH PMN AND MONONUCLEAR NO ORGANISMS SEEN Performed at [facility] Performed at [facility]    Culture   Final    NO GROWTH 1 DAY Performed at [facility]    Report Status PENDING  Incomplete  Fungus Culture with Smear     Status: None (Preliminary result)   Collection Time: 02/22/15 11:27 PM  Result Value Ref Range Status   Specimen Description CSF  Final   Special Requests NONE  Final   Fungal Smear   Final    NO YEAST OR FUNGAL ELEMENTS SEEN Performed at [facility]    Culture   Final    CULTURE IN PROGRESS FOR FOUR WEEKS Performed at [facility]    Report Status PENDING  Incomplete    RADIOLOGY STUDIES/RESULTS: Dg Chest 2 View  02/22/2015   CLINICAL DATA:  Altered mental status, recent parathyroid surgery  EXAM: CHEST  2 VIEW  COMPARISON:  01/04/2009  FINDINGS: Lungs are clear.  No pleural effusion or pneumothorax.  The heart is normal in size.  Degenerative changes of the visualized thoracolumbar spine.  IMPRESSION: No evidence of acute cardiopulmonary disease.   Electronically Signed   By: Julian Hy M.D.   On: 02/22/2015 17:59   Ct Head Wo Contrast  02/22/2015   CLINICAL DATA:  Altered mental status, recent parathyroid surgery  EXAM: CT HEAD WITHOUT CONTRAST  TECHNIQUE: Contiguous axial images were obtained from the base of the skull  through the vertex without intravenous contrast.  COMPARISON:  MRI  brain dated 01/23/2015  FINDINGS: No evidence of parenchymal hemorrhage or extra-axial fluid collection. No mass lesion, mass effect, or midline shift.  No CT evidence of acute infarction.  Subcortical white matter and periventricular small vessel ischemic changes.  Cerebral volume is within normal limits.  No ventriculomegaly.  The visualized paranasal sinuses are essentially clear. The mastoid air cells are unopacified.  No evidence of calvarial fracture.  IMPRESSION: No evidence of acute intracranial abnormality.  Atrophy with small vessel ischemic changes.   Electronically Signed   By: Julian Hy M.D.   On: 02/22/2015 18:21   Mr Brain Wo Contrast  01/31/2015   CLINICAL DATA:  Right-sided numbness over the last month which has increased in the last week.  EXAM: MRI HEAD WITHOUT CONTRAST  TECHNIQUE: Multiplanar, multiecho pulse sequences of the brain and surrounding structures were obtained without intravenous contrast.  COMPARISON:  MRI of the brain 01/23/2015 at [facility].  FINDINGS: No acute infarct, hemorrhage, or mass lesion is present. Moderate periventricular and subcortical white matter changes bilaterally are stable. The ventricles are proportionate to the degree of atrophy. No significant extraaxial fluid collection is present.  Flow is present in the major intracranial arteries. The patient is status post bilateral lens replacements. The globes and orbits are otherwise intact. The paranasal sinuses and mastoid air cells are clear.  Skullbase is unremarkable. Midline structures are within normal limits.  IMPRESSION: Or  1. No acute intracranial abnormality or significant interval change. 2. Stable atrophy and diffuse white matter disease. This likely reflects the sequelae of chronic microvascular ischemia.   Electronically Signed   By: San Morelle M.D.   On: 01/31/2015 14:14   Mr Jeri Cos KD Contrast  02/23/2015   CLINICAL DATA:  81 year old female presenting with declining  mental status for the past week. Parathyroid surgery 1 week ago. Difficulty ambulating. Loss of appetite. History of hypertension and melanoma. Subsequent encounter.  EXAM: MRI HEAD WITHOUT AND WITH CONTRAST  TECHNIQUE: Multiplanar, multiecho pulse sequences of the brain and surrounding structures were obtained without and with intravenous contrast.  CONTRAST:  58mL MULTIHANCE GADOBENATE DIMEGLUMINE 529 MG/ML IV SOLN  COMPARISON:  02/22/2015 head CT. 01/31/2015 and 01/23/2015 brain MR.  FINDINGS: Exam is motion degraded.  No acute infarct. Areas of altered signal intensity on diffusion sequence felt to be related to artifact rather than infarct or result of posterior reversible encephalopathy syndrome.  Moderate small vessel disease type changes.  No intracranial hemorrhage.  No intracranial mass or abnormal enhancement.  Global atrophy without hydrocephalus.  Minimal paranasal sinus mucosal thickening.  Orbital structures grossly within normal limits.  IMPRESSION: No acute intracranial abnormality noted on this motion degraded exam.  Moderate small vessel disease type changes.   Electronically Signed   By: Genia Del M.D.   On: 02/23/2015 09:25   Mr Cervical Spine Wo Contrast  02/17/2015   CLINICAL DATA:  Right arm weakness following recent parathyroidectomy.  EXAM: MRI CERVICAL SPINE WITHOUT CONTRAST  TECHNIQUE: Multiplanar, multisequence MR imaging of the cervical spine was performed. No intravenous contrast was administered.  COMPARISON:  None.  FINDINGS: Images are mildly to moderately degraded by motion artifact, particularly axial images. This limits detailed evaluation of degenerative changes as well as of the soft tissue changes in the lower right neck.  There is slight reversal of the normal cervical lordosis. There is no listhesis. Vertebral body heights are preserved. Vertebral bone marrow signal is  mildly to moderately heterogeneous diffusely. No vertebral marrow edema is identified. A 1 cm  hemangioma is noted in the C7 vertebral body. 8 mm lesion partially visualized in the T3 vertebral body also likely represents a hemangioma.  There is mild-to-moderate disc space narrowing at C4-5 and C5-6. Craniocervical junction is unremarkable. Cervical spinal cord is normal in caliber without definite signal abnormality identified. Anterior neck soft tissue edema and more focal signal changes about/posterior to the right thyroid lobe likely reflect postoperative changes from recent parathyroidectomy, possibly with a small underlying hematoma.  C2-3: Mild bilateral facet arthrosis without disc herniation or stenosis.  C3-4: Shallow disc bulging and mild uncovertebral spurring may result in minimal left neural foraminal narrowing without spinal stenosis.  C4-5: Broad-based posterior disc osteophyte complex results in mild-to-moderate bilateral neural foraminal stenosis without spinal stenosis.  C5-6: Broad-based posterior disc osteophyte complex results in likely mild right and moderate left neural foraminal stenosis without spinal stenosis.  C6-7:  No disc herniation or significant stenosis identified.  C7-T1:  Negative.  IMPRESSION: 1. Motion degraded examination delete that. Multilevel cervical disc degeneration with mild-to-moderate neural foraminal stenosis as above. No spinal stenosis. 2. Soft tissue changes in the lower neck consistent with recent parathyroidectomy.   Electronically Signed   By: Logan Bores   On: 02/17/2015 15:19   Nm Parathyroid W/spect  01/26/2015   CLINICAL DATA:  Hyperparathyroidism. Primary hyperparathyroidism. PTH equal 71  EXAM: NM PARATHYROID SCINTIGRAPHY AND SPECT IMAGING  TECHNIQUE: Following intravenous administration of radiopharmaceutical, early and 2-hour delayed planar images were obtained in the anterior projection. Delayed triplanar SPECT images were also obtained at 2 hours.  RADIOPHARMACEUTICALS:  26.4 mCiTc-51m Sestamibi IV  COMPARISON:  None.  FINDINGS: There is a  focus of residual activity in the right aspect of the thyroid bed on the planar imaging. SPECT imaging demonstrates a distinct focus of radiotracer accumulation localizing to the superior aspect of the right lobe of the thyroid gland.  IMPRESSION: Focal activity localizing to the superior aspect of the RIGHT lobe of thyroid gland is most consists with parathyroid adenoma.   Electronically Signed   By: Suzy Bouchard M.D.   On: 01/26/2015 14:19   Dg Lumbar Puncture Fluoro Guide  02/22/2015   CLINICAL DATA:  Acute onset altered mental status for 1 week. Initial encounter.  EXAM: DIAGNOSTIC LUMBAR PUNCTURE UNDER FLUOROSCOPIC GUIDANCE  FLUOROSCOPY TIME:  Fluoroscopy Time (in minutes and seconds): 18 seconds  Number of Acquired Images:  One saved image  PROCEDURE: Informed consent was obtained from the patient's daughter prior to the procedure, including potential complications of headache, allergy, and pain. With the patient prone, the lower back was prepped with Betadine. 1% Lidocaine was used for local anesthesia. Lumbar puncture was performed at the L4-L5 level using a 20 gauge needle with return of mildly bloody and slightly hazy CSF with a very low non-measurable opening pressure. 8 mL of CSF were obtained for laboratory studies. The patient tolerated the procedure well and there were no apparent complications.  IMPRESSION: Successful lumbar puncture. Collected CSF slightly hazy and mildly serosanguineous in nature.   Electronically Signed   By: Garald Balding M.D.   On: 02/22/2015 23:14    Oren Binet, MD  [facility] Pager:[phone]  If 7PM-7AM, please contact night-coverage [email or web address] Password [email or web address] 02/24/2015, 4:03 PM   LOS: 2 days

## 2015-02-24 NOTE — Consult Note (Signed)
Patient [name]      DOB: 1923/02/03      NSQ:[id number]     Consult Note from the Palliative Medicine Team at [facility] Requested by: Dr Sloan Leiter     PCP: Drema Pry, DO Reason for Consultation: Clarification of [place] and options    Phone Number:[phone]  Assessment of patients Current state:   Continued physical, functional and cognitive decline over the past several months 2/2 to multiple co-morbid ites.  Family faced with advanced directive decisions and anticipatory care needs.   Consult is for review of medical treatment options, clarification of goals of care and end of life issues, disposition and options, and symptom recommendation.  This NP Wadie Lessen reviewed medical records, received report from team, assessed the patient and then meet at the patient's bedside along with her husband and two daughters  to discuss diagnosis, prognosis, GOC, EOL wishes disposition and options.  A detailed discussion was had today regarding advanced directives.  Concepts specific to code status, artifical feeding and hydration, continued IV antibiotics and rehospitalization was had.  The difference between a aggressive medical intervention path  and a palliative comfort care path for this patient at this time was had.  Values and goals of care important to patient and family were attempted to be elicited.  Concept of Hospice and Palliative Care were discussed  Natural trajectory and expectations at EOL were discussed.  Questions and concerns addressed.  Hard Choices booklet left for review. Family encouraged to call with questions or concerns.  PMT will continue to support holistically.   Goals of Care: 1.  Code Status: DNR/DNI   2. Scope of Treatment: For now continue current medical interventions and treat the treatable.  Family is waiting for all diagnostics results before making a decisions to shift to comfort care.  They tell me there are still outstanding results   ([employer] [facility] [employer]) Husband can clearly state his desire for dignity and  comfort for his wife but at this time cannot make a complete shift to a comfort path.  PMT will continue to support hoslitically  3. Disposition:  Pending outcomes   4. Psychosocial:  Emotional support offered to family at bedside.  Husband is tearful as he shares stories of their long love relationship, 68 years.  He is able to verbalize understanding of over poor prognosis, and that her time "is limited".  Will continue to suport holistically  Patient Documents Completed or Given: Document Given Completed  Advanced Directives Pkt    MOST X   DNR  X  Gone from My Sight    Hard Choices X     Brief HPI:  81 year old female patient with history of HTN, LBBB, MVP, anxiety & depression, primary hyperparathyroidism s/p parathyroidectomy 02/16/15, discharged from rehabilitation facility on 02/18/15, seen by OP neurology on 02/04/15 for sensory ataxia and inability to stand independently, returned to her neurologist for evaluation on 3/21 with history of progressive neurological and functional decline over the last 3 weeks (more so in the last week), inability to swallow solids or liquids by mouth, inability to ambulate even with a walker. Neurologist noticed right sided weakness, right visual field neglect, right sided nystagmus, neck rigidity, rigidity of right extremities. Due to her dramatic deterioration, she was sent to the ED for further evaluation including concerns for viral meningitis/encephalitis versus other etiology. She was evaluated extensively as outpatient with MRI brain, thiamine, heavy metals, ESR, RPR, ethanol, TSH and B-12 which were  reportedly normal. She was supposed to have EMG studies on 3/21 which were obviously canceled. Hospitalized for further evaluation. Neurology consulted, continued decline since admission   GYK:ZLDJTT to illicit due to decreased cognition   PMH:  Past Medical History  Diagnosis  Date  . Anxiety   . Hypertension   . Glaucoma 12/31/2009  . ANXIETY 12/31/2009  . CHEST PAIN 12/08/2004  . FATIGUE 01/09/2011  . HYPERTENSION 12/31/2009  . LEFT BUNDLE BRANCH BLOCK 01/03/2010  . MALIGNANT MELANOMA, HX OF 01/03/2010  . MITRAL VALVE PROLAPSE 12/31/2009  . Raynaud's phenomenon   . Anemia     Macrocystosis  . Depression   . Hyperparathyroidism, primary   . Melanoma     "back"  . Basal cell carcinoma     "nose"  . Squamous carcinoma     RLE; chest"  . CAD (coronary artery disease)     cath 12/2004 Dr. Rex Kras, 50% mid LAD, no other significant coronary dx, EF normal     PSH: Past Surgical History  Procedure Laterality Date  . Melanoma excision      "off my back"  . Parathyroidectomy Right 02/16/2015    minimally invasive  . Tonsillectomy  1934  . Breast lumpectomy Left 1950  . Basal cell carcinoma excision    . Squamous cell carcinoma excision    . Eye surgery Right 1960    "rare retina problem"  . Parathyroidectomy N/A 02/16/2015    Procedure: RIGHT SUPERIOR PARATHYROIDECTOMY;  Surgeon: Armandina Gemma, MD;  Location: [facility];  Service: General;  Laterality: N/A;   I have reviewed the [place] and SH and  If appropriate update it with new information. Allergies  Allergen Reactions  . Codeine Other (See Comments)    Unk   Scheduled Meds: . acyclovir  600 mg Intravenous 3 times per day  . aspirin  325 mg Oral Once  . dorzolamide-timolol  1 drop Both Eyes BID  . enoxaparin (LOVENOX) injection  30 mg Subcutaneous Q24H  . feeding supplement (ENSURE)  1 Container Oral BID PC  . lactose free nutrition  237 mL Oral Q24H  . metoprolol tartrate  12.5 mg Oral BID  . multivitamin with minerals  1 tablet Oral Daily  . potassium chloride  10 mEq Oral Daily  . sodium chloride  3 mL Intravenous Q12H  . Travoprost (BAK Free)  1 drop Both Eyes QHS  . vitamin B-12  250 mcg Oral Daily   Continuous Infusions:  PRN Meds:.acetaminophen, LORazepam, metoprolol, morphine injection, morphine  CONCENTRATE, ondansetron **OR** ondansetron (ZOFRAN) IV    BP 141/75 mmHg  Pulse 110  Temp(Src) 97 F (36.1 C) (Axillary)  Resp 20  SpO2 94%   PPS:30 % at best   Intake/Output Summary (Last 24 hours) at 02/24/15 1349 Last data filed at 02/23/15 1900  Gross per 24 hour  Intake     50 ml  Output      0 ml  Net     50 ml    Physical Exam:  General: ill appearing, weak and frail HEENT:  Dry buccal membranes Chest:  Decreased in bases CVS: tachycardic Neuro: open eyes, verbally denies pain at this time  Labs: CBC    Component Value Date/Time   WBC 11.8* 02/22/2015 1658   WBC 7.5 06/03/2014 1334   RBC 4.23 02/22/2015 1658   RBC 4.53 02/18/2015 0618   RBC 4.31 06/03/2014 1334   HGB 14.1 02/22/2015 1658   HGB 14.0 06/03/2014 1334   HCT 40.5  02/22/2015 1658   HCT 41.1 06/03/2014 1334   PLT 198 02/22/2015 1658   PLT 190 06/03/2014 1334   MCV 95.7 02/22/2015 1658   MCV 95 06/03/2014 1334   MCH 33.3 02/22/2015 1658   MCH 32.5 06/03/2014 1334   MCHC 34.8 02/22/2015 1658   MCHC 34.1 06/03/2014 1334   RDW 14.3 02/22/2015 1658   RDW 14.1 06/03/2014 1334   LYMPHSABS 1.4 02/22/2015 1658   LYMPHSABS 1.3 06/03/2014 1334   MONOABS 1.1* 02/22/2015 1658   EOSABS 0.1 02/22/2015 1658   EOSABS 0.2 06/03/2014 1334   BASOSABS 0.0 02/22/2015 1658   BASOSABS 0.0 06/03/2014 1334    BMET    Component Value Date/Time   NA 140 02/22/2015 1658   NA 139 06/03/2014 1336   K 3.8 02/22/2015 1658   K 3.9 06/03/2014 1336   CL 105 02/22/2015 1658   CL 96* 06/03/2014 1336   CO2 23 02/22/2015 1658   CO2 28 06/03/2014 1336   GLUCOSE 116* 02/22/2015 1658   GLUCOSE 99 06/03/2014 1336   BUN 32* 02/22/2015 1658   BUN 14 06/03/2014 1336   CREATININE 1.14* 02/22/2015 1658   CREATININE 1.0 06/03/2014 1336   CALCIUM 10.3 02/22/2015 1658   CALCIUM 9.8 06/03/2014 1336   CALCIUM TEST NOT PERFORMED 06/05/2012 0808   GFRNONAA 41* 02/22/2015 1658   GFRAA 48* 02/22/2015 1658    CMP      Component Value Date/Time   NA 140 02/22/2015 1658   NA 139 06/03/2014 1336   K 3.8 02/22/2015 1658   K 3.9 06/03/2014 1336   CL 105 02/22/2015 1658   CL 96* 06/03/2014 1336   CO2 23 02/22/2015 1658   CO2 28 06/03/2014 1336   GLUCOSE 116* 02/22/2015 1658   GLUCOSE 99 06/03/2014 1336   BUN 32* 02/22/2015 1658   BUN 14 06/03/2014 1336   CREATININE 1.14* 02/22/2015 1658   CREATININE 1.0 06/03/2014 1336   CALCIUM 10.3 02/22/2015 1658   CALCIUM 9.8 06/03/2014 1336   CALCIUM TEST NOT PERFORMED 06/05/2012 0808   PROT 7.3 02/22/2015 1658   PROT 8.0 06/03/2014 1336   ALBUMIN 3.6 02/22/2015 1658   AST 27 02/22/2015 1658   AST 21 06/03/2014 1336   ALT 20 02/22/2015 1658   ALT 18 06/03/2014 1336   ALKPHOS 57 02/22/2015 1658   ALKPHOS 57 06/03/2014 1336   BILITOT 1.5* 02/22/2015 1658   BILITOT 0.70 06/03/2014 1336   GFRNONAA 41* 02/22/2015 1658   GFRAA 48* 02/22/2015 1658     Time In Time Out Total Time Spent with Patient Total Overall Time  1330 1455 75 min 85 min    Greater than 50%  of this time was spent counseling and coordinating care related to the above assessment and plan.   Wadie Lessen NP  Palliative Medicine Team Team Phone # [phone] Pager [phone]  Discussed with Dr Sloan Leiter

## 2015-02-24 NOTE — Progress Notes (Signed)
    Subjective:  Patient unable to provide any substantial history. She denies chest pain.  Objective:  Vital Signs in the last 24 hours: Temp:  [97 F (36.1 C)-98.8 F (37.1 C)] 98.1 F (36.7 C) (03/23 1716) Pulse Rate:  [95-138] 114 (03/23 1716) Resp:  [16-20] 20 (03/23 1716) BP: (112-164)/(75-97) 157/81 mmHg (03/23 1716) SpO2:  [94 %-100 %] 95 % (03/23 1716)  Intake/Output from previous day: 03/22 0701 - 03/23 0700 In: 100 [P.O.:100] Out: -   Physical Exam: Pt is awake and alert, difficulty with word finding and aphasia, elderly woman NAD HEENT: normal Neck: JVP - normal Lungs: CTA bilaterally CV: irregular without murmur or gallop Abd: soft, NT, Positive BS, no hepatomegaly Ext: no C/C/E, distal pulses intact and equal Skin: warm/dry no rash   Lab Results:  Recent Labs  02/22/15 1658  WBC 11.8*  HGB 14.1  PLT 198    Recent Labs  02/22/15 1658  NA 140  K 3.8  CL 105  CO2 23  GLUCOSE 116*  BUN 32*  CREATININE 1.14*    Recent Labs  02/23/15 1948 02/24/15 0009  TROPONINI 0.05* 0.06*    Tele: Atrial fibrillation, heart rate approximately 90-110 bpm.  Assessment/Plan:  Atrial fibrillation with RVR: Heart rate reasonably controlled on metoprolol. Patient is not a candidate for anticoagulation. Her neurologic status is the primary issue. Notes reviewed from the palliative medicine team. Will continue to follow with you to help with her heart rate control.   Sherren Mocha, M.D. 02/24/2015, 6:28 PM

## 2015-02-24 NOTE — Progress Notes (Signed)
Speech Language Pathology Treatment: Dysphagia  Patient Details Name: [name] MRN: [id number] DOB: 1923/10/04 Today's Date: 02/24/2015 Time: 8676-1950 SLP Time Calculation (min) (ACUTE ONLY): 13 min  Assessment / Plan / Recommendation Clinical Impression  Pt seen for PO tolerance. RN reports pt has been orally holding PO, and husband also shares little to no intake since initial assessment. Pt is lethargic and able to be aroused for fleeting periods of time for focused attention with Max cues. Attempts at thin liquid and pureed solids were met with oral acceptance x1 of straw, but then pt was unable to obtain any liquid. SLP provided education to family regarding risks for PO intake given current mentation, and encouraged them to hold POs at this time until she may become more alert (note per palliative consult that they are not yet ready to transition to full comfort care). SLP to continue to follow,   HPI HPI: 81 yr old admitted from Neurologist (Dr. Posey Pronto with [facility]) office with c/o declining physical and mental status x1 week. She underwent parathyroid surgery 3/15 and has been "declining" since then.Per notes tt is normally alert, sitting up, and able to converse. MRI no acute intracranial abnormality noted on this motion degraded exam. CXR no evidence of acute cardiopulmonary disease.   Pertinent Vitals Pain Assessment: Faces Faces Pain Scale: No hurt  SLP Plan  Continue with current plan of care    Recommendations Diet recommendations: Dysphagia 2 (fine chop);Thin liquid Liquids provided via: Cup;No straw Medication Administration: Crushed with puree Supervision: Patient able to self feed;Full supervision/cueing for compensatory strategies Compensations: Slow rate;Small sips/bites Postural Changes and/or Swallow Maneuvers: Seated upright 90 degrees       Oral Care Recommendations: Oral care Q4 per protocol Follow up Recommendations:  (tba) Plan: Continue with current plan  of care    Germain Osgood, M.A. CCC-SLP [phone]  Germain Osgood 02/24/2015, 4:36 PM

## 2015-02-24 NOTE — Progress Notes (Signed)
Pharmacy consulted to dose TPN due to intolerance to enteral feeding.   Pt with poor appetite.  Pt on dysphagia 2 diet.  Gut functioning. TPN is not indicated in patients with functioning gut.  It places patient at risk for central line infections, refeeding syndrome, and hyperglycemia.  It also creates an ethical dilemma with end of life issues.  I discussed with Dr. Sloan Leiter.  DC TPN consult.  Consider tube feedings if desired by family Thanks Eudelia Bunch, Pharm.D. [phone] 02/24/2015 1:09 PM

## 2015-02-24 NOTE — Progress Notes (Signed)
Pt holding PO meds in her mouth even with multiple cues to swallow.  Pt's mouth swabbed out.  SLP to see her.  Will continue to monitor. Cori Razor, RN

## 2015-02-25 ENCOUNTER — Telehealth: Payer: Self-pay | Admitting: *Deleted

## 2015-02-25 ENCOUNTER — Telehealth: Payer: Self-pay | Admitting: Internal Medicine

## 2015-02-25 DIAGNOSIS — R0600 Dyspnea, unspecified: Secondary | ICD-10-CM

## 2015-02-25 DIAGNOSIS — R451 Restlessness and agitation: Secondary | ICD-10-CM

## 2015-02-25 LAB — VARICELLA-ZOSTER BY PCR: Varicella-Zoster, PCR: NEGATIVE

## 2015-02-25 LAB — HIV ANTIBODY (ROUTINE TESTING W REFLEX): HIV Screen 4th Generation wRfx: NONREACTIVE

## 2015-02-25 MED ORDER — SCOPOLAMINE 1 MG/3DAYS TD PT72
1.0000 | MEDICATED_PATCH | TRANSDERMAL | Status: DC
  Administered 2015-02-25: 1.5 mg via TRANSDERMAL
  Filled 2015-02-25: qty 1

## 2015-02-25 MED ORDER — LORAZEPAM 2 MG/ML IJ SOLN: 1.0000 mg | Freq: Four times a day (QID) | INTRAMUSCULAR | Status: DC | PRN

## 2015-02-25 MED ORDER — MORPHINE SULFATE 2 MG/ML IJ SOLN: 1.0000 mg | INTRAMUSCULAR | Status: DC | PRN

## 2015-02-25 MED ORDER — MORPHINE SULFATE (CONCENTRATE) 10 MG/0.5ML PO SOLN
10.0000 mg | Freq: Three times a day (TID) | ORAL | Status: DC
  Administered 2015-02-25 – 2015-02-26 (×3): 10 mg via ORAL
  Filled 2015-02-25 (×2): qty 0.5

## 2015-02-25 MED ORDER — MORPHINE SULFATE (CONCENTRATE) 10 MG/0.5ML PO SOLN
10.0000 mg | ORAL | Status: DC | PRN
  Administered 2015-02-26: 10 mg via ORAL
  Filled 2015-02-25 (×2): qty 0.5

## 2015-02-25 MED ORDER — LORAZEPAM 2 MG/ML IJ SOLN
1.0000 mg | INTRAMUSCULAR | Status: DC | PRN
  Administered 2015-02-25: 1 mg via INTRAVENOUS
  Filled 2015-02-25: qty 1

## 2015-02-25 NOTE — Telephone Encounter (Signed)
Stanly called to ask if you would be the attending physician for pt. She is at [facility]. Tried to find [place] and by the time I got back to phone, she had hung up. She stated they are down to the "wire" Address: [address] [place], [place] [address]  [phone]  I hope this helps.

## 2015-02-25 NOTE — Telephone Encounter (Signed)
Harmon Pier called from [facility] [phone]) stating the pt is transferring to [facility] for end of life care and the PCP has an option of being the attending physician for the pt or they can defer care to their physicians.  She stated to leave a message on her voicemail as she has to help another family. I called Harmon Pier and left a detailed message stating per Jenny Reichmann Dr Shawna Orleans asked if he could do co-management for the pt.  I advised she call the office with any questions.

## 2015-02-25 NOTE — Progress Notes (Signed)
PATIENT DETAILS Name: [name] Age: 81 y.o. Sex: female Date of Birth: 1922-12-22 Admit Date: 02/22/2015 Admitting Physician Merton Border, MD VVO:[id number] Shawna Orleans, DO  Subjective: Continues to be very weak-breathing is labored. Multiple family members at bedside.  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 Unfortunately patient continues to deteriorate, extensive investigations including multiple MRIs, heavy metal screen, TSH, vitamin B12, RPR, cryptococcal antigen continues to be negative. EEG was negative as well. CSF shows elevated protein, Gram stain/fungal stain negative. HSV PCR from CSF currently pending. Workup for Creuzfeldt-Jakob disease pending. Irrespective of the etiology, patient has had a remarkable decline in her functional status, and is clearly deteriorating. She has now started to develop some respiratory muscle fatigue and has some labored breathing. This M.D. spoke  with multiple family members including spouse, son, daughter-in-law and daughter at bedside on 3/23-since overall very poor prognosis-etiology of underlying neurological disorder remains unknown-and after reviewing patient's living will which patient's husband brought at bedside-we have decided to make her DO NOT RESUSCITATE.Palliative care medicine also consulted. Today, family seems more accepting of patient's decline, and now want to focus more on comfort.  Will schedule roxanol, add prn IV Ativan and Morphine.Suspect will best benefit from transfer to residential hospice at some point. Will stop Acyclovir, family ok with this.   Atrial fibrillation with RVR: New onset, rate controlled with Metoprolol. Cardiology was consulted. Not a candidate for anticoagulation with rapid neurological decline-and poor oral intake. Will stop telemetry monitoring and stop Metoprolol as family now wishing to focus on comfort.    Mildly elevated troponin: Likely secondary to demand ischemia. No further workup necessary as patient has rapid neurological decline. Not a candidate for further work up. Stop ASA-as hardly any PO intake and family wishing to focus on comfort.    Dysphagia: Secondary to underlying unknown neurological disorder-continue dysphagia 2 diet. Family aware of risks of aspiration. We briefly discussed feeding tube-family does not want to pursue any sort of feeding tube including NG or PEG tube at this time.    History of primary hyperparathyroidism-status post parathyroidectomy: Serum calcium remains within normal limits.    Essential hypertension: Monitor-will stop all antihypertensives, given continued decline and focus on comfort.   Disposition: Remain inpatient-suspect residential hospice at some point in the next few days  Antibiotics:  See below   Anti-infectives    Start     Dose/Rate Route Frequency Ordered Stop   02/23/15 0600  acyclovir (ZOVIRAX) 600 mg in dextrose 5 % 100 mL IVPB  Status:  Discontinued     600 mg 112 mL/hr over 60 Minutes Intravenous 3 times per day 02/22/15 2234 02/25/15 0822   02/22/15 2115  acyclovir (ZOVIRAX) 595 mg in dextrose 5 % 100 mL IVPB     10 mg/kg  59.4 kg 111.9 mL/hr over 60 Minutes Intravenous  Once 02/22/15 2026 02/23/15 0158      DVT Prophylaxis: Not needed as comfort care  Code Status:  DNR  Family Communication Multiple family members at bedside  Procedures:  Lumbar puncture-3/21  CONSULTS:  neurology and Palliative care medicine    MEDICATIONS: Scheduled Meds: . aspirin  325 mg Oral Once  . dorzolamide-timolol  1 drop Both Eyes BID  . feeding supplement (ENSURE)  1 Container Oral BID PC  . lactose free nutrition  237 mL Oral Q24H  . morphine CONCENTRATE  10 mg Oral 3 times per day  .  Travoprost (BAK Free)  1 drop Both Eyes QHS   Continuous Infusions:  PRN Meds:.acetaminophen, LORazepam, LORazepam, morphine injection,  morphine CONCENTRATE, [DISCONTINUED] ondansetron **OR** ondansetron (ZOFRAN) IV    PHYSICAL EXAM: Vital signs in last 24 hours: Filed Vitals:   02/24/15 2036 02/24/15 2255 02/25/15 0152 02/25/15 0604  BP:  126/51 169/87 125/54  Pulse:  101 105 102  Temp:  98.2 F (36.8 C) 98.6 F (37 C) 97.2 F (36.2 C)  TempSrc:  Oral Axillary Axillary  Resp:  20 21 22   Height: 5\' 3"  (1.6 m)     Weight: 59.421 kg (131 lb)     SpO2:  97% 95% 93%    Weight change:  Filed Weights   02/24/15 2036  Weight: 59.421 kg (131 lb)   Body mass index is 23.21 kg/(m^2).   Gen Exam: Awake and but very lethargic. Labored breathing.  Neck: Supple, No JVD.   Chest: B/L Clear.   CVS: S1 S2 Regular, no murmurs.  Abdomen: soft, BS +, non tender, non distended.  Extremities: no edema, lower extremities warm to touch. Neurologic: gen weakness Skin: No Rash.   Wounds: N/A.    Intake/Output from previous day: No intake or output data in the 24 hours ending 02/25/15 0823   LAB RESULTS: CBC  Recent Labs Lab 02/22/15 1658  WBC 11.8*  HGB 14.1  HCT 40.5  PLT 198  MCV 95.7  MCH 33.3  MCHC 34.8  RDW 14.3  LYMPHSABS 1.4  MONOABS 1.1*  EOSABS 0.1  BASOSABS 0.0    Chemistries   Recent Labs Lab 02/22/15 1658 02/23/15 0720  NA 140  --   K 3.8  --   CL 105  --   CO2 23  --   GLUCOSE 116*  --   BUN 32*  --   CREATININE 1.14*  --   CALCIUM 10.3  --   MG  --  1.5    CBG: No results for input(s): GLUCAP in the last 168 hours.  GFR Estimated Creatinine Clearance: 27.7 mL/min (by C-G formula based on Cr of 1.14).  Coagulation profile No results for input(s): INR, PROTIME in the last 168 hours.  Cardiac Enzymes  Recent Labs Lab 02/23/15 1210 02/23/15 1948 02/24/15 0009  TROPONINI 0.05* 0.05* 0.06*    Invalid input(s): POCBNP No results for input(s): DDIMER in the last 72 hours. No results for input(s): HGBA1C in the last 72 hours. No results for input(s): CHOL, HDL, LDLCALC,  TRIG, CHOLHDL, LDLDIRECT in the last 72 hours.  Recent Labs  02/23/15 1038  TSH 0.259*    Recent Labs  02/23/15 1038  VITAMINB12 828   No results for input(s): LIPASE, AMYLASE in the last 72 hours.  Urine Studies No results for input(s): UHGB, CRYS in the last 72 hours.  Invalid input(s): UACOL, UAPR, USPG, UPH, UTP, UGL, UKET, UBIL, UNIT, UROB, ULEU, UEPI, UWBC, URBC, UBAC, CAST, UCOM, BILUA  MICROBIOLOGY: Recent Results (from the past 240 hour(s))  Virus culture     Status: None   Collection Time: 02/22/15  1:24 AM  Result Value Ref Range Status   Viral Culture Comment  Final    Comment: (NOTE) Preliminary Report: No virus isolated at 24 hours. Next report to follow after 4 days. Performed At: [facility] [place], [place] [id number] Lindon Romp MD [id number]    Source of Sample CSF  Final  Culture, blood (routine x 2)     Status: None (Preliminary result)  Collection Time: 02/22/15  4:40 PM  Result Value Ref Range Status   Specimen Description BLOOD RIGHT WRIST  Final   Special Requests BOTTLES DRAWN AEROBIC AND ANAEROBIC 5CCS  Final   Culture   Final           BLOOD CULTURE RECEIVED NO GROWTH TO DATE CULTURE WILL BE HELD FOR 5 DAYS BEFORE ISSUING A FINAL NEGATIVE REPORT Performed at [facility]    Report Status PENDING  Incomplete  Culture, blood (routine x 2)     Status: None (Preliminary result)   Collection Time: 02/22/15  4:50 PM  Result Value Ref Range Status   Specimen Description BLOOD ARM LEFT  Final   Special Requests BOTTLES DRAWN AEROBIC AND ANAEROBIC 5CCS  Final   Culture   Final           BLOOD CULTURE RECEIVED NO GROWTH TO DATE CULTURE WILL BE HELD FOR 5 DAYS BEFORE ISSUING A FINAL NEGATIVE REPORT Performed at [facility]    Report Status PENDING  Incomplete  Urine culture     Status: None   Collection Time: 02/22/15  7:10 PM  Result Value Ref Range Status   Specimen Description URINE, CLEAN  CATCH  Final   Special Requests Normal  Final   Colony Count NO GROWTH Performed at [facility]   Final   Culture NO GROWTH Performed at [facility]   Final   Report Status 02/23/2015 FINAL  Final  Gram stain     Status: None   Collection Time: 02/22/15 11:09 PM  Result Value Ref Range Status   Specimen Description CSF  Final   Special Requests TUBE 2  Final   Gram Stain   Final    CYTOSPIN SLIDE WBC PRESENT,BOTH PMN AND MONONUCLEAR NO ORGANISMS SEEN    Report Status 02/23/2015 FINAL  Final  CSF culture     Status: None (Preliminary result)   Collection Time: 02/22/15 11:10 PM  Result Value Ref Range Status   Specimen Description CSF  Final   Special Requests TUBE 2  Final   Gram Stain   Final    CYTOSPIN SLIDE WBC PRESENT,BOTH PMN AND MONONUCLEAR NO ORGANISMS SEEN Performed at [facility] Performed at [facility]    Culture   Final    NO GROWTH 1 DAY Performed at [facility]    Report Status PENDING  Incomplete  Fungus Culture with Smear     Status: None (Preliminary result)   Collection Time: 02/22/15 11:27 PM  Result Value Ref Range Status   Specimen Description CSF  Final   Special Requests NONE  Final   Fungal Smear   Final    NO YEAST OR FUNGAL ELEMENTS SEEN Performed at [facility]    Culture   Final    CULTURE IN PROGRESS FOR FOUR WEEKS Performed at [facility]    Report Status PENDING  Incomplete    RADIOLOGY STUDIES/RESULTS: Dg Chest 2 View  02/22/2015   CLINICAL DATA:  Altered mental status, recent parathyroid surgery  EXAM: CHEST  2 VIEW  COMPARISON:  01/04/2009  FINDINGS: Lungs are clear.  No pleural effusion or pneumothorax.  The heart is normal in size.  Degenerative changes of the visualized thoracolumbar spine.  IMPRESSION: No evidence of acute cardiopulmonary disease.   Electronically Signed   By: Julian Hy M.D.   On: 02/22/2015 17:59   Ct Head Wo Contrast  02/22/2015  CLINICAL DATA:  Altered mental status, recent parathyroid surgery  EXAM: CT HEAD WITHOUT CONTRAST  TECHNIQUE: Contiguous axial images were obtained from the base of the skull through the vertex without intravenous contrast.  COMPARISON:  MRI brain dated 01/23/2015  FINDINGS: No evidence of parenchymal hemorrhage or extra-axial fluid collection. No mass lesion, mass effect, or midline shift.  No CT evidence of acute infarction.  Subcortical white matter and periventricular small vessel ischemic changes.  Cerebral volume is within normal limits.  No ventriculomegaly.  The visualized paranasal sinuses are essentially clear. The mastoid air cells are unopacified.  No evidence of calvarial fracture.  IMPRESSION: No evidence of acute intracranial abnormality.  Atrophy with small vessel ischemic changes.   Electronically Signed   By: Julian Hy M.D.   On: 02/22/2015 18:21   Mr Brain Wo Contrast  01/31/2015   CLINICAL DATA:  Right-sided numbness over the last month which has increased in the last week.  EXAM: MRI HEAD WITHOUT CONTRAST  TECHNIQUE: Multiplanar, multiecho pulse sequences of the brain and surrounding structures were obtained without intravenous contrast.  COMPARISON:  MRI of the brain 01/23/2015 at [place].  FINDINGS: No acute infarct, hemorrhage, or mass lesion is present. Moderate periventricular and subcortical white matter changes bilaterally are stable. The ventricles are proportionate to the degree of atrophy. No significant extraaxial fluid collection is present.  Flow is present in the major intracranial arteries. The patient is status post bilateral lens replacements. The globes and orbits are otherwise intact. The paranasal sinuses and mastoid air cells are clear.  Skullbase is unremarkable. Midline structures are within normal limits.  IMPRESSION: Or  1. No acute intracranial abnormality or significant interval change. 2. Stable atrophy and diffuse white matter disease. This  likely reflects the sequelae of chronic microvascular ischemia.   Electronically Signed   By: San Morelle M.D.   On: 01/31/2015 14:14   Mr Jeri Cos PI Contrast  02/23/2015   CLINICAL DATA:  81 year old female presenting with declining mental status for the past week. Parathyroid surgery 1 week ago. Difficulty ambulating. Loss of appetite. History of hypertension and melanoma. Subsequent encounter.  EXAM: MRI HEAD WITHOUT AND WITH CONTRAST  TECHNIQUE: Multiplanar, multiecho pulse sequences of the brain and surrounding structures were obtained without and with intravenous contrast.  CONTRAST:  72mL MULTIHANCE GADOBENATE DIMEGLUMINE 529 MG/ML IV SOLN  COMPARISON:  02/22/2015 head CT. 01/31/2015 and 01/23/2015 brain MR.  FINDINGS: Exam is motion degraded.  No acute infarct. Areas of altered signal intensity on diffusion sequence felt to be related to artifact rather than infarct or result of posterior reversible encephalopathy syndrome.  Moderate small vessel disease type changes.  No intracranial hemorrhage.  No intracranial mass or abnormal enhancement.  Global atrophy without hydrocephalus.  Minimal paranasal sinus mucosal thickening.  Orbital structures grossly within normal limits.  IMPRESSION: No acute intracranial abnormality noted on this motion degraded exam.  Moderate small vessel disease type changes.   Electronically Signed   By: Genia Del M.D.   On: 02/23/2015 09:25   Mr Cervical Spine Wo Contrast  02/17/2015   CLINICAL DATA:  Right arm weakness following recent parathyroidectomy.  EXAM: MRI CERVICAL SPINE WITHOUT CONTRAST  TECHNIQUE: Multiplanar, multisequence MR imaging of the cervical spine was performed. No intravenous contrast was administered.  COMPARISON:  None.  FINDINGS: Images are mildly to moderately degraded by motion artifact, particularly axial images. This limits detailed evaluation of degenerative changes as well as of the soft tissue changes in the  lower right neck.  There  is slight reversal of the normal cervical lordosis. There is no listhesis. Vertebral body heights are preserved. Vertebral bone marrow signal is mildly to moderately heterogeneous diffusely. No vertebral marrow edema is identified. A 1 cm hemangioma is noted in the C7 vertebral body. 8 mm lesion partially visualized in the T3 vertebral body also likely represents a hemangioma.  There is mild-to-moderate disc space narrowing at C4-5 and C5-6. Craniocervical junction is unremarkable. Cervical spinal cord is normal in caliber without definite signal abnormality identified. Anterior neck soft tissue edema and more focal signal changes about/posterior to the right thyroid lobe likely reflect postoperative changes from recent parathyroidectomy, possibly with a small underlying hematoma.  C2-3: Mild bilateral facet arthrosis without disc herniation or stenosis.  C3-4: Shallow disc bulging and mild uncovertebral spurring may result in minimal left neural foraminal narrowing without spinal stenosis.  C4-5: Broad-based posterior disc osteophyte complex results in mild-to-moderate bilateral neural foraminal stenosis without spinal stenosis.  C5-6: Broad-based posterior disc osteophyte complex results in likely mild right and moderate left neural foraminal stenosis without spinal stenosis.  C6-7:  No disc herniation or significant stenosis identified.  C7-T1:  Negative.  IMPRESSION: 1. Motion degraded examination delete that. Multilevel cervical disc degeneration with mild-to-moderate neural foraminal stenosis as above. No spinal stenosis. 2. Soft tissue changes in the lower neck consistent with recent parathyroidectomy.   Electronically Signed   By: Logan Bores   On: 02/17/2015 15:19   Nm Parathyroid W/spect  01/26/2015   CLINICAL DATA:  Hyperparathyroidism. Primary hyperparathyroidism. PTH equal 71  EXAM: NM PARATHYROID SCINTIGRAPHY AND SPECT IMAGING  TECHNIQUE: Following intravenous administration of radiopharmaceutical,  early and 2-hour delayed planar images were obtained in the anterior projection. Delayed triplanar SPECT images were also obtained at 2 hours.  RADIOPHARMACEUTICALS:  26.4 mCiTc-38m Sestamibi IV  COMPARISON:  None.  FINDINGS: There is a focus of residual activity in the right aspect of the thyroid bed on the planar imaging. SPECT imaging demonstrates a distinct focus of radiotracer accumulation localizing to the superior aspect of the right lobe of the thyroid gland.  IMPRESSION: Focal activity localizing to the superior aspect of the RIGHT lobe of thyroid gland is most consists with parathyroid adenoma.   Electronically Signed   By: Suzy Bouchard M.D.   On: 01/26/2015 14:19   Dg Lumbar Puncture Fluoro Guide  02/22/2015   CLINICAL DATA:  Acute onset altered mental status for 1 week. Initial encounter.  EXAM: DIAGNOSTIC LUMBAR PUNCTURE UNDER FLUOROSCOPIC GUIDANCE  FLUOROSCOPY TIME:  Fluoroscopy Time (in minutes and seconds): 18 seconds  Number of Acquired Images:  One saved image  PROCEDURE: Informed consent was obtained from the patient's daughter prior to the procedure, including potential complications of headache, allergy, and pain. With the patient prone, the lower back was prepped with Betadine. 1% Lidocaine was used for local anesthesia. Lumbar puncture was performed at the L4-L5 level using a 20 gauge needle with return of mildly bloody and slightly hazy CSF with a very low non-measurable opening pressure. 8 mL of CSF were obtained for laboratory studies. The patient tolerated the procedure well and there were no apparent complications.  IMPRESSION: Successful lumbar puncture. Collected CSF slightly hazy and mildly serosanguineous in nature.   Electronically Signed   By: Garald Balding M.D.   On: 02/22/2015 23:14    Oren Binet, MD  [facility] Pager:[phone]  If 7PM-7AM, please contact night-coverage [email or web address] Password [facility] 02/25/2015, 8:23 AM   LOS: 3  days

## 2015-02-25 NOTE — Clinical Social Work Psychosocial (Signed)
Clinical Social Work Department BRIEF PSYCHOSOCIAL ASSESSMENT 02/25/2015  Patient:  [name],[name]     Account Number:  [id number]     Admit date:  02/22/2015  Clinical Social Worker:  Glendon Axe, CLINICAL SOCIAL WORKER  Date/Time:  02/25/2015 09:28 PM  Referred by:  Physician  Date Referred:  02/25/2015 Referred for  SNF Placement  Residential hospice placement   Other Referral:   Interview type:  Other - See comment Other interview type:   CSW met with pt's husband Arnell Sieving at bedside.    PSYCHOSOCIAL DATA Living Status:  FACILITY Admitted from facility:  [facility] Level of care:  [place] Primary support name:  Fredrica Capano Primary support relationship to patient:  SPOUSE Degree of support available:   Strong    CURRENT CONCERNS Current Concerns  Post-Acute Placement   Other Concerns:    SOCIAL WORK ASSESSMENT / PLAN Clinical Social Worker met with pt's husband at bedside in reference to post-acute placement for residential hospice placement. CSW introduced CSW role and discharge process. Pt's husband reported pt is a resident at [facility] and [facility] and has physically declined. Pt has nursing care from [facility] at bedside as well. Pt has declined since admission and will now require residential hospice placement. Pt and pt's family have been involved with palliative care service for goals of care. Pt's husband prefers placement at [facility] and [place]. CSW placed referral with [facility] who has assessed and accepted pt for tomorrow morning. Pt's husband stated he would like pt's current nurse to also assist pt at [facility]. Pt's husband further stated he doesn't understand what happened in regards to pt declining so quickly. CSW will continue to follow pt and pt's family for continued support and to facilitate pt's discharge needs.   Assessment/plan status:  Psychosocial  Support/Ongoing Assessment of Needs Other assessment/ plan:   Referral to [facility].   Information/referral to community resources:   Residential Hospice information and list provided.    PATIENT'S/FAMILY'S RESPONSE TO PLAN OF CARE: Pt disoriented and resting comfortably in bed. Pt's husband sitting at bedside holding pt's hand. Pt and pt's husband surrounded by supportive family and friends. Pt's husband noticeably sadden by pt's condition however reported he wants her to be comfortable. Pt's family agreeable to placement at [facility]. Pt's husband pleasant and appreciated social work intervention.       Glendon Axe, MSW, LCSWA [phone] 02/25/2015 9:43 PM

## 2015-02-25 NOTE — Progress Notes (Signed)
Progress Note from the Palliative Medicine Team at [place]:  -discussed current medical situation and limited prognosis  Patient is less responsive today with only sips/chips for oral intact.  -continued conversation with husband and daughters regarding [place] and EOL wishes, and natural trajectory and expectations at EOL   Objective: Allergies  Allergen Reactions  . Codeine Other (See Comments)    Unk   Scheduled Meds: . dorzolamide-timolol  1 drop Both Eyes BID  . feeding supplement (ENSURE)  1 Container Oral BID PC  . lactose free nutrition  237 mL Oral Q24H  . morphine CONCENTRATE  10 mg Oral 3 times per day  . Travoprost (BAK Free)  1 drop Both Eyes QHS   Continuous Infusions:  PRN Meds:.acetaminophen, LORazepam, morphine injection, morphine CONCENTRATE, [DISCONTINUED] ondansetron **OR** ondansetron (ZOFRAN) IV  BP 141/66 mmHg  Pulse 114  Temp(Src) 97.8 F (36.6 C) (Core (Comment))  Resp 22  Ht 5\' 3"  (1.6 m)  Wt 59.421 kg (131 lb)  BMI 23.21 kg/m2  SpO2 95%   PPS: 20 %   No intake or output data in the 24 hours ending 02/25/15 1157      Physical Exam:  General: lethargic, unable to follow commands HEENT: moist buccal membranes Chest:   Decreased in bases, scattered course BS CVS: tachycardic Abdomen: soft NT  Labs: CBC    Component Value Date/Time   WBC 11.8* 02/22/2015 1658   WBC 7.5 06/03/2014 1334   RBC 4.23 02/22/2015 1658   RBC 4.53 02/18/2015 0618   RBC 4.31 06/03/2014 1334   HGB 14.1 02/22/2015 1658   HGB 14.0 06/03/2014 1334   HCT 40.5 02/22/2015 1658   HCT 41.1 06/03/2014 1334   PLT 198 02/22/2015 1658   PLT 190 06/03/2014 1334   MCV 95.7 02/22/2015 1658   MCV 95 06/03/2014 1334   MCH 33.3 02/22/2015 1658   MCH 32.5 06/03/2014 1334   MCHC 34.8 02/22/2015 1658   MCHC 34.1 06/03/2014 1334   RDW 14.3 02/22/2015 1658   RDW 14.1 06/03/2014 1334   LYMPHSABS 1.4 02/22/2015 1658   LYMPHSABS 1.3 06/03/2014 1334   MONOABS 1.1*  02/22/2015 1658   EOSABS 0.1 02/22/2015 1658   EOSABS 0.2 06/03/2014 1334   BASOSABS 0.0 02/22/2015 1658   BASOSABS 0.0 06/03/2014 1334    BMET    Component Value Date/Time   NA 140 02/22/2015 1658   NA 139 06/03/2014 1336   K 3.8 02/22/2015 1658   K 3.9 06/03/2014 1336   CL 105 02/22/2015 1658   CL 96* 06/03/2014 1336   CO2 23 02/22/2015 1658   CO2 28 06/03/2014 1336   GLUCOSE 116* 02/22/2015 1658   GLUCOSE 99 06/03/2014 1336   BUN 32* 02/22/2015 1658   BUN 14 06/03/2014 1336   CREATININE 1.14* 02/22/2015 1658   CREATININE 1.0 06/03/2014 1336   CALCIUM 10.3 02/22/2015 1658   CALCIUM 9.8 06/03/2014 1336   CALCIUM TEST NOT PERFORMED 06/05/2012 0808   GFRNONAA 41* 02/22/2015 1658   GFRAA 48* 02/22/2015 1658    CMP     Component Value Date/Time   NA 140 02/22/2015 1658   NA 139 06/03/2014 1336   K 3.8 02/22/2015 1658   K 3.9 06/03/2014 1336   CL 105 02/22/2015 1658   CL 96* 06/03/2014 1336   CO2 23 02/22/2015 1658   CO2 28 06/03/2014 1336   GLUCOSE 116* 02/22/2015 1658   GLUCOSE 99 06/03/2014 1336   BUN 32* 02/22/2015 1658  BUN 14 06/03/2014 1336   CREATININE 1.14* 02/22/2015 1658   CREATININE 1.0 06/03/2014 1336   CALCIUM 10.3 02/22/2015 1658   CALCIUM 9.8 06/03/2014 1336   CALCIUM TEST NOT PERFORMED 06/05/2012 0808   PROT 7.3 02/22/2015 1658   PROT 8.0 06/03/2014 1336   ALBUMIN 3.6 02/22/2015 1658   AST 27 02/22/2015 1658   AST 21 06/03/2014 1336   ALT 20 02/22/2015 1658   ALT 18 06/03/2014 1336   ALKPHOS 57 02/22/2015 1658   ALKPHOS 57 06/03/2014 1336   BILITOT 1.5* 02/22/2015 1658   BILITOT 0.70 06/03/2014 1336   GFRNONAA 41* 02/22/2015 1658   GFRAA 48* 02/22/2015 1658     Assessment and Plan: 1. Code Status: DNR/DNI-comfort is main focus of care 2. Symptom Control:      Pain/Dyspnea: Morphine 1 mg IV every 4 hrs prn                                 Morphine soln 10 mg po/sl tid       Agitation: Ativan 1 mg IVevery 4 hrs prn 3. Psycho/Social:  Emotional support to family, all tearful when discussing understanding her limited prognosis.  Family also share many happy memories and love for Ms Helmuth 4. [employer] support 5. Disposition: Hopeful for hospice facility will write for choice.  Family understand level of care once discharge (no further labs, diagnostics, re hospitalizations, artifical hydration)  Patient Documents Completed or Given: Document Given Completed  Advanced Directives Pkt    MOST X   DNR  X  Gone from My Sight    Hard Choices X     Time In Time Out Total Time Spent with Patient Total Overall Time  1200 1235 35 min 35 min    Greater than 50%  of this time was spent counseling and coordinating care related to the above assessment and plan.  Wadie Lessen NP  Palliative Medicine Team Team Phone # [phone] Pager [phone]  Discussed with Dr Sloan Leiter 1

## 2015-02-25 NOTE — Progress Notes (Signed)
Physical Therapy Discharge Patient Details Name: [name] MRN: [id number] DOB: [birth year] Today's Date: 02/25/2015 Time:  -     Patient discharged from PT services secondary to medical decline - will need to re-order PT to resume therapy services. Pt's status continues to decline and family has now agreed to only comfort care measures.  Please see latest therapy progress note for current level of functioning and progress toward goals.    Progress and discharge plan discussed with patient and/or caregiver: Patient/Caregiver agrees with plan  GP     Locke Barrell  Pager [phone]  02/25/2015, 1:27 PM

## 2015-02-25 NOTE — Consult Note (Signed)
[facility] Liaison: [employer] available for [name]. Met with spouse to complete registration paper work. Dr. Shawna Orleans to assume care at [facility] with symptom management from Dr. Orpah Melter. Please fax discharge summary to [phone]. RN please call report to [phone]. Please arrange transport for as early in the day as possible. Thank you. Erling Conte LCSW [phone]

## 2015-02-25 NOTE — Clinical Social Work Note (Signed)
Clinical Social Worker has assessed patient and pt's family present at bedside. Full psychosocial assessment to follow.   Residential hospice referral was made this afternoon to [facility]. Hospice Liaison, Harmon Pier working with family and confirming insurance coverage.   Glendon Axe, MSW, LCSWA [phone] 02/25/2015 3:51 PM

## 2015-02-25 NOTE — Progress Notes (Signed)
SLP Cancellation Note  Patient Details Name: [name] MRN: [id number] DOB: 1923-08-05   Cancelled treatment:       Reason Eval/Treat Not Completed: Other (comment) Chart review indicates further medical decline, family now wishes for full comfort care and pt has little to no PO intake. SLP to sign off at this time - please re-order if we can be of assistance.   Germain Osgood, M.A. CCC-SLP [phone]  Germain Osgood 02/25/2015, 2:56 PM

## 2015-02-25 NOTE — Progress Notes (Signed)
Pt resting comfortably with no signs of discomfort noted.  Pt turned Q2 hours.  Family pleased that twitching/restlessness has stopped.  Pt with no intake for meals.  Will continue to monitor.   Cori Razor, RN

## 2015-02-26 DIAGNOSIS — R52 Pain, unspecified: Secondary | ICD-10-CM

## 2015-02-26 LAB — CSF CULTURE: CULTURE: NO GROWTH

## 2015-02-26 MED ORDER — ACETAMINOPHEN 325 MG PO TABS: 650.0000 mg | ORAL_TABLET | Freq: Four times a day (QID) | ORAL | Status: AC | PRN

## 2015-02-26 MED ORDER — SCOPOLAMINE 1 MG/3DAYS TD PT72: 1.0000 | MEDICATED_PATCH | TRANSDERMAL | Status: AC

## 2015-02-26 MED ORDER — MORPHINE SULFATE (CONCENTRATE) 10 MG/0.5ML PO SOLN: 10.0000 mg | ORAL | Status: AC | PRN

## 2015-02-26 MED ORDER — LORAZEPAM 2 MG/ML IJ SOLN: 1.0000 mg | INTRAMUSCULAR | Status: AC | PRN

## 2015-02-26 MED ORDER — MORPHINE SULFATE 2 MG/ML IJ SOLN: 1.0000 mg | INTRAMUSCULAR | Status: AC | PRN

## 2015-02-26 MED ORDER — MORPHINE SULFATE 2 MG/ML IJ SOLN
2.0000 mg | Freq: Four times a day (QID) | INTRAMUSCULAR | Status: DC
  Administered 2015-02-26: 2 mg via INTRAVENOUS
  Filled 2015-02-26: qty 1

## 2015-02-26 MED ORDER — MORPHINE SULFATE 2 MG/ML IJ SOLN: 1.0000 mg | INTRAMUSCULAR | Status: DC | PRN

## 2015-02-26 MED ORDER — MORPHINE SULFATE (CONCENTRATE) 10 MG/0.5ML PO SOLN: 10.0000 mg | Freq: Three times a day (TID) | ORAL | Status: AC

## 2015-02-26 NOTE — Progress Notes (Signed)
Progress Note from the Palliative Medicine Team at [place]:  -  Patient is minimally  responsive today, daughter Haynes Dage at bedside  -continued conversation with daughter regarding  natural trajectory and expectations at EOL  -discussed medications for comfort and detailed schedule with daughter   Objective: Allergies  Allergen Reactions  . Codeine Other (See Comments)    Unk   Scheduled Meds: . dorzolamide-timolol  1 drop Both Eyes BID  . feeding supplement (ENSURE)  1 Container Oral BID PC  . lactose free nutrition  237 mL Oral Q24H  .  morphine injection  2 mg Intravenous Q6H  . scopolamine  1 patch Transdermal Q72H  . Travoprost (BAK Free)  1 drop Both Eyes QHS   Continuous Infusions:  PRN Meds:.LORazepam, morphine injection, morphine CONCENTRATE, [DISCONTINUED] ondansetron **OR** ondansetron (ZOFRAN) IV  BP 122/54 mmHg  Pulse 99  Temp(Src) 97.8 F (36.6 C) (Axillary)  Resp 20  Ht 5\' 3"  (1.6 m)  Wt 59.421 kg (131 lb)  BMI 23.21 kg/m2  SpO2 99%   PPS: 20 %    Intake/Output Summary (Last 24 hours) at 02/26/15 0900 Last data filed at 02/25/15 1211  Gross per 24 hour  Intake      0 ml  Output      0 ml  Net      0 ml        Physical Exam:  General: lethargic, unable to follow commands HEENT: moist buccal membranes, audible throat secretions Chest:   Decreased in bases, scattered course BS CVS: tachycardic, rate 110 Abdomen: soft NT  Labs: CBC    Component Value Date/Time   WBC 11.8* 02/22/2015 1658   WBC 7.5 06/03/2014 1334   RBC 4.23 02/22/2015 1658   RBC 4.53 02/18/2015 0618   RBC 4.31 06/03/2014 1334   HGB 14.1 02/22/2015 1658   HGB 14.0 06/03/2014 1334   HCT 40.5 02/22/2015 1658   HCT 41.1 06/03/2014 1334   PLT 198 02/22/2015 1658   PLT 190 06/03/2014 1334   MCV 95.7 02/22/2015 1658   MCV 95 06/03/2014 1334   MCH 33.3 02/22/2015 1658   MCH 32.5 06/03/2014 1334   MCHC 34.8 02/22/2015 1658   MCHC 34.1 06/03/2014 1334   RDW 14.3 02/22/2015 1658   RDW 14.1 06/03/2014 1334   LYMPHSABS 1.4 02/22/2015 1658   LYMPHSABS 1.3 06/03/2014 1334   MONOABS 1.1* 02/22/2015 1658   EOSABS 0.1 02/22/2015 1658   EOSABS 0.2 06/03/2014 1334   BASOSABS 0.0 02/22/2015 1658   BASOSABS 0.0 06/03/2014 1334    BMET    Component Value Date/Time   NA 140 02/22/2015 1658   NA 139 06/03/2014 1336   K 3.8 02/22/2015 1658   K 3.9 06/03/2014 1336   CL 105 02/22/2015 1658   CL 96* 06/03/2014 1336   CO2 23 02/22/2015 1658   CO2 28 06/03/2014 1336   GLUCOSE 116* 02/22/2015 1658   GLUCOSE 99 06/03/2014 1336   BUN 32* 02/22/2015 1658   BUN 14 06/03/2014 1336   CREATININE 1.14* 02/22/2015 1658   CREATININE 1.0 06/03/2014 1336   CALCIUM 10.3 02/22/2015 1658   CALCIUM 9.8 06/03/2014 1336   CALCIUM TEST NOT PERFORMED 06/05/2012 0808   GFRNONAA 41* 02/22/2015 1658   GFRAA 48* 02/22/2015 1658    CMP     Component Value Date/Time   NA 140 02/22/2015 1658   NA 139 06/03/2014 1336   K 3.8 02/22/2015 1658   K 3.9 06/03/2014 1336  CL 105 02/22/2015 1658   CL 96* 06/03/2014 1336   CO2 23 02/22/2015 1658   CO2 28 06/03/2014 1336   GLUCOSE 116* 02/22/2015 1658   GLUCOSE 99 06/03/2014 1336   BUN 32* 02/22/2015 1658   BUN 14 06/03/2014 1336   CREATININE 1.14* 02/22/2015 1658   CREATININE 1.0 06/03/2014 1336   CALCIUM 10.3 02/22/2015 1658   CALCIUM 9.8 06/03/2014 1336   CALCIUM TEST NOT PERFORMED 06/05/2012 0808   PROT 7.3 02/22/2015 1658   PROT 8.0 06/03/2014 1336   ALBUMIN 3.6 02/22/2015 1658   AST 27 02/22/2015 1658   AST 21 06/03/2014 1336   ALT 20 02/22/2015 1658   ALT 18 06/03/2014 1336   ALKPHOS 57 02/22/2015 1658   ALKPHOS 57 06/03/2014 1336   BILITOT 1.5* 02/22/2015 1658   BILITOT 0.70 06/03/2014 1336   GFRNONAA 41* 02/22/2015 1658   GFRAA 48* 02/22/2015 1658     Assessment and Plan: 1. Code Status: DNR/DNI-comfort is main focus of care 2. Symptom Control:      Pain/Dyspnea: Morphine 1 mg IV every 1 hrs  prn                                 Morphine 2 mg IV every 6 hrs scheduled       Agitation: Ativan 1 mg IVevery 4 hrs prn 3. Psycho/Social: Emotional support offerd to daughter at bedside 4. [employer] support Disposition:  To hospice facility today  Patient Documents Completed or Given: Document Given Completed  Advanced Directives Pkt    MOST X   DNR  X  Gone from My Sight    Hard Choices X     Time In Time Out Total Time Spent with Patient Total Overall Time  0815 0900  45 min  45 min    Greater than 50%  of this time was spent counseling and coordinating care related to the above assessment and plan.  Wadie Lessen NP  Palliative Medicine Team Team Phone # [phone] Pager [phone]  Discussed with nursing staff 1

## 2015-02-26 NOTE — Discharge Summary (Addendum)
PATIENT DETAILS Name: [name] Age: 81 y.o. Sex: female Date of Birth: [birth year] MRN: [id number]. Admitting Physician: Merton Border, MD [id number] Shawna Orleans, DO  Admit Date: 02/22/2015 Discharge date: 02/26/2015  Recommendations for Outpatient Follow-up:  1. Optimize comfort care 2.  Workup for Creuzfeldt-Jakob disease from CSF pending-please follow  PRIMARY DISCHARGE DIAGNOSIS:  Active Problems:   Altered mental status   CVA (cerebral infarction)   Paroxysmal atrial fibrillation   Bradycardia   Palliative care encounter   DNR (do not resuscitate)   Weakness generalized   Dyspnea   Agitation      PAST MEDICAL HISTORY: Past Medical History  Diagnosis Date  . Anxiety   . Hypertension   . Glaucoma 12/31/2009  . ANXIETY 12/31/2009  . CHEST PAIN 12/08/2004  . FATIGUE 01/09/2011  . HYPERTENSION 12/31/2009  . LEFT BUNDLE BRANCH BLOCK 01/03/2010  . MALIGNANT MELANOMA, HX OF 01/03/2010  . MITRAL VALVE PROLAPSE 12/31/2009  . Raynaud's phenomenon   . Anemia     Macrocystosis  . Depression   . Hyperparathyroidism, primary   . Melanoma     "back"  . Basal cell carcinoma     "nose"  . Squamous carcinoma     RLE; chest"  . CAD (coronary artery disease)     cath 12/2004 Dr. Rex Kras, 50% mid LAD, no other significant coronary dx, EF normal    DISCHARGE MEDICATIONS: Current Discharge Medication List    START taking these medications   Details  acetaminophen (TYLENOL) 325 MG tablet Take 2 tablets (650 mg total) by mouth every 6 (six) hours as needed for mild pain, moderate pain, fever or headache.    LORazepam (ATIVAN) 2 MG/ML injection Inject 0.5 mLs (1 mg total) into the vein every 4 (four) hours as needed for anxiety or sedation. Qty: 5 mL, Refills: 0    morphine 2 MG/ML injection Inject 0.5 mLs (1 mg total) into the vein every 4 (four) hours as needed (sedation). Qty: 5 mL, Refills: 0    !! Morphine Sulfate (MORPHINE CONCENTRATE) 10 MG/0.5ML SOLN concentrated solution Take  0.5 mLs (10 mg total) by mouth every 8 (eight) hours. Qty: 30 mL, Refills: 0    !! Morphine Sulfate (MORPHINE CONCENTRATE) 10 MG/0.5ML SOLN concentrated solution Take 0.5 mLs (10 mg total) by mouth every 4 (four) hours as needed for severe pain or shortness of breath (sedation). Qty: 30 mL, Refills: 0    scopolamine (TRANSDERM-SCOP) 1 MG/3DAYS Place 1 patch (1.5 mg total) onto the skin every 3 (three) days. Qty: 10 patch, Refills: 12     !! - Potential duplicate medications found. Please discuss with provider.    CONTINUE these medications which have NOT CHANGED   Details  dorzolamide-timolol (COSOPT) 22.3-6.8 MG/ML ophthalmic solution Place 1 drop into both eyes every 12 (twelve) hours.      travoprost, benzalkonium, (TRAVATAN) 0.004 % ophthalmic solution Place 1 drop into both eyes at bedtime.        STOP taking these medications     cholecalciferol (VITAMIN D) 1000 UNITS tablet      ALPRAZolam (XANAX) 0.25 MG tablet      amLODipine (NORVASC) 5 MG tablet      ipratropium (ATROVENT) 0.06 % nasal spray      megestrol (MEGACE) 40 MG tablet      polyethylene glycol (MIRALAX / GLYCOLAX) packet      potassium chloride (K-DUR) 10 MEQ tablet      Probiotic Product (ALIGN) 4 MG CAPS  senna-docusate (SENOKOT-S) 8.6-50 MG per tablet      valsartan (DIOVAN) 320 MG tablet      verapamil (CALAN-SR) 120 MG CR tablet      vitamin B-12 (CYANOCOBALAMIN) 250 MCG tablet         ALLERGIES:   Allergies  Allergen Reactions  . Codeine Other (See Comments)    Unk    BRIEF HPI:  See H&P, Labs, Consult and Test reports for all details in brief, patient 82 year old female patient with history of HTN, LBBB, MVP, anxiety & depression, primary hyperparathyroidism s/p parathyroidectomy 02/16/15, discharged from rehabilitation facility on 02/18/15, seen by OP neurology on 02/04/15 for sensory ataxia and inability to stand independently, returned to her neurologist for evaluation on 3/21 with  history of progressive neurological and functional decline over the last 3 weeks (more so in the last week), inability to swallow solids or liquids by mouth, inability to ambulate even with a walker.Neurologist noticed right sided weakness, right visual field neglect, right sided nystagmus, neck rigidity, rigidity of right extremities. Due to her dramatic deterioration, she was sent to the ED for further evaluation   CONSULTATIONS:   cardiology, neurology and Palliative Care  PERTINENT RADIOLOGIC STUDIES: Dg Chest 2 View  02/22/2015   CLINICAL DATA:  Altered mental status, recent parathyroid surgery  EXAM: CHEST  2 VIEW  COMPARISON:  01/04/2009  FINDINGS: Lungs are clear.  No pleural effusion or pneumothorax.  The heart is normal in size.  Degenerative changes of the visualized thoracolumbar spine.  IMPRESSION: No evidence of acute cardiopulmonary disease.   Electronically Signed   By: Julian Hy M.D.   On: 02/22/2015 17:59   Ct Head Wo Contrast  02/22/2015   CLINICAL DATA:  Altered mental status, recent parathyroid surgery  EXAM: CT HEAD WITHOUT CONTRAST  TECHNIQUE: Contiguous axial images were obtained from the base of the skull through the vertex without intravenous contrast.  COMPARISON:  MRI brain dated 01/23/2015  FINDINGS: No evidence of parenchymal hemorrhage or extra-axial fluid collection. No mass lesion, mass effect, or midline shift.  No CT evidence of acute infarction.  Subcortical white matter and periventricular small vessel ischemic changes.  Cerebral volume is within normal limits.  No ventriculomegaly.  The visualized paranasal sinuses are essentially clear. The mastoid air cells are unopacified.  No evidence of calvarial fracture.  IMPRESSION: No evidence of acute intracranial abnormality.  Atrophy with small vessel ischemic changes.   Electronically Signed   By: Julian Hy M.D.   On: 02/22/2015 18:21   Mr Brain Wo Contrast  01/31/2015   CLINICAL DATA:  Right-sided  numbness over the last month which has increased in the last week.  EXAM: MRI HEAD WITHOUT CONTRAST  TECHNIQUE: Multiplanar, multiecho pulse sequences of the brain and surrounding structures were obtained without intravenous contrast.  COMPARISON:  MRI of the brain 01/23/2015 at [facility].  FINDINGS: No acute infarct, hemorrhage, or mass lesion is present. Moderate periventricular and subcortical white matter changes bilaterally are stable. The ventricles are proportionate to the degree of atrophy. No significant extraaxial fluid collection is present.  Flow is present in the major intracranial arteries. The patient is status post bilateral lens replacements. The globes and orbits are otherwise intact. The paranasal sinuses and mastoid air cells are clear.  Skullbase is unremarkable. Midline structures are within normal limits.  IMPRESSION: Or  1. No acute intracranial abnormality or significant interval change. 2. Stable atrophy and diffuse white matter disease. This likely reflects the sequelae of  chronic microvascular ischemia.   Electronically Signed   By: San Morelle M.D.   On: 01/31/2015 14:14   Mr Jeri Cos YQ Contrast  02/23/2015   CLINICAL DATA:  81 year old female presenting with declining mental status for the past week. Parathyroid surgery 1 week ago. Difficulty ambulating. Loss of appetite. History of hypertension and melanoma. Subsequent encounter.  EXAM: MRI HEAD WITHOUT AND WITH CONTRAST  TECHNIQUE: Multiplanar, multiecho pulse sequences of the brain and surrounding structures were obtained without and with intravenous contrast.  CONTRAST:  25mL MULTIHANCE GADOBENATE DIMEGLUMINE 529 MG/ML IV SOLN  COMPARISON:  02/22/2015 head CT. 01/31/2015 and 01/23/2015 brain MR.  FINDINGS: Exam is motion degraded.  No acute infarct. Areas of altered signal intensity on diffusion sequence felt to be related to artifact rather than infarct or result of posterior reversible encephalopathy syndrome.   Moderate small vessel disease type changes.  No intracranial hemorrhage.  No intracranial mass or abnormal enhancement.  Global atrophy without hydrocephalus.  Minimal paranasal sinus mucosal thickening.  Orbital structures grossly within normal limits.  IMPRESSION: No acute intracranial abnormality noted on this motion degraded exam.  Moderate small vessel disease type changes.   Electronically Signed   By: Genia Del M.D.   On: 02/23/2015 09:25   Mr Cervical Spine Wo Contrast  02/17/2015   CLINICAL DATA:  Right arm weakness following recent parathyroidectomy.  EXAM: MRI CERVICAL SPINE WITHOUT CONTRAST  TECHNIQUE: Multiplanar, multisequence MR imaging of the cervical spine was performed. No intravenous contrast was administered.  COMPARISON:  None.  FINDINGS: Images are mildly to moderately degraded by motion artifact, particularly axial images. This limits detailed evaluation of degenerative changes as well as of the soft tissue changes in the lower right neck.  There is slight reversal of the normal cervical lordosis. There is no listhesis. Vertebral body heights are preserved. Vertebral bone marrow signal is mildly to moderately heterogeneous diffusely. No vertebral marrow edema is identified. A 1 cm hemangioma is noted in the C7 vertebral body. 8 mm lesion partially visualized in the T3 vertebral body also likely represents a hemangioma.  There is mild-to-moderate disc space narrowing at C4-5 and C5-6. Craniocervical junction is unremarkable. Cervical spinal cord is normal in caliber without definite signal abnormality identified. Anterior neck soft tissue edema and more focal signal changes about/posterior to the right thyroid lobe likely reflect postoperative changes from recent parathyroidectomy, possibly with a small underlying hematoma.  C2-3: Mild bilateral facet arthrosis without disc herniation or stenosis.  C3-4: Shallow disc bulging and mild uncovertebral spurring may result in minimal left  neural foraminal narrowing without spinal stenosis.  C4-5: Broad-based posterior disc osteophyte complex results in mild-to-moderate bilateral neural foraminal stenosis without spinal stenosis.  C5-6: Broad-based posterior disc osteophyte complex results in likely mild right and moderate left neural foraminal stenosis without spinal stenosis.  C6-7:  No disc herniation or significant stenosis identified.  C7-T1:  Negative.  IMPRESSION: 1. Motion degraded examination delete that. Multilevel cervical disc degeneration with mild-to-moderate neural foraminal stenosis as above. No spinal stenosis. 2. Soft tissue changes in the lower neck consistent with recent parathyroidectomy.   Electronically Signed   By: Logan Bores   On: 02/17/2015 15:19   Dg Lumbar Puncture Fluoro Guide  02/22/2015   CLINICAL DATA:  Acute onset altered mental status for 1 week. Initial encounter.  EXAM: DIAGNOSTIC LUMBAR PUNCTURE UNDER FLUOROSCOPIC GUIDANCE  FLUOROSCOPY TIME:  Fluoroscopy Time (in minutes and seconds): 18 seconds  Number of Acquired Images:  One saved  image  PROCEDURE: Informed consent was obtained from the patient's daughter prior to the procedure, including potential complications of headache, allergy, and pain. With the patient prone, the lower back was prepped with Betadine. 1% Lidocaine was used for local anesthesia. Lumbar puncture was performed at the L4-L5 level using a 20 gauge needle with return of mildly bloody and slightly hazy CSF with a very low non-measurable opening pressure. 8 mL of CSF were obtained for laboratory studies. The patient tolerated the procedure well and there were no apparent complications.  IMPRESSION: Successful lumbar puncture. Collected CSF slightly hazy and mildly serosanguineous in nature.   Electronically Signed   By: Garald Balding M.D.   On: 02/22/2015 23:14     PERTINENT LAB RESULTS: CBC: No results for input(s): WBC, HGB, HCT, PLT in the last 72 hours. CMET CMP     Component  Value Date/Time   NA 140 02/22/2015 1658   NA 139 06/03/2014 1336   K 3.8 02/22/2015 1658   K 3.9 06/03/2014 1336   CL 105 02/22/2015 1658   CL 96* 06/03/2014 1336   CO2 23 02/22/2015 1658   CO2 28 06/03/2014 1336   GLUCOSE 116* 02/22/2015 1658   GLUCOSE 99 06/03/2014 1336   BUN 32* 02/22/2015 1658   BUN 14 06/03/2014 1336   CREATININE 1.14* 02/22/2015 1658   CREATININE 1.0 06/03/2014 1336   CALCIUM 10.3 02/22/2015 1658   CALCIUM 9.8 06/03/2014 1336   CALCIUM TEST NOT PERFORMED 06/05/2012 0808   PROT 7.3 02/22/2015 1658   PROT 8.0 06/03/2014 1336   ALBUMIN 3.6 02/22/2015 1658   AST 27 02/22/2015 1658   AST 21 06/03/2014 1336   ALT 20 02/22/2015 1658   ALT 18 06/03/2014 1336   ALKPHOS 57 02/22/2015 1658   ALKPHOS 57 06/03/2014 1336   BILITOT 1.5* 02/22/2015 1658   BILITOT 0.70 06/03/2014 1336   GFRNONAA 41* 02/22/2015 1658   GFRAA 48* 02/22/2015 1658    GFR Estimated Creatinine Clearance: 27.7 mL/min (by C-G formula based on Cr of 1.14). No results for input(s): LIPASE, AMYLASE in the last 72 hours.  Recent Labs  02/23/15 1210 02/23/15 1948 02/24/15 0009  TROPONINI 0.05* 0.05* 0.06*   Invalid input(s): POCBNP No results for input(s): DDIMER in the last 72 hours. No results for input(s): HGBA1C in the last 72 hours. No results for input(s): CHOL, HDL, LDLCALC, TRIG, CHOLHDL, LDLDIRECT in the last 72 hours.  Recent Labs  02/23/15 1038  TSH 0.259*    Recent Labs  02/23/15 1038  VITAMINB12 828   Coags: No results for input(s): INR in the last 72 hours.  Invalid input(s): PT Microbiology: Recent Results (from the past 240 hour(s))  Virus culture     Status: None   Collection Time: 02/22/15  1:24 AM  Result Value Ref Range Status   Viral Culture Comment  Final    Comment: (NOTE) Preliminary Report: No virus isolated at 24 hours. Next report to follow after 4 days. Performed At: [facility] [place], [place] [id number] Lindon Romp MD [id number]    Source of Sample CSF  Final  Culture, blood (routine x 2)     Status: None (Preliminary result)   Collection Time: 02/22/15  4:40 PM  Result Value Ref Range Status   Specimen Description BLOOD RIGHT WRIST  Final   Special Requests BOTTLES DRAWN AEROBIC AND ANAEROBIC 5CCS  Final   Culture   Final  BLOOD CULTURE RECEIVED NO GROWTH TO DATE CULTURE WILL BE HELD FOR 5 DAYS BEFORE ISSUING A FINAL NEGATIVE REPORT Performed at [facility]    Report Status PENDING  Incomplete  Culture, blood (routine x 2)     Status: None (Preliminary result)   Collection Time: 02/22/15  4:50 PM  Result Value Ref Range Status   Specimen Description BLOOD ARM LEFT  Final   Special Requests BOTTLES DRAWN AEROBIC AND ANAEROBIC 5CCS  Final   Culture   Final           BLOOD CULTURE RECEIVED NO GROWTH TO DATE CULTURE WILL BE HELD FOR 5 DAYS BEFORE ISSUING A FINAL NEGATIVE REPORT Performed at [facility]    Report Status PENDING  Incomplete  Urine culture     Status: None   Collection Time: 02/22/15  7:10 PM  Result Value Ref Range Status   Specimen Description URINE, CLEAN CATCH  Final   Special Requests Normal  Final   Colony Count NO GROWTH Performed at [facility]   Final   Culture NO GROWTH Performed at [facility]   Final   Report Status 02/23/2015 FINAL  Final  Gram stain     Status: None   Collection Time: 02/22/15 11:09 PM  Result Value Ref Range Status   Specimen Description CSF  Final   Special Requests TUBE 2  Final   Gram Stain   Final    CYTOSPIN SLIDE WBC PRESENT,BOTH PMN AND MONONUCLEAR NO ORGANISMS SEEN    Report Status 02/23/2015 FINAL  Final  CSF culture     Status: None (Preliminary result)   Collection Time: 02/22/15 11:10 PM  Result Value Ref Range Status   Specimen Description CSF  Final   Special Requests TUBE 2  Final   Gram Stain   Final    CYTOSPIN SLIDE WBC PRESENT,BOTH PMN AND MONONUCLEAR NO  ORGANISMS SEEN Performed at [facility] Performed at [facility]    Culture   Final    NO GROWTH 2 DAYS Performed at [facility]    Report Status PENDING  Incomplete  Fungus Culture with Smear     Status: None (Preliminary result)   Collection Time: 02/22/15 11:27 PM  Result Value Ref Range Status   Specimen Description CSF  Final   Special Requests NONE  Final   Fungal Smear   Final    NO YEAST OR FUNGAL ELEMENTS SEEN Performed at [facility]    Culture   Final    CULTURE IN PROGRESS FOR FOUR WEEKS Performed at [facility]    Report Status PENDING  Incomplete     BRIEF HOSPITAL COURSE:    Acute encephalopathy with a rapidly progressive? Neurodegenerative disorder: Etiology remains unknown. Unfortunately patient continues to deteriorate, extensive inpatient and outpatient investigations including multiple MRIs, heavy metal screen, TSH, vitamin B12, RPR, cryptococcal antigen continues to be negative. EEG was negative as well. CSF shows elevated protein, Gram stain/fungal stain negative.Viral/Bacterial CSF cultures were negative.  HSV PCR from CSF negative. Workup for Creuzfeldt-Jakob disease pending. Irrespective of the etiology, patient has had a remarkable decline in her functional status, and is clearly deteriorating. She subsequently started to develop some respiratory muscle fatigue and  labored breathing. This M.D. spoke with multiple family members including spouse, son, daughter-in-law and daughter at bedside on 3/23-since overall very poor prognosis-etiology of underlying neurological disorder remains unknown-and after reviewing patient's living will which patient's husband brought  at bedside- DO NOT RESUSCITATE orders were subsequently entered. Initially on admission, Acyclovir IV was started, however since continued declined, PCR HSV negative this was discontinued on 3/24. Palliative care medicine also consulted. After multiple  discussions with family and palliative care team/this MD, patient was then transitioned to full comfort care measures, as patient continued to decline rather rapidly. She was started on Ativan/Morphine, and referred to residential hospice.  Atrial fibrillation with RVR: New onset, rate controlled with Metoprolol. Cardiology was consulted. Not a candidate for anticoagulation with rapid neurological decline-and poor oral intake.Since transitioned to comfort care, we stopped  telemetry monitoring and stopped Metoprolol aswell   Mildly elevated troponin: Likely secondary to demand ischemia. No further workup necessary as patient has rapid neurological decline. Not a candidate for further work up. Stopped ASA-as hardly any PO intake and family wishing to focus on comfort.   Dysphagia: Secondary to underlying unknown neurological disorder-continue dysphagia 2 diet. Family aware of risks of aspiration. We briefly discussed feeding tube-family does not want to pursue any sort of feeding tube including NG or PEG tube at this time.Ok to start prn comfort feeds.    History of primary hyperparathyroidism-status post parathyroidectomy: Serum calcium remains within normal limits.No further lab monitoring   Essential hypertension: Monitor-will stop all antihypertensives, given continued decline and focus on comfort.    TODAY-DAY OF DISCHARGE:  Subjective:   Nykeria Mealing today remains comfortable, sedated.  Objective:   Blood pressure 122/54, pulse 99, temperature 97.8 F (36.6 C), temperature source Axillary, resp. rate 20, height 5\' 3"  (1.6 m), weight 59.421 kg (131 lb), SpO2 99 %.  Intake/Output Summary (Last 24 hours) at 02/26/15 0722 Last data filed at 02/25/15 1211  Gross per 24 hour  Intake      0 ml  Output      0 ml  Net      0 ml   Filed Weights   02/24/15 2036  Weight: 59.421 kg (131 lb)    Exam Gen Exam: Sedated.Some Labored breathing.  Neck: Supple, No JVD.  Chest: B/L  Clear.  CVS: S1 S2 Regular, no murmurs.  Abdomen: soft, BS +, non tender, non distended.  Extremities: no edema, lower extremities warm to touch.  DISCHARGE CONDITION: Stable-but declining rapidly.  DISPOSITION: Residential Hospice  DISCHARGE INSTRUCTIONS:    Activity:  As tolerated   Diet recommendation: Comfort feeds  Discharge Instructions    Diet general    Complete by:  As directed   Comfort feeds as tolerated     Increase activity slowly    Complete by:  As directed            Follow-up Information    Follow up with Drema Pry, DO.   Specialty:  Internal Medicine   Why:  As needed   Contact information:   [place] [address] [phone]       Total Time spent on discharge equals 45 minutes.  SignedOren Binet 02/26/2015 7:22 AM

## 2015-02-26 NOTE — Clinical Social Work Note (Signed)
Clinical Social Worker facilitated patient discharge including contacting patient family and facility to confirm patient discharge plans.  Clinical information faxed to facility and family agreeable with plan.  CSW arranged ambulance transport via [facility].  RN to call report prior to discharge.  DC packet on chart for transport.  Clinical Social Worker will sign off for now as social work intervention is no longer needed. Please consult [place] again if new need arises.  Glendon Axe, MSW, LCSWA [phone] 02/26/2015 9:42 AM

## 2015-03-01 ENCOUNTER — Ambulatory Visit: Payer: PPO | Admitting: Internal Medicine

## 2015-03-01 LAB — CULTURE, BLOOD (ROUTINE X 2)
CULTURE: NO GROWTH
Culture: NO GROWTH

## 2015-03-02 ENCOUNTER — Telehealth: Payer: Self-pay | Admitting: Neurology

## 2015-03-02 NOTE — Telephone Encounter (Signed)
Called to express sincere condolences to the family after finding out that Ms. Hopson passed away on 03/04/2023, but there was no answer.  I will try again later.  Denesia Donelan K. Posey Pronto, DO

## 2015-03-03 LAB — VIRUS CULTURE

## 2015-03-03 NOTE — Telephone Encounter (Signed)
Called and expressed condolences to patient's husband, who was appreciative of the call.  Pari Lombard K. Posey Pronto, DO

## 2015-03-05 LAB — BETA ISOFORM (CREUTZFELDT-JAKOB DISEASE)

## 2015-03-05 DEATH — deceased

## 2015-03-09 ENCOUNTER — Ambulatory Visit: Payer: PPO | Admitting: Neurology

## 2015-03-22 LAB — FUNGUS CULTURE W SMEAR: FUNGAL SMEAR: NONE SEEN

## 2015-10-04 IMAGING — CT CT HEAD W/O CM
2 series · 16 of 30 positions shown, 20 images · non-contrast
Comparison: MRI brain dated 01/23/2015

CLINICAL DATA: Altered mental status, recent parathyroid surgery

EXAM:
CT HEAD WITHOUT CONTRAST
TECHNIQUE: Contiguous axial images were obtained from the base of the skull
through the vertex without intravenous contrast.

[Series 201: head w/o, idose (1) · axial · non-contrast · 0.49mm/px · z∈[+522,+657]mm · 13 of 33 slices shown, 17 images]
[im 3/33  brain]
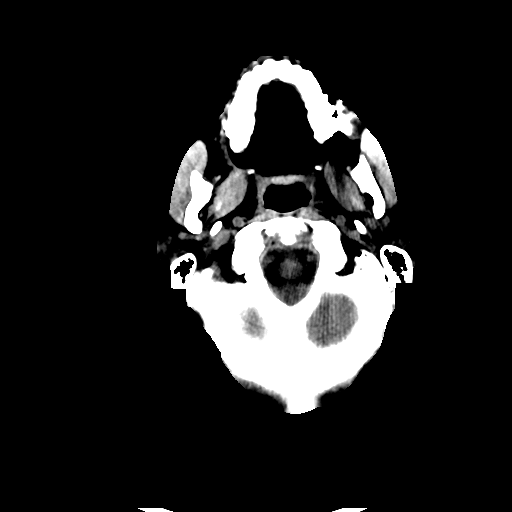
[im 3/33  bone]
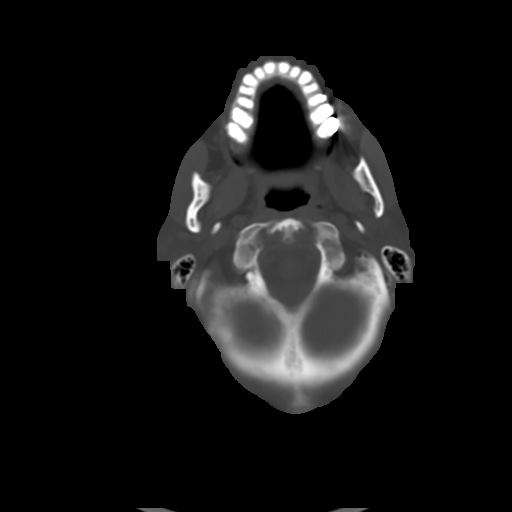
[im 5/33  brain]
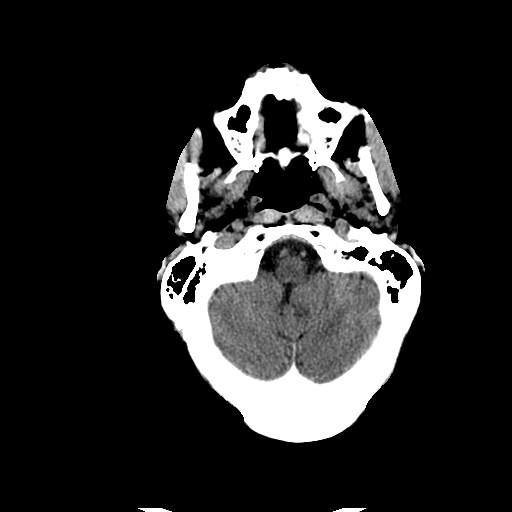
[im 7/33  brain]
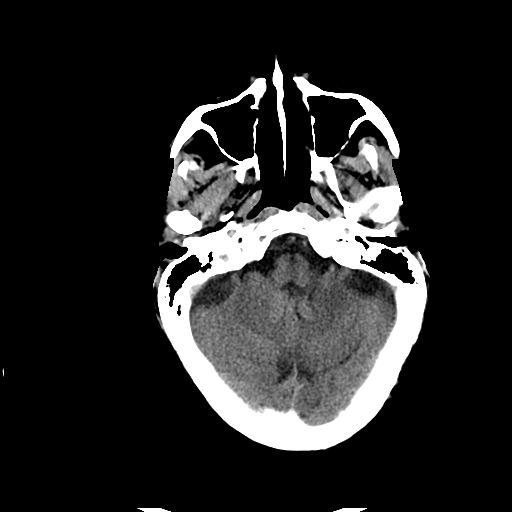
[im 10/33  brain]
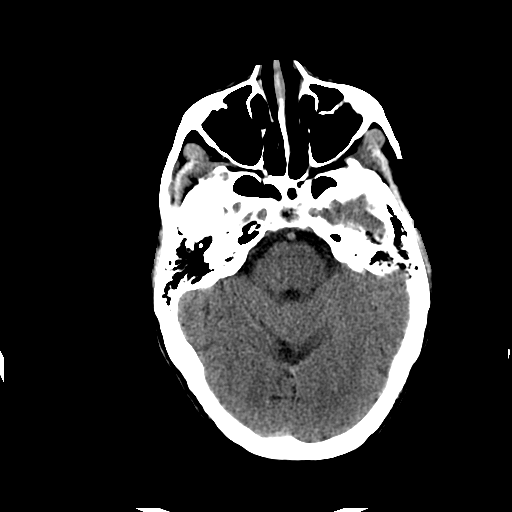
[im 12/33  brain]
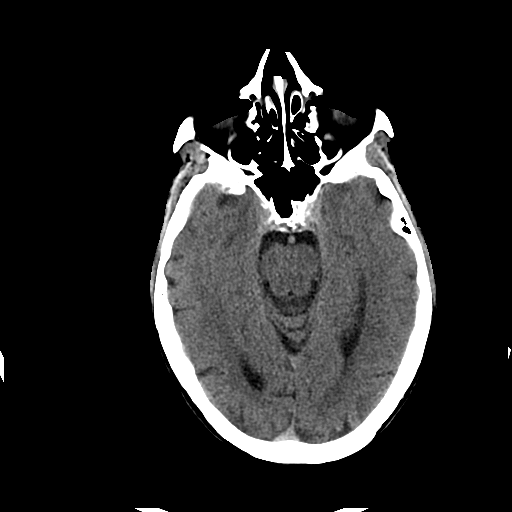
[im 12/33  bone]
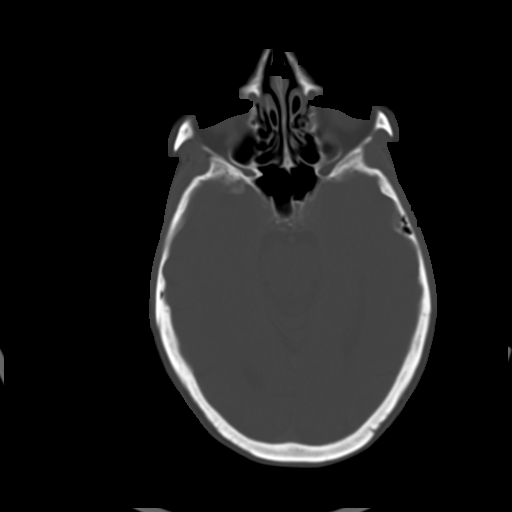
[im 14/33  brain]
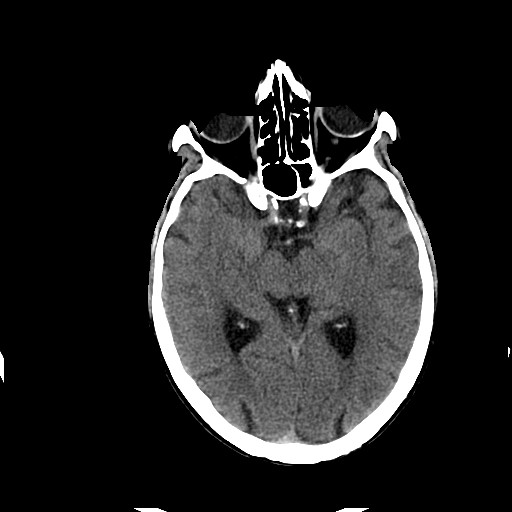
[im 17/33  brain]
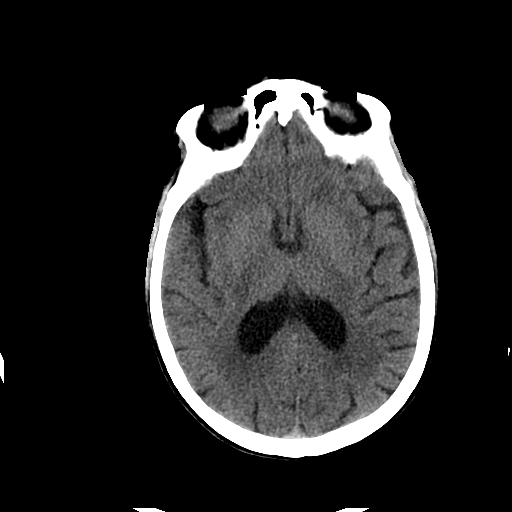
[im 19/33  brain]
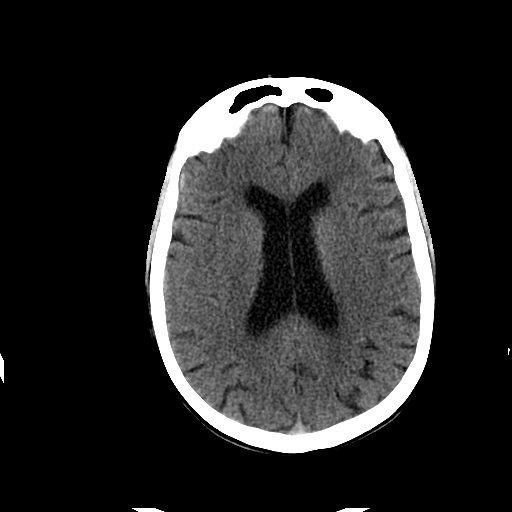
[im 21/33  brain]
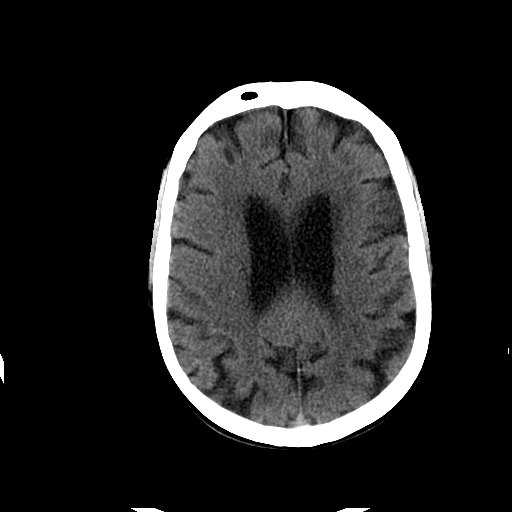
[im 21/33  bone]
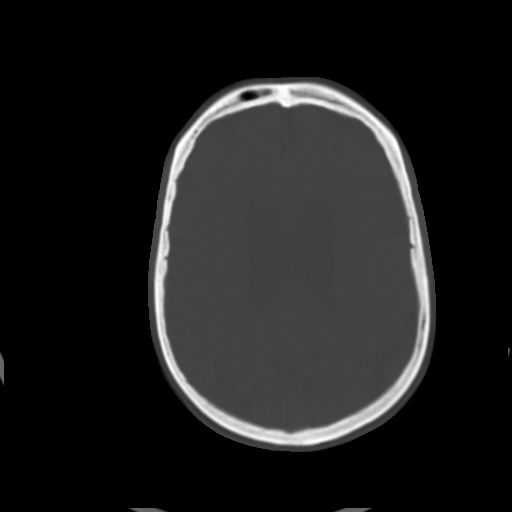
[im 23/33  brain]
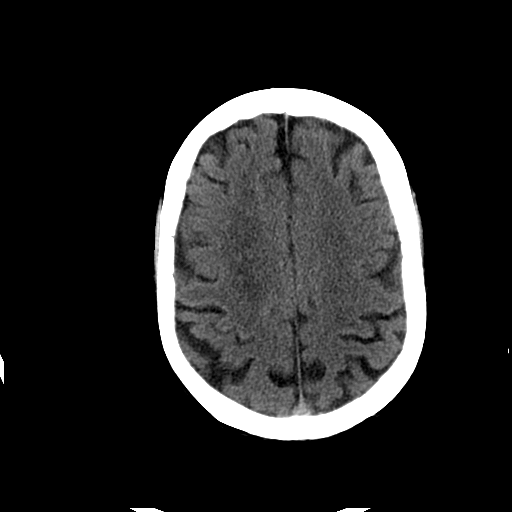
[im 26/33  brain]
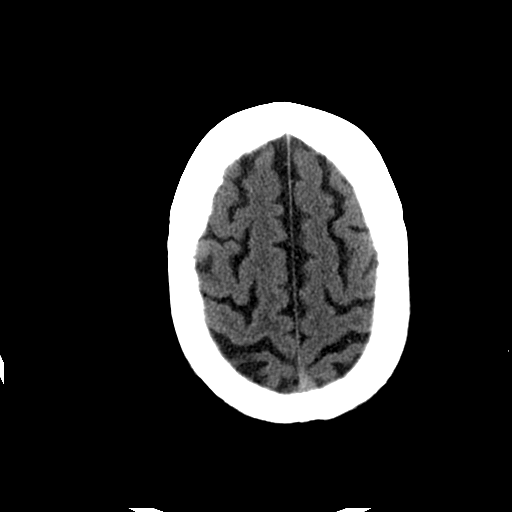
[im 28/33  brain]
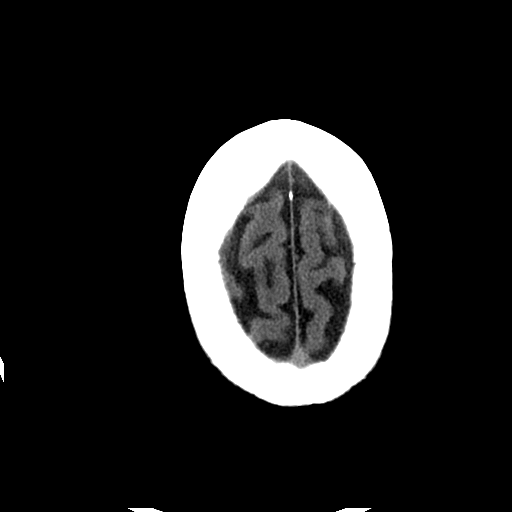
[im 30/33  brain]
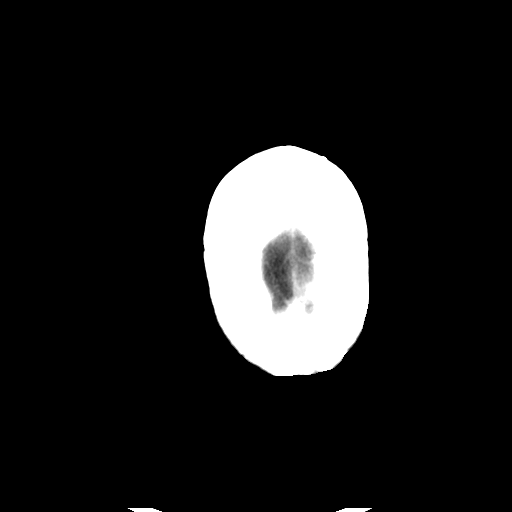
[im 30/33  bone]
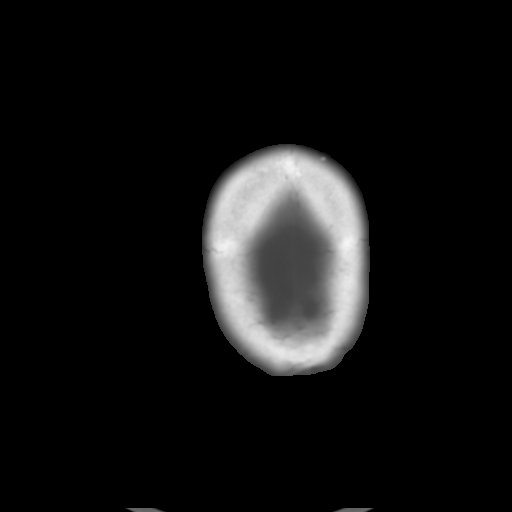

[Series 202: head w/o bone, idose (1) · axial · non-contrast · 0.49mm/px · z∈[+522,+567]mm · 3 of 33 slices shown]
[im 3/33  bone]
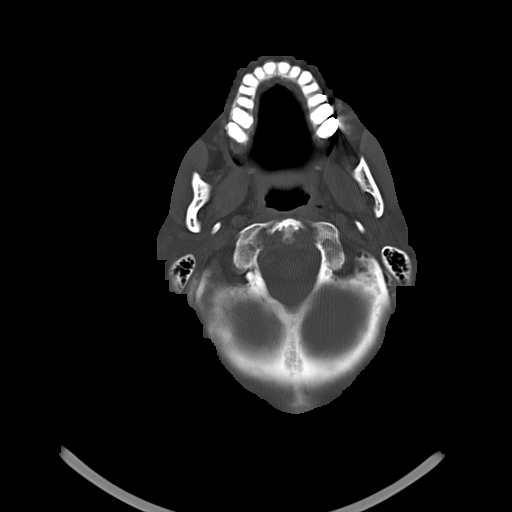
[im 7/33  bone]
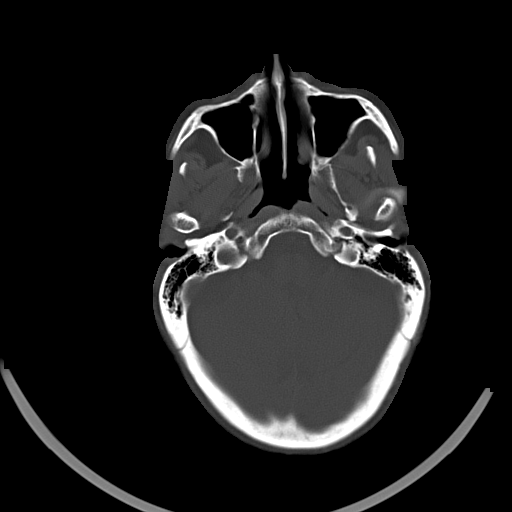
[im 12/33  bone]
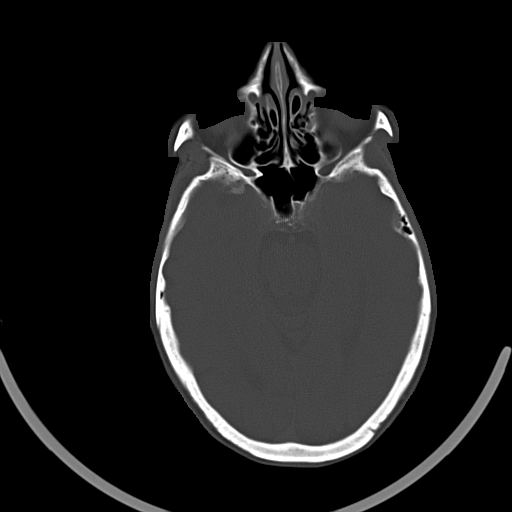

[16 of 30 positions shown; findings below may reference images not displayed]

FINDINGS: No evidence of parenchymal hemorrhage or extra-axial fluid
collection. No mass lesion, mass effect, or midline shift.

No CT evidence of acute infarction.

Subcortical white matter and periventricular small vessel ischemic
changes.

Cerebral volume is within normal limits.  No ventriculomegaly.

The visualized paranasal sinuses are essentially clear. The mastoid
air cells are unopacified.

No evidence of calvarial fracture.
IMPRESSION: No evidence of acute intracranial abnormality.

Atrophy with small vessel ischemic changes.

## 2015-10-04 IMAGING — RF DG FLUORO GUIDE LUMBAR PUNCTURE
1 series · 1 of 1 positions shown · non-contrast
Comparison: none

CLINICAL DATA: Acute onset altered mental status for 1 week.
Initial encounter.

[Series 1: cp_standard · 0.30mm/px · 1 of 1 slices shown]
[im 1/1]
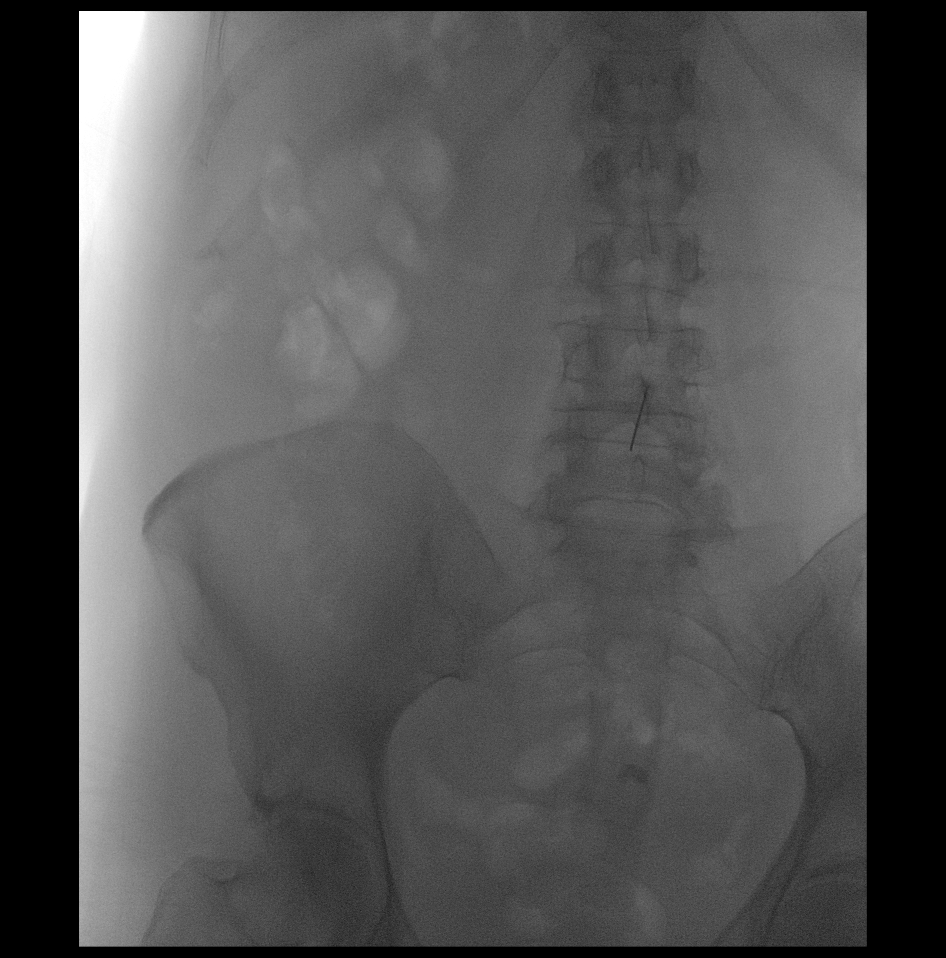

[1 of 1 positions shown; findings below may reference images not displayed]

EXAM:
DIAGNOSTIC LUMBAR PUNCTURE UNDER FLUOROSCOPIC GUIDANCE

FLUOROSCOPY TIME:  Fluoroscopy Time (in minutes and seconds): 18
seconds

Number of Acquired Images:  One saved image

PROCEDURE:
Informed consent was obtained from the patient's daughter prior to
the procedure, including potential complications of headache,
allergy, and pain. With the patient prone, the lower back was
prepped with Betadine. 1% Lidocaine was used for local anesthesia.
Lumbar puncture was performed at the L4-L5 level using a 20 gauge
needle with return of mildly bloody and slightly hazy CSF with a
very low non-measurable opening pressure. 8 mL of CSF were obtained
for laboratory studies. The patient tolerated the procedure well and
there were no apparent complications.
IMPRESSION: Successful lumbar puncture. Collected CSF slightly hazy and mildly
serosanguineous in nature.
